# Patient Record
Sex: Male | Born: 1960
Health system: Southern US, Community
[De-identification: ages and names within clinical notes are randomized; demographics above are authoritative.]

## PROBLEM LIST (undated history)

## (undated) DIAGNOSIS — R87619 Unspecified abnormal cytological findings in specimens from cervix uteri: Secondary | ICD-10-CM

## (undated) DIAGNOSIS — J449 Chronic obstructive pulmonary disease, unspecified: Secondary | ICD-10-CM

## (undated) DIAGNOSIS — Z21 Asymptomatic human immunodeficiency virus [HIV] infection status: Secondary | ICD-10-CM

## (undated) DIAGNOSIS — F1910 Other psychoactive substance abuse, uncomplicated: Secondary | ICD-10-CM

## (undated) DIAGNOSIS — F1021 Alcohol dependence, in remission: Secondary | ICD-10-CM

## (undated) DIAGNOSIS — F329 Major depressive disorder, single episode, unspecified: Secondary | ICD-10-CM

## (undated) DIAGNOSIS — IMO0002 Reserved for concepts with insufficient information to code with codable children: Secondary | ICD-10-CM

## (undated) DIAGNOSIS — D649 Anemia, unspecified: Secondary | ICD-10-CM

## (undated) DIAGNOSIS — L309 Dermatitis, unspecified: Secondary | ICD-10-CM

## (undated) DIAGNOSIS — F32A Depression, unspecified: Secondary | ICD-10-CM

## (undated) DIAGNOSIS — R0789 Other chest pain: Secondary | ICD-10-CM

## (undated) DIAGNOSIS — C801 Malignant (primary) neoplasm, unspecified: Secondary | ICD-10-CM

## (undated) DIAGNOSIS — G709 Myoneural disorder, unspecified: Secondary | ICD-10-CM

## (undated) DIAGNOSIS — B20 Human immunodeficiency virus [HIV] disease: Secondary | ICD-10-CM

## (undated) DIAGNOSIS — R011 Cardiac murmur, unspecified: Secondary | ICD-10-CM

## (undated) HISTORY — DX: Dermatitis, unspecified: L30.9

## (undated) HISTORY — DX: Myoneural disorder, unspecified: G70.9

## (undated) HISTORY — DX: Major depressive disorder, single episode, unspecified: F32.9

## (undated) HISTORY — DX: Malignant (primary) neoplasm, unspecified: C80.1

## (undated) HISTORY — DX: Reserved for concepts with insufficient information to code with codable children: IMO0002

## (undated) HISTORY — DX: Depression, unspecified: F32.A

## (undated) HISTORY — DX: Other psychoactive substance abuse, uncomplicated: F19.10

## (undated) HISTORY — DX: Alcohol dependence, in remission: F10.21

## (undated) HISTORY — DX: Anemia, unspecified: D64.9

## (undated) HISTORY — PX: ANOSCOPY: SHX299

## (undated) HISTORY — DX: Unspecified abnormal cytological findings in specimens from cervix uteri: R87.619

## (undated) HISTORY — DX: Other chest pain: R07.89

---

## 2002-12-01 ENCOUNTER — Encounter (INDEPENDENT_AMBULATORY_CARE_PROVIDER_SITE_OTHER): Payer: Self-pay | Admitting: *Deleted

## 2002-12-01 ENCOUNTER — Encounter: Admission: RE | Admit: 2002-12-01 | Discharge: 2002-12-01 | Payer: Self-pay | Admitting: Infectious Diseases

## 2002-12-01 ENCOUNTER — Ambulatory Visit (HOSPITAL_COMMUNITY): Admission: RE | Admit: 2002-12-01 | Discharge: 2002-12-01 | Payer: Self-pay | Admitting: Infectious Diseases

## 2002-12-01 ENCOUNTER — Encounter (INDEPENDENT_AMBULATORY_CARE_PROVIDER_SITE_OTHER): Payer: Self-pay | Admitting: Infectious Diseases

## 2002-12-01 LAB — CONVERTED CEMR LAB
CD4 Count: 190 microliters
CD4 T Cell Abs: 190

## 2002-12-08 ENCOUNTER — Encounter: Admission: RE | Admit: 2002-12-08 | Discharge: 2002-12-08 | Payer: Self-pay | Admitting: Infectious Diseases

## 2002-12-08 ENCOUNTER — Ambulatory Visit (HOSPITAL_COMMUNITY): Admission: RE | Admit: 2002-12-08 | Discharge: 2002-12-08 | Payer: Self-pay | Admitting: Infectious Diseases

## 2002-12-08 ENCOUNTER — Encounter (INDEPENDENT_AMBULATORY_CARE_PROVIDER_SITE_OTHER): Payer: Self-pay | Admitting: Infectious Diseases

## 2003-01-05 ENCOUNTER — Encounter (INDEPENDENT_AMBULATORY_CARE_PROVIDER_SITE_OTHER): Payer: Self-pay | Admitting: Infectious Diseases

## 2003-01-05 ENCOUNTER — Encounter: Admission: RE | Admit: 2003-01-05 | Discharge: 2003-01-05 | Payer: Self-pay | Admitting: Infectious Diseases

## 2003-01-05 ENCOUNTER — Ambulatory Visit (HOSPITAL_COMMUNITY): Admission: RE | Admit: 2003-01-05 | Discharge: 2003-01-05 | Payer: Self-pay | Admitting: Infectious Diseases

## 2003-01-07 ENCOUNTER — Encounter: Admission: RE | Admit: 2003-01-07 | Discharge: 2003-01-07 | Payer: Self-pay | Admitting: Infectious Diseases

## 2003-01-16 ENCOUNTER — Encounter: Admission: RE | Admit: 2003-01-16 | Discharge: 2003-01-16 | Payer: Self-pay | Admitting: Infectious Diseases

## 2003-01-30 ENCOUNTER — Emergency Department (HOSPITAL_COMMUNITY): Admission: AD | Admit: 2003-01-30 | Discharge: 2003-01-30 | Payer: Self-pay | Admitting: Family Medicine

## 2003-03-11 ENCOUNTER — Encounter (INDEPENDENT_AMBULATORY_CARE_PROVIDER_SITE_OTHER): Payer: Self-pay | Admitting: *Deleted

## 2003-03-11 ENCOUNTER — Encounter: Admission: RE | Admit: 2003-03-11 | Discharge: 2003-03-11 | Payer: Self-pay | Admitting: Internal Medicine

## 2003-03-11 ENCOUNTER — Encounter: Payer: Self-pay | Admitting: Infectious Diseases

## 2003-03-11 ENCOUNTER — Ambulatory Visit (HOSPITAL_COMMUNITY): Admission: RE | Admit: 2003-03-11 | Discharge: 2003-03-11 | Payer: Self-pay | Admitting: Infectious Diseases

## 2003-05-06 ENCOUNTER — Encounter: Admission: RE | Admit: 2003-05-06 | Discharge: 2003-05-06 | Payer: Self-pay | Admitting: Infectious Diseases

## 2003-06-04 ENCOUNTER — Ambulatory Visit (HOSPITAL_COMMUNITY): Admission: RE | Admit: 2003-06-04 | Discharge: 2003-06-04 | Payer: Self-pay | Admitting: Infectious Diseases

## 2003-06-04 ENCOUNTER — Encounter: Admission: RE | Admit: 2003-06-04 | Discharge: 2003-06-04 | Payer: Self-pay | Admitting: Infectious Diseases

## 2003-06-04 ENCOUNTER — Encounter (INDEPENDENT_AMBULATORY_CARE_PROVIDER_SITE_OTHER): Payer: Self-pay | Admitting: *Deleted

## 2004-03-26 ENCOUNTER — Emergency Department (HOSPITAL_COMMUNITY): Admission: EM | Admit: 2004-03-26 | Discharge: 2004-03-26 | Payer: Self-pay | Admitting: Emergency Medicine

## 2004-04-01 ENCOUNTER — Emergency Department (HOSPITAL_COMMUNITY): Admission: EM | Admit: 2004-04-01 | Discharge: 2004-04-01 | Payer: Self-pay | Admitting: Emergency Medicine

## 2005-03-08 ENCOUNTER — Ambulatory Visit: Payer: Self-pay | Admitting: Infectious Diseases

## 2005-03-08 ENCOUNTER — Encounter (INDEPENDENT_AMBULATORY_CARE_PROVIDER_SITE_OTHER): Payer: Self-pay | Admitting: *Deleted

## 2005-03-08 ENCOUNTER — Ambulatory Visit (HOSPITAL_COMMUNITY): Admission: RE | Admit: 2005-03-08 | Discharge: 2005-03-08 | Payer: Self-pay | Admitting: Infectious Diseases

## 2005-04-10 ENCOUNTER — Ambulatory Visit: Payer: Self-pay | Admitting: Infectious Diseases

## 2006-01-13 DIAGNOSIS — B20 Human immunodeficiency virus [HIV] disease: Secondary | ICD-10-CM | POA: Insufficient documentation

## 2006-05-07 ENCOUNTER — Encounter (INDEPENDENT_AMBULATORY_CARE_PROVIDER_SITE_OTHER): Payer: Self-pay | Admitting: *Deleted

## 2006-05-07 LAB — CONVERTED CEMR LAB

## 2006-05-18 ENCOUNTER — Telehealth (INDEPENDENT_AMBULATORY_CARE_PROVIDER_SITE_OTHER): Payer: Self-pay | Admitting: Infectious Diseases

## 2006-05-20 ENCOUNTER — Encounter (INDEPENDENT_AMBULATORY_CARE_PROVIDER_SITE_OTHER): Payer: Self-pay | Admitting: *Deleted

## 2006-06-26 ENCOUNTER — Encounter (INDEPENDENT_AMBULATORY_CARE_PROVIDER_SITE_OTHER): Payer: Self-pay | Admitting: *Deleted

## 2006-07-09 ENCOUNTER — Telehealth (INDEPENDENT_AMBULATORY_CARE_PROVIDER_SITE_OTHER): Payer: Self-pay | Admitting: Infectious Diseases

## 2006-09-17 ENCOUNTER — Encounter (INDEPENDENT_AMBULATORY_CARE_PROVIDER_SITE_OTHER): Payer: Self-pay | Admitting: *Deleted

## 2006-12-10 ENCOUNTER — Ambulatory Visit: Payer: Self-pay | Admitting: Infectious Disease

## 2006-12-10 ENCOUNTER — Encounter: Admission: RE | Admit: 2006-12-10 | Discharge: 2006-12-10 | Payer: Self-pay | Admitting: Infectious Disease

## 2006-12-10 DIAGNOSIS — F329 Major depressive disorder, single episode, unspecified: Secondary | ICD-10-CM

## 2006-12-10 DIAGNOSIS — C460 Kaposi's sarcoma of skin: Secondary | ICD-10-CM

## 2006-12-10 LAB — CONVERTED CEMR LAB
AST: 31 units/L (ref 0–37)
Albumin: 4.1 g/dL (ref 3.5–5.2)
BUN: 18 mg/dL (ref 6–23)
Basophils Relative: 0 % (ref 0–1)
CO2: 24 meq/L (ref 19–32)
Calcium: 9.3 mg/dL (ref 8.4–10.5)
Chloride: 103 meq/L (ref 96–112)
Creatinine, Ser: 0.96 mg/dL (ref 0.40–1.50)
Lymphocytes Relative: 64 % — ABNORMAL HIGH (ref 12–46)
MCHC: 32.7 g/dL (ref 30.0–36.0)
MCV: 98.5 fL (ref 78.0–100.0)
Potassium: 3.7 meq/L (ref 3.5–5.3)
RBC: 3.97 M/uL — ABNORMAL LOW (ref 4.22–5.81)
RDW: 12.8 % (ref 11.5–14.0)
Total Bilirubin: 1.2 mg/dL (ref 0.3–1.2)
WBC: 2.7 10*3/uL — ABNORMAL LOW (ref 4.0–10.5)

## 2006-12-13 ENCOUNTER — Ambulatory Visit (HOSPITAL_COMMUNITY): Admission: RE | Admit: 2006-12-13 | Discharge: 2006-12-13 | Payer: Self-pay | Admitting: Infectious Disease

## 2006-12-14 ENCOUNTER — Telehealth: Payer: Self-pay | Admitting: Infectious Disease

## 2006-12-24 ENCOUNTER — Telehealth: Payer: Self-pay

## 2007-01-10 ENCOUNTER — Ambulatory Visit: Payer: Self-pay | Admitting: Infectious Diseases

## 2007-01-10 ENCOUNTER — Encounter: Admission: RE | Admit: 2007-01-10 | Discharge: 2007-01-10 | Payer: Self-pay | Admitting: Infectious Diseases

## 2007-01-10 LAB — CONVERTED CEMR LAB: HIV-1 RNA Quant, Log: 3.15 — ABNORMAL HIGH (ref <1.70)

## 2007-01-14 ENCOUNTER — Telehealth: Payer: Self-pay | Admitting: Infectious Disease

## 2007-01-24 ENCOUNTER — Ambulatory Visit: Payer: Self-pay | Admitting: Infectious Disease

## 2007-01-24 DIAGNOSIS — L299 Pruritus, unspecified: Secondary | ICD-10-CM

## 2007-01-24 DIAGNOSIS — R197 Diarrhea, unspecified: Secondary | ICD-10-CM

## 2007-01-28 ENCOUNTER — Telehealth: Payer: Self-pay | Admitting: Infectious Disease

## 2007-01-31 ENCOUNTER — Ambulatory Visit: Payer: Self-pay | Admitting: Infectious Disease

## 2007-02-13 ENCOUNTER — Telehealth: Payer: Self-pay | Admitting: Infectious Disease

## 2007-02-18 ENCOUNTER — Telehealth: Payer: Self-pay | Admitting: Infectious Disease

## 2007-03-11 ENCOUNTER — Encounter: Admission: RE | Admit: 2007-03-11 | Discharge: 2007-03-11 | Payer: Self-pay | Admitting: Infectious Disease

## 2007-03-11 ENCOUNTER — Ambulatory Visit: Payer: Self-pay | Admitting: Infectious Disease

## 2007-03-11 LAB — CONVERTED CEMR LAB
Albumin: 4.3 g/dL (ref 3.5–5.2)
BUN: 22 mg/dL (ref 6–23)
Basophils Absolute: 0 10*3/uL (ref 0.0–0.1)
Basophils Relative: 0 % (ref 0–1)
CO2: 26 meq/L (ref 19–32)
Chloride: 105 meq/L (ref 96–112)
Creatinine, Ser: 1.14 mg/dL (ref 0.40–1.50)
Eosinophils Absolute: 0 10*3/uL (ref 0.0–0.7)
Glucose, Bld: 88 mg/dL (ref 70–99)
HCT: 37.8 % — ABNORMAL LOW (ref 39.0–52.0)
HDL: 78 mg/dL (ref >39)
HIV 1 RNA Quant: 240 copies/mL — ABNORMAL HIGH (ref <50)
HIV-1 RNA Quant, Log: 2.38 — ABNORMAL HIGH (ref <1.70)
Hemoglobin: 12.8 g/dL — ABNORMAL LOW (ref 13.0–17.0)
LDL Cholesterol: 128 mg/dL — ABNORMAL HIGH (ref 0–99)
Lymphs Abs: 1.3 10*3/uL (ref 0.7–4.0)
MCHC: 33.9 g/dL (ref 30.0–36.0)
MCV: 99.5 fL (ref 78.0–100.0)
Monocytes Relative: 8 % (ref 3–12)
Neutrophils Relative %: 36 % — ABNORMAL LOW (ref 43–77)
Platelets: 214 10*3/uL (ref 150–400)
Potassium: 4.6 meq/L (ref 3.5–5.3)
RBC: 3.8 M/uL — ABNORMAL LOW (ref 4.22–5.81)
RDW: 13.2 % (ref 11.5–15.5)
Triglycerides: 128 mg/dL (ref <150)

## 2007-03-13 ENCOUNTER — Encounter (INDEPENDENT_AMBULATORY_CARE_PROVIDER_SITE_OTHER): Payer: Self-pay | Admitting: *Deleted

## 2007-03-22 ENCOUNTER — Telehealth: Payer: Self-pay | Admitting: Infectious Disease

## 2007-04-01 ENCOUNTER — Ambulatory Visit: Payer: Self-pay | Admitting: Infectious Disease

## 2007-04-01 DIAGNOSIS — R0989 Other specified symptoms and signs involving the circulatory and respiratory systems: Secondary | ICD-10-CM

## 2007-04-01 DIAGNOSIS — R0609 Other forms of dyspnea: Secondary | ICD-10-CM

## 2007-04-23 ENCOUNTER — Telehealth: Payer: Self-pay | Admitting: Infectious Disease

## 2007-05-03 ENCOUNTER — Encounter (INDEPENDENT_AMBULATORY_CARE_PROVIDER_SITE_OTHER): Payer: Self-pay | Admitting: *Deleted

## 2007-05-23 ENCOUNTER — Telehealth: Payer: Self-pay | Admitting: Infectious Disease

## 2007-05-24 ENCOUNTER — Encounter (INDEPENDENT_AMBULATORY_CARE_PROVIDER_SITE_OTHER): Payer: Self-pay | Admitting: *Deleted

## 2007-06-18 ENCOUNTER — Telehealth (INDEPENDENT_AMBULATORY_CARE_PROVIDER_SITE_OTHER): Payer: Self-pay | Admitting: *Deleted

## 2007-07-01 ENCOUNTER — Encounter: Admission: RE | Admit: 2007-07-01 | Discharge: 2007-07-01 | Payer: Self-pay | Admitting: Infectious Disease

## 2007-07-01 ENCOUNTER — Ambulatory Visit: Payer: Self-pay | Admitting: Infectious Disease

## 2007-07-01 LAB — CONVERTED CEMR LAB
ALT: 21 units/L (ref 0–53)
AST: 32 units/L (ref 0–37)
Albumin: 4.4 g/dL (ref 3.5–5.2)
BUN: 17 mg/dL (ref 6–23)
Basophils Absolute: 0 10*3/uL (ref 0.0–0.1)
Basophils Relative: 0 % (ref 0–1)
CO2: 27 meq/L (ref 19–32)
Calcium: 9.7 mg/dL (ref 8.4–10.5)
Creatinine, Ser: 1.22 mg/dL (ref 0.40–1.50)
Eosinophils Absolute: 0 10*3/uL (ref 0.0–0.7)
Eosinophils Relative: 0 % (ref 0–5)
Glucose, Bld: 92 mg/dL (ref 70–99)
HCT: 36 % — ABNORMAL LOW (ref 39.0–52.0)
HDL: 93 mg/dL (ref >39)
Neutrophils Relative %: 46 % (ref 43–77)
Potassium: 4.4 meq/L (ref 3.5–5.3)
RDW: 13.2 % (ref 11.5–15.5)
Total Protein: 8.5 g/dL — ABNORMAL HIGH (ref 6.0–8.3)
VLDL: 8 mg/dL (ref 0–40)
WBC: 2.9 10*3/uL — ABNORMAL LOW (ref 4.0–10.5)

## 2007-07-11 ENCOUNTER — Telehealth (INDEPENDENT_AMBULATORY_CARE_PROVIDER_SITE_OTHER): Payer: Self-pay | Admitting: *Deleted

## 2007-07-12 ENCOUNTER — Telehealth: Payer: Self-pay | Admitting: Infectious Disease

## 2007-07-17 ENCOUNTER — Encounter: Payer: Self-pay | Admitting: Infectious Disease

## 2007-07-22 ENCOUNTER — Ambulatory Visit: Payer: Self-pay | Admitting: Infectious Disease

## 2007-07-22 DIAGNOSIS — R609 Edema, unspecified: Secondary | ICD-10-CM

## 2007-07-22 DIAGNOSIS — L719 Rosacea, unspecified: Secondary | ICD-10-CM | POA: Insufficient documentation

## 2007-07-24 ENCOUNTER — Telehealth: Payer: Self-pay | Admitting: Infectious Disease

## 2007-07-25 ENCOUNTER — Telehealth: Payer: Self-pay | Admitting: Infectious Disease

## 2007-07-25 ENCOUNTER — Encounter: Payer: Self-pay | Admitting: Infectious Disease

## 2007-07-29 ENCOUNTER — Telehealth (INDEPENDENT_AMBULATORY_CARE_PROVIDER_SITE_OTHER): Payer: Self-pay | Admitting: *Deleted

## 2007-08-02 ENCOUNTER — Encounter: Payer: Self-pay | Admitting: Infectious Disease

## 2007-08-02 ENCOUNTER — Ambulatory Visit (HOSPITAL_COMMUNITY): Admission: RE | Admit: 2007-08-02 | Discharge: 2007-08-02 | Payer: Self-pay | Admitting: Infectious Disease

## 2007-08-02 ENCOUNTER — Ambulatory Visit: Payer: Self-pay | Admitting: Surgery

## 2007-08-16 ENCOUNTER — Telehealth (INDEPENDENT_AMBULATORY_CARE_PROVIDER_SITE_OTHER): Payer: Self-pay | Admitting: *Deleted

## 2007-09-10 ENCOUNTER — Telehealth (INDEPENDENT_AMBULATORY_CARE_PROVIDER_SITE_OTHER): Payer: Self-pay | Admitting: *Deleted

## 2007-09-17 ENCOUNTER — Telehealth (INDEPENDENT_AMBULATORY_CARE_PROVIDER_SITE_OTHER): Payer: Self-pay | Admitting: *Deleted

## 2007-10-01 ENCOUNTER — Telehealth: Payer: Self-pay | Admitting: Infectious Disease

## 2007-10-17 ENCOUNTER — Telehealth (INDEPENDENT_AMBULATORY_CARE_PROVIDER_SITE_OTHER): Payer: Self-pay | Admitting: *Deleted

## 2007-11-08 ENCOUNTER — Emergency Department (HOSPITAL_COMMUNITY): Admission: EM | Admit: 2007-11-08 | Discharge: 2007-11-08 | Payer: Self-pay | Admitting: Emergency Medicine

## 2007-11-12 ENCOUNTER — Telehealth (INDEPENDENT_AMBULATORY_CARE_PROVIDER_SITE_OTHER): Payer: Self-pay | Admitting: *Deleted

## 2007-12-02 ENCOUNTER — Ambulatory Visit: Payer: Self-pay | Admitting: Infectious Disease

## 2007-12-02 LAB — CONVERTED CEMR LAB
AST: 39 units/L — ABNORMAL HIGH (ref 0–37)
Alkaline Phosphatase: 84 units/L (ref 39–117)
Basophils Absolute: 0 10*3/uL (ref 0.0–0.1)
Basophils Relative: 0 % (ref 0–1)
Calcium: 8.9 mg/dL (ref 8.4–10.5)
Eosinophils Absolute: 0 10*3/uL (ref 0.0–0.7)
Eosinophils Relative: 1 % (ref 0–5)
HIV 1 RNA Quant: 357 copies/mL — ABNORMAL HIGH (ref <50)
HIV-1 RNA Quant, Log: 2.55 — ABNORMAL HIGH (ref <1.70)
LDL Cholesterol: 149 mg/dL — ABNORMAL HIGH (ref 0–99)
Lymphocytes Relative: 28 % (ref 12–46)
Lymphs Abs: 1 10*3/uL (ref 0.7–4.0)
MCHC: 31.7 g/dL (ref 30.0–36.0)
Monocytes Absolute: 0.3 10*3/uL (ref 0.1–1.0)
Neutro Abs: 2.3 10*3/uL (ref 1.7–7.7)
Neutrophils Relative %: 64 % (ref 43–77)
Platelets: 248 10*3/uL (ref 150–400)
Potassium: 4.3 meq/L (ref 3.5–5.3)
RDW: 12.8 % (ref 11.5–15.5)
Sodium: 138 meq/L (ref 135–145)
Total Protein: 8.1 g/dL (ref 6.0–8.3)
Triglycerides: 98 mg/dL (ref <150)
VLDL: 20 mg/dL (ref 0–40)
WBC: 3.6 10*3/uL — ABNORMAL LOW (ref 4.0–10.5)

## 2007-12-16 ENCOUNTER — Telehealth (INDEPENDENT_AMBULATORY_CARE_PROVIDER_SITE_OTHER): Payer: Self-pay | Admitting: *Deleted

## 2008-01-13 ENCOUNTER — Telehealth (INDEPENDENT_AMBULATORY_CARE_PROVIDER_SITE_OTHER): Payer: Self-pay | Admitting: *Deleted

## 2008-02-13 ENCOUNTER — Telehealth (INDEPENDENT_AMBULATORY_CARE_PROVIDER_SITE_OTHER): Payer: Self-pay | Admitting: *Deleted

## 2008-03-04 ENCOUNTER — Ambulatory Visit: Payer: Self-pay | Admitting: Infectious Disease

## 2008-03-04 LAB — CONVERTED CEMR LAB
ALT: 21 units/L (ref 0–53)
Albumin: 4.2 g/dL (ref 3.5–5.2)
Basophils Relative: 1 % (ref 0–1)
CO2: 25 meq/L (ref 19–32)
Calcium: 9.4 mg/dL (ref 8.4–10.5)
Chloride: 102 meq/L (ref 96–112)
Creatinine, Ser: 1.09 mg/dL (ref 0.40–1.50)
Eosinophils Absolute: 0 10*3/uL (ref 0.0–0.7)
Eosinophils Relative: 1 % (ref 0–5)
Glucose, Bld: 81 mg/dL (ref 70–99)
HCT: 39.3 % (ref 39.0–52.0)
Hemoglobin: 13.1 g/dL (ref 13.0–17.0)
Lymphocytes Relative: 52 % — ABNORMAL HIGH (ref 12–46)
MCHC: 33.3 g/dL (ref 30.0–36.0)
MCV: 100.5 fL — ABNORMAL HIGH (ref 78.0–100.0)
Monocytes Relative: 10 % (ref 3–12)
Potassium: 4.6 meq/L (ref 3.5–5.3)
RDW: 13 % (ref 11.5–15.5)
Total Protein: 8 g/dL (ref 6.0–8.3)

## 2008-03-11 ENCOUNTER — Telehealth (INDEPENDENT_AMBULATORY_CARE_PROVIDER_SITE_OTHER): Payer: Self-pay | Admitting: *Deleted

## 2008-03-16 ENCOUNTER — Ambulatory Visit: Payer: Self-pay | Admitting: Infectious Disease

## 2008-03-16 DIAGNOSIS — D72819 Decreased white blood cell count, unspecified: Secondary | ICD-10-CM

## 2008-03-16 LAB — CONVERTED CEMR LAB: Chlamydia, Swab/Urine, PCR: NEGATIVE

## 2008-04-01 ENCOUNTER — Ambulatory Visit: Payer: Self-pay | Admitting: Internal Medicine

## 2008-04-08 ENCOUNTER — Encounter: Payer: Self-pay | Admitting: Infectious Disease

## 2008-04-08 LAB — CBC WITH DIFFERENTIAL/PLATELET
Eosinophils Absolute: 0 10*3/uL (ref 0.0–0.5)
MCV: 100.3 fL — ABNORMAL HIGH (ref 81.6–98.0)
MONO%: 10.3 % (ref 0.0–13.0)
NEUT#: 0.8 10*3/uL — ABNORMAL LOW (ref 1.5–6.5)
RBC: 3.6 10*6/uL — ABNORMAL LOW (ref 4.20–5.71)
RDW: 13.2 % (ref 11.2–14.6)
WBC: 2.5 10*3/uL — ABNORMAL LOW (ref 4.0–10.0)

## 2008-04-08 LAB — COMPREHENSIVE METABOLIC PANEL
AST: 27 U/L (ref 0–37)
Albumin: 4.3 g/dL (ref 3.5–5.2)
Alkaline Phosphatase: 93 U/L (ref 39–117)
Glucose, Bld: 86 mg/dL (ref 70–99)
Potassium: 4.2 mEq/L (ref 3.5–5.3)
Sodium: 135 mEq/L (ref 135–145)
Total Protein: 8.2 g/dL (ref 6.0–8.3)

## 2008-04-10 ENCOUNTER — Telehealth (INDEPENDENT_AMBULATORY_CARE_PROVIDER_SITE_OTHER): Payer: Self-pay | Admitting: *Deleted

## 2008-04-16 ENCOUNTER — Ambulatory Visit (HOSPITAL_COMMUNITY): Admission: RE | Admit: 2008-04-16 | Discharge: 2008-04-16 | Payer: Self-pay | Admitting: Internal Medicine

## 2008-04-16 ENCOUNTER — Encounter (INDEPENDENT_AMBULATORY_CARE_PROVIDER_SITE_OTHER): Payer: Self-pay | Admitting: Interventional Radiology

## 2008-04-22 ENCOUNTER — Encounter: Payer: Self-pay | Admitting: Infectious Disease

## 2008-05-07 ENCOUNTER — Telehealth (INDEPENDENT_AMBULATORY_CARE_PROVIDER_SITE_OTHER): Payer: Self-pay | Admitting: *Deleted

## 2008-06-03 ENCOUNTER — Telehealth (INDEPENDENT_AMBULATORY_CARE_PROVIDER_SITE_OTHER): Payer: Self-pay | Admitting: *Deleted

## 2008-06-04 ENCOUNTER — Encounter (INDEPENDENT_AMBULATORY_CARE_PROVIDER_SITE_OTHER): Payer: Self-pay | Admitting: *Deleted

## 2008-06-17 ENCOUNTER — Ambulatory Visit: Payer: Self-pay | Admitting: Infectious Disease

## 2008-06-17 LAB — CONVERTED CEMR LAB
Albumin: 4.4 g/dL (ref 3.5–5.2)
Alkaline Phosphatase: 97 units/L (ref 39–117)
BUN: 17 mg/dL (ref 6–23)
Basophils Absolute: 0 10*3/uL (ref 0.0–0.1)
Basophils Relative: 0 % (ref 0–1)
Chloride: 103 meq/L (ref 96–112)
Creatinine, Ser: 1.22 mg/dL (ref 0.40–1.50)
Eosinophils Absolute: 0 10*3/uL (ref 0.0–0.7)
Eosinophils Relative: 1 % (ref 0–5)
Glucose, Bld: 91 mg/dL (ref 70–99)
HDL: 80 mg/dL (ref >39)
Lymphocytes Relative: 47 % — ABNORMAL HIGH (ref 12–46)
Lymphs Abs: 1.2 10*3/uL (ref 0.7–4.0)
MCHC: 34.4 g/dL (ref 30.0–36.0)
Monocytes Absolute: 0.4 10*3/uL (ref 0.1–1.0)
Neutro Abs: 1 10*3/uL — ABNORMAL LOW (ref 1.7–7.7)
Neutrophils Relative %: 38 % — ABNORMAL LOW (ref 43–77)
Potassium: 4.3 meq/L (ref 3.5–5.3)
RBC: 3.54 M/uL — ABNORMAL LOW (ref 4.22–5.81)
Sodium: 140 meq/L (ref 135–145)
Total Protein: 8 g/dL (ref 6.0–8.3)
Triglycerides: 71 mg/dL (ref <150)
VLDL: 14 mg/dL (ref 0–40)

## 2008-06-19 ENCOUNTER — Ambulatory Visit: Payer: Self-pay | Admitting: Internal Medicine

## 2008-06-19 ENCOUNTER — Encounter: Payer: Self-pay | Admitting: Infectious Disease

## 2008-06-19 DIAGNOSIS — L0293 Carbuncle, unspecified: Secondary | ICD-10-CM

## 2008-06-19 DIAGNOSIS — L0292 Furuncle, unspecified: Secondary | ICD-10-CM | POA: Insufficient documentation

## 2008-06-23 ENCOUNTER — Encounter (INDEPENDENT_AMBULATORY_CARE_PROVIDER_SITE_OTHER): Payer: Self-pay | Admitting: *Deleted

## 2008-07-03 ENCOUNTER — Telehealth (INDEPENDENT_AMBULATORY_CARE_PROVIDER_SITE_OTHER): Payer: Self-pay | Admitting: *Deleted

## 2008-08-05 ENCOUNTER — Telehealth (INDEPENDENT_AMBULATORY_CARE_PROVIDER_SITE_OTHER): Payer: Self-pay | Admitting: *Deleted

## 2008-09-08 ENCOUNTER — Telehealth (INDEPENDENT_AMBULATORY_CARE_PROVIDER_SITE_OTHER): Payer: Self-pay | Admitting: *Deleted

## 2008-09-09 ENCOUNTER — Ambulatory Visit: Payer: Self-pay | Admitting: Infectious Disease

## 2008-09-09 LAB — CONVERTED CEMR LAB
ALT: 19 units/L (ref 0–53)
Albumin: 4.2 g/dL (ref 3.5–5.2)
BUN: 15 mg/dL (ref 6–23)
Basophils Relative: 1 % (ref 0–1)
CO2: 27 meq/L (ref 19–32)
Calcium: 9.2 mg/dL (ref 8.4–10.5)
Creatinine, Ser: 1.35 mg/dL (ref 0.40–1.50)
Eosinophils Absolute: 0 10*3/uL (ref 0.0–0.7)
Eosinophils Relative: 1 % (ref 0–5)
Glucose, Bld: 80 mg/dL (ref 70–99)
HIV 1 RNA Quant: 222 copies/mL — ABNORMAL HIGH (ref <48)
MCHC: 32.7 g/dL (ref 30.0–36.0)
MCV: 100.5 fL — ABNORMAL HIGH (ref 78.0–100.0)
Monocytes Absolute: 0.2 10*3/uL (ref 0.1–1.0)
Neutrophils Relative %: 28 % — ABNORMAL LOW (ref 43–77)
RDW: 12.9 % (ref 11.5–15.5)
Total Bilirubin: 0.4 mg/dL (ref 0.3–1.2)

## 2008-09-21 ENCOUNTER — Ambulatory Visit: Payer: Self-pay | Admitting: Infectious Disease

## 2008-09-21 DIAGNOSIS — N62 Hypertrophy of breast: Secondary | ICD-10-CM

## 2008-09-24 ENCOUNTER — Encounter: Admission: RE | Admit: 2008-09-24 | Discharge: 2008-09-24 | Payer: Self-pay | Admitting: Infectious Disease

## 2008-09-25 ENCOUNTER — Telehealth (INDEPENDENT_AMBULATORY_CARE_PROVIDER_SITE_OTHER): Payer: Self-pay | Admitting: *Deleted

## 2008-10-11 LAB — CONVERTED CEMR LAB
Free T4: 1.12 ng/dL (ref 0.80–1.80)
Testosterone: 360.68 ng/dL (ref 350–890)
Vit D, 25-Hydroxy: 9 ng/mL — ABNORMAL LOW (ref 30–89)

## 2008-10-22 ENCOUNTER — Telehealth (INDEPENDENT_AMBULATORY_CARE_PROVIDER_SITE_OTHER): Payer: Self-pay | Admitting: *Deleted

## 2008-11-04 ENCOUNTER — Ambulatory Visit: Payer: Self-pay | Admitting: Infectious Disease

## 2008-11-04 LAB — CONVERTED CEMR LAB
AST: 28 units/L (ref 0–37)
Albumin: 4.4 g/dL (ref 3.5–5.2)
CO2: 25 meq/L (ref 19–32)
Eosinophils Absolute: 0 10*3/uL (ref 0.0–0.7)
Lymphocytes Relative: 51 % — ABNORMAL HIGH (ref 12–46)
MCHC: 32.7 g/dL (ref 30.0–36.0)
Monocytes Absolute: 0.4 10*3/uL (ref 0.1–1.0)
Neutro Abs: 1.1 10*3/uL — ABNORMAL LOW (ref 1.7–7.7)
Neutrophils Relative %: 37 % — ABNORMAL LOW (ref 43–77)
Platelets: 270 10*3/uL (ref 150–400)
RBC: 3.89 M/uL — ABNORMAL LOW (ref 4.22–5.81)
RDW: 13.1 % (ref 11.5–15.5)
Sodium: 139 meq/L (ref 135–145)
Total Bilirubin: 0.7 mg/dL (ref 0.3–1.2)

## 2008-11-19 ENCOUNTER — Telehealth (INDEPENDENT_AMBULATORY_CARE_PROVIDER_SITE_OTHER): Payer: Self-pay | Admitting: *Deleted

## 2008-11-23 ENCOUNTER — Ambulatory Visit: Payer: Self-pay | Admitting: Infectious Disease

## 2008-11-23 DIAGNOSIS — D539 Nutritional anemia, unspecified: Secondary | ICD-10-CM | POA: Insufficient documentation

## 2008-11-23 DIAGNOSIS — R351 Nocturia: Secondary | ICD-10-CM

## 2008-11-23 DIAGNOSIS — R358 Other polyuria: Secondary | ICD-10-CM

## 2008-12-07 ENCOUNTER — Telehealth: Payer: Self-pay | Admitting: Infectious Disease

## 2008-12-07 ENCOUNTER — Ambulatory Visit: Payer: Self-pay | Admitting: Infectious Disease

## 2008-12-07 DIAGNOSIS — B029 Zoster without complications: Secondary | ICD-10-CM | POA: Insufficient documentation

## 2008-12-07 DIAGNOSIS — G500 Trigeminal neuralgia: Secondary | ICD-10-CM | POA: Insufficient documentation

## 2008-12-08 ENCOUNTER — Telehealth (INDEPENDENT_AMBULATORY_CARE_PROVIDER_SITE_OTHER): Payer: Self-pay | Admitting: *Deleted

## 2008-12-28 ENCOUNTER — Telehealth (INDEPENDENT_AMBULATORY_CARE_PROVIDER_SITE_OTHER): Payer: Self-pay | Admitting: *Deleted

## 2009-01-20 ENCOUNTER — Telehealth (INDEPENDENT_AMBULATORY_CARE_PROVIDER_SITE_OTHER): Payer: Self-pay | Admitting: *Deleted

## 2009-02-11 ENCOUNTER — Ambulatory Visit: Payer: Self-pay | Admitting: Psychiatry

## 2009-02-11 ENCOUNTER — Inpatient Hospital Stay (HOSPITAL_COMMUNITY): Admission: AD | Admit: 2009-02-11 | Discharge: 2009-02-19 | Payer: Self-pay | Admitting: Psychiatry

## 2009-02-11 ENCOUNTER — Telehealth: Payer: Self-pay

## 2009-02-12 ENCOUNTER — Telehealth: Payer: Self-pay

## 2009-02-13 ENCOUNTER — Ambulatory Visit (HOSPITAL_COMMUNITY): Admission: RE | Admit: 2009-02-13 | Discharge: 2009-02-13 | Payer: Self-pay | Admitting: Psychiatry

## 2009-02-18 ENCOUNTER — Telehealth (INDEPENDENT_AMBULATORY_CARE_PROVIDER_SITE_OTHER): Payer: Self-pay | Admitting: *Deleted

## 2009-02-22 ENCOUNTER — Telehealth: Payer: Self-pay | Admitting: Infectious Disease

## 2009-03-03 ENCOUNTER — Ambulatory Visit: Payer: Self-pay | Admitting: Infectious Disease

## 2009-03-03 LAB — CONVERTED CEMR LAB
AST: 40 units/L — ABNORMAL HIGH (ref 0–37)
Albumin: 4.1 g/dL (ref 3.5–5.2)
BUN: 14 mg/dL (ref 6–23)
Basophils Relative: 0 % (ref 0–1)
CO2: 24 meq/L (ref 19–32)
Calcium: 9.8 mg/dL (ref 8.4–10.5)
Cholesterol: 145 mg/dL (ref 0–200)
Creatinine, Ser: 1.19 mg/dL (ref 0.40–1.50)
Eosinophils Relative: 1 % (ref 0–5)
HDL: 42 mg/dL (ref >39)
Lymphs Abs: 1.5 10*3/uL (ref 0.7–4.0)
MCHC: 33.2 g/dL (ref 30.0–36.0)
MCV: 100.8 fL — ABNORMAL HIGH (ref >=78.0)
Monocytes Relative: 13 % — ABNORMAL HIGH (ref 3–12)
Platelets: 329 10*3/uL (ref 150–400)
Total Protein: 7.6 g/dL (ref 6.0–8.3)
Triglycerides: 55 mg/dL (ref <150)
VLDL: 11 mg/dL (ref 0–40)
WBC: 3.2 10*3/uL — ABNORMAL LOW (ref 4.0–10.5)

## 2009-03-17 ENCOUNTER — Telehealth (INDEPENDENT_AMBULATORY_CARE_PROVIDER_SITE_OTHER): Payer: Self-pay | Admitting: *Deleted

## 2009-03-22 ENCOUNTER — Ambulatory Visit: Payer: Self-pay | Admitting: Infectious Disease

## 2009-04-14 ENCOUNTER — Telehealth (INDEPENDENT_AMBULATORY_CARE_PROVIDER_SITE_OTHER): Payer: Self-pay | Admitting: *Deleted

## 2009-05-17 ENCOUNTER — Telehealth (INDEPENDENT_AMBULATORY_CARE_PROVIDER_SITE_OTHER): Payer: Self-pay | Admitting: *Deleted

## 2009-05-18 ENCOUNTER — Emergency Department (HOSPITAL_COMMUNITY): Admission: EM | Admit: 2009-05-18 | Discharge: 2009-05-18 | Payer: Self-pay | Admitting: Family Medicine

## 2009-05-21 ENCOUNTER — Ambulatory Visit: Payer: Self-pay | Admitting: Infectious Disease

## 2009-05-21 LAB — CONVERTED CEMR LAB
ALT: 14 U/L
AST: 21 U/L
Albumin: 4.4 g/dL
Alkaline Phosphatase: 66 U/L
BUN: 14 mg/dL
Basophils Absolute: 0 K/uL
Basophils Relative: 0 %
CO2: 25 meq/L
Calcium: 9.7 mg/dL
Chloride: 103 meq/L
Cholesterol: 174 mg/dL
Creatinine, Ser: 1.24 mg/dL
Eosinophils Absolute: 0 K/uL
Eosinophils Relative: 0 %
Glucose, Bld: 67 mg/dL — ABNORMAL LOW
HCT: 40.1 %
HDL: 51 mg/dL
HIV 1 RNA Quant: 689 copies/mL — ABNORMAL HIGH (ref <48)
HIV-1 RNA Quant, Log: 2.84 — ABNORMAL HIGH (ref <1.68)
Hemoglobin: 13.2 g/dL
LDL Cholesterol: 113 mg/dL — ABNORMAL HIGH
Lymphocytes Relative: 61 % — ABNORMAL HIGH
Lymphs Abs: 2.1 K/uL
MCHC: 32.9 g/dL
MCV: 97.8 fL
Monocytes Absolute: 0.4 K/uL
Monocytes Relative: 10 %
Neutro Abs: 1 K/uL — ABNORMAL LOW
Neutrophils Relative %: 29 % — ABNORMAL LOW
Platelets: 262 K/uL
Potassium: 4.2 meq/L
RBC: 4.1 M/uL — ABNORMAL LOW
RDW: 13.1 %
Sodium: 139 meq/L
Total Bilirubin: 0.6 mg/dL
Total CHOL/HDL Ratio: 3.4
Total Protein: 7.8 g/dL
Triglycerides: 51 mg/dL
VLDL: 10 mg/dL
WBC: 3.5 10*3/microliter — ABNORMAL LOW

## 2009-06-07 ENCOUNTER — Ambulatory Visit: Payer: Self-pay | Admitting: Infectious Disease

## 2009-06-07 DIAGNOSIS — F1024 Alcohol dependence with alcohol-induced mood disorder: Secondary | ICD-10-CM

## 2009-06-07 LAB — CONVERTED CEMR LAB: Chlamydia, Swab/Urine, PCR: NEGATIVE

## 2009-06-09 ENCOUNTER — Encounter: Payer: Self-pay | Admitting: Infectious Disease

## 2009-06-09 LAB — CONVERTED CEMR LAB

## 2009-06-15 ENCOUNTER — Telehealth (INDEPENDENT_AMBULATORY_CARE_PROVIDER_SITE_OTHER): Payer: Self-pay | Admitting: *Deleted

## 2009-06-21 ENCOUNTER — Encounter (INDEPENDENT_AMBULATORY_CARE_PROVIDER_SITE_OTHER): Payer: Self-pay | Admitting: *Deleted

## 2009-07-09 ENCOUNTER — Telehealth (INDEPENDENT_AMBULATORY_CARE_PROVIDER_SITE_OTHER): Payer: Self-pay | Admitting: *Deleted

## 2009-07-29 ENCOUNTER — Ambulatory Visit: Payer: Self-pay | Admitting: Infectious Disease

## 2009-07-29 LAB — CONVERTED CEMR LAB
ALT: 25 units/L (ref 0–53)
AST: 27 units/L (ref 0–37)
BUN: 11 mg/dL (ref 6–23)
Basophils Relative: 0 % (ref 0–1)
CO2: 29 meq/L (ref 19–32)
Calcium: 10.3 mg/dL (ref 8.4–10.5)
Chloride: 102 meq/L (ref 96–112)
Creatinine, Ser: 1.17 mg/dL (ref 0.40–1.50)
Glucose, Bld: 106 mg/dL — ABNORMAL HIGH (ref 70–99)
HIV-1 RNA Quant, Log: <1.68 (ref <1.68)
Platelets: 308 10*3/uL (ref 150–400)
RBC: 4.19 M/uL — ABNORMAL LOW (ref 4.22–5.81)
Sodium: 139 meq/L (ref 135–145)

## 2009-08-04 ENCOUNTER — Telehealth (INDEPENDENT_AMBULATORY_CARE_PROVIDER_SITE_OTHER): Payer: Self-pay | Admitting: *Deleted

## 2009-08-26 ENCOUNTER — Telehealth: Payer: Self-pay | Admitting: Infectious Disease

## 2009-09-03 ENCOUNTER — Telehealth (INDEPENDENT_AMBULATORY_CARE_PROVIDER_SITE_OTHER): Payer: Self-pay | Admitting: *Deleted

## 2009-09-08 ENCOUNTER — Encounter (INDEPENDENT_AMBULATORY_CARE_PROVIDER_SITE_OTHER): Payer: Self-pay | Admitting: *Deleted

## 2009-09-14 ENCOUNTER — Telehealth (INDEPENDENT_AMBULATORY_CARE_PROVIDER_SITE_OTHER): Payer: Self-pay | Admitting: *Deleted

## 2009-09-22 ENCOUNTER — Ambulatory Visit: Payer: Self-pay | Admitting: Infectious Disease

## 2009-09-22 LAB — CONVERTED CEMR LAB
AST: 21 units/L (ref 0–37)
BUN: 17 mg/dL (ref 6–23)
Basophils Relative: 0 % (ref 0–1)
Calcium: 9.5 mg/dL (ref 8.4–10.5)
Chloride: 104 meq/L (ref 96–112)
Cholesterol: 146 mg/dL (ref 0–200)
Creatinine, Ser: 1.39 mg/dL (ref 0.40–1.50)
Eosinophils Absolute: 0 10*3/uL (ref 0.0–0.7)
HDL: 45 mg/dL (ref >39)
HIV 1 RNA Quant: <48 copies/mL (ref <48)
Hemoglobin: 12.6 g/dL — ABNORMAL LOW (ref 13.0–17.0)
LDL Cholesterol: 87 mg/dL (ref 0–99)
Lymphocytes Relative: 56 % — ABNORMAL HIGH (ref 12–46)
Lymphs Abs: 1.6 10*3/uL (ref 0.7–4.0)
Monocytes Absolute: 0.3 10*3/uL (ref 0.1–1.0)
Monocytes Relative: 11 % (ref 3–12)
Neutrophils Relative %: 32 % — ABNORMAL LOW (ref 43–77)
Total CHOL/HDL Ratio: 3.2
Triglycerides: 68 mg/dL (ref <150)
WBC: 2.8 10*3/uL — ABNORMAL LOW (ref 4.0–10.5)

## 2009-10-06 ENCOUNTER — Ambulatory Visit: Payer: Self-pay | Admitting: Infectious Disease

## 2009-10-08 ENCOUNTER — Telehealth (INDEPENDENT_AMBULATORY_CARE_PROVIDER_SITE_OTHER): Payer: Self-pay | Admitting: *Deleted

## 2009-11-05 ENCOUNTER — Telehealth (INDEPENDENT_AMBULATORY_CARE_PROVIDER_SITE_OTHER): Payer: Self-pay | Admitting: *Deleted

## 2009-11-28 ENCOUNTER — Emergency Department (HOSPITAL_COMMUNITY): Admission: EM | Admit: 2009-11-28 | Discharge: 2009-11-28 | Payer: Self-pay | Admitting: Family Medicine

## 2009-12-03 ENCOUNTER — Telehealth (INDEPENDENT_AMBULATORY_CARE_PROVIDER_SITE_OTHER): Payer: Self-pay | Admitting: *Deleted

## 2009-12-29 ENCOUNTER — Encounter: Payer: Self-pay | Admitting: Infectious Disease

## 2009-12-29 ENCOUNTER — Telehealth (INDEPENDENT_AMBULATORY_CARE_PROVIDER_SITE_OTHER): Payer: Self-pay | Admitting: *Deleted

## 2009-12-29 ENCOUNTER — Ambulatory Visit: Payer: Self-pay | Admitting: Adult Health

## 2009-12-29 DIAGNOSIS — K5900 Constipation, unspecified: Secondary | ICD-10-CM | POA: Insufficient documentation

## 2009-12-29 DIAGNOSIS — K2900 Acute gastritis without bleeding: Secondary | ICD-10-CM | POA: Insufficient documentation

## 2009-12-29 LAB — CONVERTED CEMR LAB
AST: 24 units/L (ref 0–37)
BUN: 13 mg/dL (ref 6–23)
CO2: 25 meq/L (ref 19–32)
Calcium: 9.5 mg/dL (ref 8.4–10.5)
Creatinine, Ser: 1.14 mg/dL (ref 0.40–1.50)
Lymphocytes Relative: 48 % — ABNORMAL HIGH (ref 12–46)
Lymphs Abs: 1.9 10*3/uL (ref 0.7–4.0)
MCHC: 33.7 g/dL (ref 30.0–36.0)
Neutro Abs: 1.6 10*3/uL — ABNORMAL LOW (ref 1.7–7.7)
Platelets: 276 10*3/uL (ref 150–400)
RDW: 12.7 % (ref 11.5–15.5)
Total Bilirubin: 0.9 mg/dL (ref 0.3–1.2)
Total Protein: 7.5 g/dL (ref 6.0–8.3)

## 2009-12-31 ENCOUNTER — Telehealth (INDEPENDENT_AMBULATORY_CARE_PROVIDER_SITE_OTHER): Payer: Self-pay | Admitting: Licensed Clinical Social Worker

## 2010-01-18 ENCOUNTER — Ambulatory Visit: Payer: Self-pay | Admitting: Infectious Disease

## 2010-01-18 LAB — CONVERTED CEMR LAB
AST: 21 units/L (ref 0–37)
Albumin: 4.4 g/dL (ref 3.5–5.2)
Alkaline Phosphatase: 70 units/L (ref 39–117)
BUN: 16 mg/dL (ref 6–23)
Basophils Relative: 0 % (ref 0–1)
Calcium: 9.3 mg/dL (ref 8.4–10.5)
Chloride: 104 meq/L (ref 96–112)
Glucose, Bld: 79 mg/dL (ref 70–99)
Lymphs Abs: 1.8 10*3/uL (ref 0.7–4.0)
MCV: 100.5 fL — ABNORMAL HIGH (ref 78.0–100.0)
Monocytes Absolute: 0.3 10*3/uL (ref 0.1–1.0)
Neutrophils Relative %: 32 % — ABNORMAL LOW (ref 43–77)
Platelets: 273 10*3/uL (ref 150–400)
RDW: 13 % (ref 11.5–15.5)
Total Bilirubin: 0.8 mg/dL (ref 0.3–1.2)
Total Protein: 7.6 g/dL (ref 6.0–8.3)

## 2010-02-01 ENCOUNTER — Encounter (INDEPENDENT_AMBULATORY_CARE_PROVIDER_SITE_OTHER): Payer: Self-pay | Admitting: *Deleted

## 2010-02-09 ENCOUNTER — Ambulatory Visit: Payer: Self-pay | Admitting: Infectious Disease

## 2010-02-28 ENCOUNTER — Telehealth: Payer: Self-pay | Admitting: *Deleted

## 2010-04-12 NOTE — Miscellaneous (Signed)
Summary: clinical update/ryan white  Clinical Lists Changes  Observations: Added new observation of PCTFPL: 148.85  (09/08/2009 12:25) Added new observation of INCOMESOURCE: wages  (09/08/2009 12:25) Added new observation of HOUSEINCOME: 69629  (09/08/2009 12:25) Added new observation of FINASSESSDT: 07/30/2009  (09/08/2009 12:25)

## 2010-04-12 NOTE — Progress Notes (Signed)
Summary: NCADaP/pt assist meds arrived for Apr  Phone Note Refill Request      Prescriptions: ISENTRESS 400 MG TABS (RALTEGRAVIR POTASSIUM) Take 1 tablet by mouth two times a day  #60 x 0   Entered by:   Paulo Fruit  BS,CPht II,MPH   Authorized by:   Acey Lav MD   Signed by:   Paulo Fruit  BS,CPht II,MPH on 07/09/2009   Method used:   Samples Given   RxID:   [id number] TRUVADA 200-300 MG TABS (EMTRICITABINE-TENOFOVIR) Take 1 tablet by mouth once a day  #30 x 0   Entered by:   Paulo Fruit  BS,CPht II,MPH   Authorized by:   Acey Lav MD   Signed by:   Paulo Fruit  BS,CPht II,MPH on 07/09/2009   Method used:   Samples Given   RxID:   [id number]   Patient Assist Medication Verification: Medication: truvada Lot# [id number] Exp Date:09 2014 Tech approval:MLD                Patient Assist Medication Verification: Medication:Isentress 400mg  Lot# [id number] Exp Date:03 2013 Tech approval:MLD                 Call placed to patient with message that assistance medications are ready for pick-up. Left message on VM to contact [facility] @ [phone] Paulo Fruit  BS,CPht II,MPH  July 09, 2009 4:19 PM

## 2010-04-12 NOTE — Progress Notes (Signed)
Summary: Request for Elma cream refill   Phone Note Call from Patient   Summary of Call: requesting refill of lidocaine 1% creme for face to [facility].  Initial call taken by: Tomasita Morrow RN,  August 26, 2009 3:31 PM    Prescriptions: EMLA 2.5-2.5 % CREA (LIDOCAINE-PRILOCAINE) apply to face for burning as directeed by dermatologist, quantity suffiicient one month  #30 x 2   Entered by:   Tomasita Morrow RN   Authorized by:   Acey Lav MD   Signed by:   Tomasita Morrow RN on 08/26/2009   Method used:   Electronically to        [id number]* (retail)       [address] [place], [place]  [address]       Ph: [phone]       Fax: [phone]   RxID:   [id number]

## 2010-04-12 NOTE — Miscellaneous (Signed)
Summary: clinical update/ryan white NCADAP apprv til 06/11/10  Clinical Lists Changes  Observations: Added new observation of AIDSDAP: Yes 2011 (06/21/2009 10:28)

## 2010-04-12 NOTE — Progress Notes (Signed)
Summary: NCADAP/pt assist meds arrived for Feb  Phone Note Refill Request      Prescriptions: ISENTRESS 400 MG TABS (RALTEGRAVIR POTASSIUM) Take 1 tablet by mouth two times a day  #60 x 0   Entered by:   Paulo Fruit  BS,CPht II,MPH   Authorized by:   Acey Lav MD   Signed by:   Paulo Fruit  BS,CPht II,MPH on 04/14/2009   Method used:   Samples Given   RxID:   [id number] TRUVADA 200-300 MG TABS (EMTRICITABINE-TENOFOVIR) Take 1 tablet by mouth once a day  #30 x 0   Entered by:   Paulo Fruit  BS,CPht II,MPH   Authorized by:   Acey Lav MD   Signed by:   Paulo Fruit  BS,CPht II,MPH on 04/14/2009   Method used:   Samples Given   RxID:   [id number]   Patient Assist Medication Verification: Medication: Isentress 400mg  Lot# [id number] Exp Date:02 2013 Tech approval:MLD                Patient Assist Medication Verification: Medication:Truvada Lot# [id number] Exp Date:07 2014 Tech approval:MLD Call placed to patient with message that assistance medications are ready for pick-up. Paulo Fruit  BS,CPht II,MPH  April 14, 2009 2:39 PM

## 2010-04-12 NOTE — Progress Notes (Signed)
Summary: new scripts to new [facility]  Phone Note Call from Patient   Caller: Patient Summary of Call: Patlient walked in to the office questioning the new [facility] pharmacy.  Evidently [facility] did not tranfer this patient's information to the [facility].  He has medication from May 25, that he had not picked up.  I will go ahead and send the information to the [facility] and tell them not to send until October 02, 2009. Initial call taken by: Paulo Fruit  BS,CPht II,MPH,  September 03, 2009 9:27 AM    Prescriptions: ISENTRESS 400 MG TABS (RALTEGRAVIR POTASSIUM) Take 1 tablet by mouth two times a day  #60 x 11   Entered by:   Paulo Fruit  BS,CPht II,MPH   Authorized by:   Acey Lav MD   Signed by:   Paulo Fruit  BS,CPht II,MPH on 09/03/2009   Method used:   Electronically to        [facility] [phone]* (retail)       [address]       [place], [place]  [address]       Ph: [phone]       Fax:    RxID:   [id number] TRUVADA 200-300 MG TABS (EMTRICITABINE-TENOFOVIR) Take 1 tablet by mouth once a day  #30 x 11   Entered by:   Paulo Fruit  BS,CPht II,MPH   Authorized by:   Acey Lav MD   Signed by:   Paulo Fruit  BS,CPht II,MPH on 09/03/2009   Method used:   Electronically to        [facility] [phone]* (retail)       [address]       [place], [place]  [address]       Ph: [phone]       Fax:    RxID:   [id number]  Paulo Fruit  BS,CPht II,MPH  September 03, 2009 9:27 AM

## 2010-04-12 NOTE — Miscellaneous (Signed)
  Clinical Lists Changes  Observations: Added new observation of YEARAIDSPOS: 2004  (02/01/2010 14:52)

## 2010-04-12 NOTE — Progress Notes (Signed)
Summary: NCADAP/pt assist meds arrived for Apr  Phone Note Refill Request      Prescriptions: ISENTRESS 400 MG TABS (RALTEGRAVIR POTASSIUM) Take 1 tablet by mouth two times a day  #60 x 0   Entered by:   Paulo Fruit  BS,CPht II,MPH   Authorized by:   Acey Lav MD   Signed by:   Paulo Fruit  BS,CPht II,MPH on 06/15/2009   Method used:   Samples Given   RxID:   [id number] TRUVADA 200-300 MG TABS (EMTRICITABINE-TENOFOVIR) Take 1 tablet by mouth once a day  #30 x 0   Entered by:   Paulo Fruit  BS,CPht II,MPH   Authorized by:   Acey Lav MD   Signed by:   Paulo Fruit  BS,CPht II,MPH on 06/15/2009   Method used:   Samples Given   RxID:   [id number]   Patient Assist Medication Verification: Medication: Truvada Lot# [id number] Exp Date:09 2014 Tech approval:MLD                Patient Assist Medication Verification: Medication:Isentress 400mg  Lot# [id number] Exp Date:08 2012 Tech approval:MLD               Tried to contact patient.  I was unable to leave a message at the time call was placed. Paulo Fruit  BS,CPht II,MPH  June 15, 2009 4:45 PM

## 2010-04-12 NOTE — Progress Notes (Signed)
Summary: Epigastric pain since yesterday, sl. relief w/ drinking water  Phone Note Call from Patient Call back at Home Phone [phone]   Caller: Patient Reason for Call: Acute Illness Action Taken: Rx Called In, Appt Scheduled Today Details of Action Taken: 2:00pm w/ B.Farrington,NP Summary of Call: Epigastric pain started 12/28/09.  Relieved slightly by drinking water.  Tried PeptoBismol tablet without relief.  Requested appt. Jennet Maduro RN  December 29, 2009 11:27 AM

## 2010-04-12 NOTE — Assessment & Plan Note (Signed)
Summary: F/U [MKJ]   Visit Type:  Follow-up Primary Provider:  Paulette Blanch Dam MD  CC:  follow-up visit.  History of Present Illness: 50 year old with HIV, Kaposis' patient with good virological control and immune reconstitution. He had major bout with depression in December with suicidal ideation, with heavy drinking. He was admitted to [facility] on 02/11/2009. His VL had popped above 600 and attempts wwere made to check genotype but there was insufficent and recheck viral load was in the 200s. The patient states that he contiues to be sober. He also did suffer a DUI during his episodes of alcoholism. He is not sexually active. He continues to suffer from neurpathic pain in his face where he had VZV eruption. This pain is not "touched by vicodin." but actually does respond to his doxepin.   Problems Prior to Update: 1)  Alcoholism  (ICD-303.90) 2)  Encounter For Therapeutic Drug Monitoring  (ICD-V58.83) 3)  Herpes Zoster  (ICD-053.9) 4)  Neuralgia, Trigeminal  (ICD-350.1) 5)  Polyuria  (ICD-788.42) 6)  Nocturia  (ICD-788.43) 7)  Anemia, Macrocytic, Chronic  (ICD-281.9) 8)  Gynecomastia  (ICD-611.1) 9)  Carbuncle and Furuncle of Unspecified Site  (ICD-680.9) 10)  Leukocytopenia Unspecified  (ICD-288.50) 11)  Rosacea  (ICD-695.3) 12)  Edema  (ICD-782.3) 13)  Paroxysmal Nocturnal Dyspnea  (ICD-786.09) 14)  Health Screening  (ICD-V70.0) 15)  Diarrhea  (ICD-787.91) 16)  Unspecified Pruritic Disorder  (ICD-698.9) 17)  Disorder, Depressive Nec  (ICD-311) 18)  Kaposi's Sarcoma, Skin  (ICD-176.0) 19)  HIV Disease  (ICD-042) 20)  Hepatitis C, Resolved  (ICD-070.51)  Medications Prior to Update: 1)  Truvada 200-300 Mg Tabs (Emtricitabine-Tenofovir) .... Take 1 Tablet By Mouth Once A Day 2)  Isentress 400 Mg Tabs (Raltegravir Potassium) .... Take 1 Tablet By Mouth Two Times A Day 3)  Emla 2.5-2.5 % Crea (Lidocaine-Prilocaine) .... Apply To Face For Burning As Directeed By Dermatologist, Quantity  Suffiicient One Month 4)  Valtrex 1 Gm Tabs (Valacyclovir Hcl) .... Three Times A Day For 10 Days 5)  Thiamine Hcl 100 Mg Tabs (Thiamine Hcl) .... Take 1 Tablet By Mouth Once A Day 6)  Multivitamins  Tabs (Multiple Vitamin) .... Take 1 Tablet By Mouth Once A Day 7)  Doxepin Hcl 75 Mg Caps (Doxepin Hcl) .... Three Tablets At Night 8)  Vicodin 5-500 Mg Tabs (Hydrocodone-Acetaminophen) .... One To Two Tablets Three Times A Day As Needed  Current Medications (verified): 1)  Truvada 200-300 Mg Tabs (Emtricitabine-Tenofovir) .... Take 1 Tablet By Mouth Once A Day 2)  Isentress 400 Mg Tabs (Raltegravir Potassium) .... Take 1 Tablet By Mouth Two Times A Day 3)  Emla 2.5-2.5 % Crea (Lidocaine-Prilocaine) .... Apply To Face For Burning As Directeed By Dermatologist, Quantity Suffiicient One Month 4)  Valtrex 1 Gm Tabs (Valacyclovir Hcl) .... Three Times A Day For 10 Days 5)  Thiamine Hcl 100 Mg Tabs (Thiamine Hcl) .... Take 1 Tablet By Mouth Once A Day 6)  Multivitamins  Tabs (Multiple Vitamin) .... Take 1 Tablet By Mouth Once A Day 7)  Doxepin Hcl 75 Mg Caps (Doxepin Hcl) .... Three Tablets At Night 8)  Vicodin 5-500 Mg Tabs (Hydrocodone-Acetaminophen) .... One To Two Tablets Three Times A Day As Needed  Allergies: 1)  ! Pcn   Preventive Screening-Counseling & Management  Alcohol-Tobacco     Alcohol drinks/day: 0     Smoking Status: never  Caffeine-Diet-Exercise     Caffeine use/day: tea, occassional soda     Does  Patient Exercise: yes     Type of exercise: walking, working out     Exercise (avg: min/session): 30-60     Times/week: 5  Safety-Violence-Falls     Seat Belt Use: yes   Current Allergies (reviewed today): ! PCN Past History:  Past Medical History: Last updated: 03/22/2009 Hepatitis C, resolved HIV disease Kaposis sarcoma cutaneous Heat induced facial pruritis (seems to me now likely to be due to Rosacea) Rhinophyma Asymmetric gynecomastia ? due to sustiva Macrocytic  anemia Major Depression Bout of alcholism  Past Surgical History: Last updated: 06/07/2009 no recetn surgery  Family History: Last updated: 07/22/2007 no early cad  Social History: Last updated: 03/22/2009 Single Never Smoked Alcohol use-had bout of heavy drinking associated with his depression now sober he claims Part time job  Risk Factors: Alcohol Use: 0 (07/29/2009) Caffeine Use: tea, occassional soda (07/29/2009) Exercise: yes (07/29/2009)  Risk Factors: Smoking Status: never (07/29/2009)  Family History: Reviewed history from 07/22/2007 and no changes required. no early cad  Social History: Reviewed history from 03/22/2009 and no changes required. Single Never Smoked Alcohol use-had bout of heavy drinking associated with his depression now sober he claims Part time job  Review of Systems  The patient denies anorexia, fever, weight loss, weight gain, vision loss, decreased hearing, hoarseness, chest pain, syncope, dyspnea on exertion, peripheral edema, prolonged cough, headaches, hemoptysis, abdominal pain, melena, hematochezia, severe indigestion/heartburn, hematuria, incontinence, genital sores, muscle weakness, suspicious skin lesions, transient blindness, difficulty walking, depression, unusual weight change, abnormal bleeding, and enlarged lymph nodes.    Vital Signs:  Patient profile:   50 year old male Height:      73 inches (185.42 cm) Weight:      163.4 pounds (74.27 kg) BMI:     21.64 Temp:     98.4 degrees F (36.89 degrees C) oral Pulse rate:   73 / minute BP sitting:   109 / 65  (left arm)  Vitals Entered By: Baxter Hire) (Jul 29, 2009 1:55 PM) CC: follow-up visit Pain Assessment Patient in pain? yes     Location: face Intensity: 3 Type: soreness Onset of pain  pain comes and goes Nutritional Status BMI of 19 -24 = normal Nutritional Status Detail appetite is great per patient  Have you ever been in a relationship where you felt  threatened, hurt or afraid?No   Does patient need assistance? Functional Status Self care Ambulation Normal        Medication Adherence: 07/29/2009   Adherence to medications reviewed with patient. Counseling to provide adequate adherence provided   Prevention For Positives: 07/29/2009   Safe sex practices discussed with patient. Condoms offered.   Education Materials Provided: 07/29/2009 Safe sex practices discussed with patient. Condoms offered.                          Physical Exam  General:  alert.  well-hydrated.   Head:  normocephalic and no abnormalities observed.   Eyes:  vision grossly intact, pupils equal, pupils round, and pupils reactive to light.   Ears:  no external deformities.   Nose:  rhinophymiano external erythema and no nasal discharge.   Mouth:  pharynx pink and moist, no erythema, and no exudates.   Neck:  supple and full ROM.   Lungs:  normal respiratory effort, normal breath sounds, no crackles, and no wheezes.   Heart:  normal rate.  regular rhythm, no murmur, and no gallop.   Abdomen:  no distention.  soft and non-tender.   Msk:  normal ROM.  no joint deformities.   Neurologic:  alert & oriented X3, strength normal in all extremities, and gait normal.   Skin:  he has chronic ks lesions on legs Psych:  he is in good spirits today though frustrated with persistent pain in his face   Impression & Recommendations:  Problem # 1:  HIV DISEASE (ICD-042) Assessment Comment Only  Viral load has come down recheck here today His updated medication list for this problem includes:    Valtrex 1 Gm Tabs (Valacyclovir hcl) .Marland Kitchen... Three times a day for 10 days  Diagnostics Reviewed:  HIV: CDC-defined AIDS (11/23/2008)   CD4: 550 (05/21/2009)   WBC: 3.5 (05/21/2009)   Hgb: 13.2 (05/21/2009)   HCT: 40.1 (05/21/2009)   Platelets: 262 (05/21/2009) HIV genotype: * (06/09/2009)   HIV-1 RNA: 217 (06/07/2009)   HBSAg: NO (05/07/2006)  Orders: Est. Patient Level  IV (14782)  Problem # 2:  ALCOHOLISM (ICD-303.90) Assessment: Comment Only  in remission  Orders: Est. Patient Level IV (95621)  Problem # 3:  NEURALGIA, TRIGEMINAL (ICD-350.1)  I have given him rx for oxycodone to try, and to continue his doxepin  Orders: Est. Patient Level IV (30865)  Problem # 4:  LEUKOCYTOPENIA UNSPECIFIED (ICD-288.50)  chroinic, ? due to his sever infection. Cannot find an offensind drug  Orders: Est. Patient Level IV (78469)  Problem # 5:  DISORDER, DEPRESSIVE NEC (ICD-311) Assessment: Comment Only  stable His updated medication list for this problem includes:    Doxepin Hcl 75 Mg Caps (Doxepin hcl) .Marland Kitchen... Three tablets at night  Orders: Est. Patient Level IV (62952)  Medications Added to Medication List This Visit: 1)  Oxycodone Hcl 5 Mg Tabs (Oxycodone hcl) .... Take one to two tablets as needed for pain daily  Other Orders: T-CD4SP [facility]) (CD4SP) T-HIV Viral Load [phone]) T-Comprehensive Metabolic Panel [phone]) T-CBC w/Diff [phone]) Pneumococcal Vaccine (87564) Admin 1st Vaccine (33295) Future Orders: T-CD4SP ([facility]) (CD4SP) ... 09/13/2009 T-HIV Viral Load [phone]) ... 09/13/2009 T-CBC w/Diff ([phone]) ... 09/13/2009 T-Comprehensive Metabolic Panel [phone]) ... 09/13/2009 T-Lipid Profile [phone]) ... 09/13/2009  Patient Instructions: 1)  rtc in 2 months to see dr. Daiva Eves 2)  please see one of our case managers today    Immunizations Administered:  Pneumonia Vaccine:    Vaccine Type: Pneumovax    Site: right deltoid    Mfr: Merck    Dose: 0.5 ml    Route: IM    Given by: Starleen Arms CMA    Exp. Date: 01/05/2011    Lot #: [id number]    VIS given: 10/09/95 version given Jul 29, 2009. Prescriptions: OXYCODONE HCL 5 MG TABS (OXYCODONE HCL) take one to two tablets as needed for pain daily  #60 x 0   Entered and Authorized by:   Acey Lav MD   Signed by:   Paulette Blanch Dam MD  on 07/29/2009   Method used:   Print then Give to Patient   RxID:   [id number]

## 2010-04-12 NOTE — Progress Notes (Signed)
Summary: NCADAP/pt assist meds arrived for May  Phone Note Refill Request      Prescriptions: ISENTRESS 400 MG TABS (RALTEGRAVIR POTASSIUM) Take 1 tablet by mouth two times a day  #60 x 0   Entered by:   Paulo Fruit  BS,CPht II,MPH   Authorized by:   Acey Lav MD   Signed by:   Paulo Fruit  BS,CPht II,MPH on 08/04/2009   Method used:   Samples Given   RxID:   [id number] TRUVADA 200-300 MG TABS (EMTRICITABINE-TENOFOVIR) Take 1 tablet by mouth once a day  #30 x 0   Entered by:   Paulo Fruit  BS,CPht II,MPH   Authorized by:   Acey Lav MD   Signed by:   Paulo Fruit  BS,CPht II,MPH on 08/04/2009   Method used:   Samples Given   RxID:   [id number]   Patient Assist Medication Verification: Medication: Isentress 400mg  Lot# [id number] Exp Date:05 2013 Tech approval:MLD                Patient Assist Medication Verification: Medication:Truvada Lot# [id number] Exp Date:11 2014 Tech approval:MLD Patient picked up April's meds earlier in the week. Paulo Fruit  BS,CPht II,MPH  Aug 04, 2009 9:18 AM                   Appended Document: NCADAP/pt assist meds arrived for May Prescription/Samples picked up by: patient

## 2010-04-12 NOTE — Progress Notes (Signed)
Summary: ncadap meds arrived for Aug  Phone Note Refill Request      Prescriptions: ISENTRESS 400 MG TABS (RALTEGRAVIR POTASSIUM) Take 1 tablet by mouth two times a day  #60 x 0   Entered by:   Paulo Fruit  BS,CPht II,MPH   Authorized by:   Acey Lav MD   Signed by:   Paulo Fruit  BS,CPht II,MPH on 11/05/2009   Method used:   Samples Given   RxID:   [id number] TRUVADA 200-300 MG TABS (EMTRICITABINE-TENOFOVIR) Take 1 tablet by mouth once a day  #30 x 0   Entered by:   Paulo Fruit  BS,CPht II,MPH   Authorized by:   Acey Lav MD   Signed by:   Paulo Fruit  BS,CPht II,MPH on 11/05/2009   Method used:   Samples Given   RxID:   [id number]  Patient Assist Medication Verification: Medication name: Isentress 400mg  RX # [id number] Tech approval:MLD  Patient Assist Medication Verification: Medication name:Truvada RX # [id number] Tech approval:MLD Call placed to patient with message that assistance medications are ready for pick-up. Paulo Fruit  BS,CPht II,MPH  November 05, 2009 2:39 PM   Appended Document: ncadap meds arrived for Aug Prescription/Samples picked up by: patient

## 2010-04-12 NOTE — Progress Notes (Signed)
Summary: ncadap meds arrived for sept-left msg for pt to call office  Phone Note Refill Request      Prescriptions: ISENTRESS 400 MG TABS (RALTEGRAVIR POTASSIUM) Take 1 tablet by mouth two times a day  #60 x 0   Entered by:   Paulo Fruit  BS,CPht II,MPH   Authorized by:   Acey Lav MD   Signed by:   Paulo Fruit  BS,CPht II,MPH on 12/03/2009   Method used:   Samples Given   RxID:   [id number] TRUVADA 200-300 MG TABS (EMTRICITABINE-TENOFOVIR) Take 1 tablet by mouth once a day  #30 x 0   Entered by:   Paulo Fruit  BS,CPht II,MPH   Authorized by:   Acey Lav MD   Signed by:   Paulo Fruit  BS,CPht II,MPH on 12/03/2009   Method used:   Samples Given   RxID:   [id number]  Patient Assist Medication Verification: Medication name:truvada RX # [id number] Tech approval:mld  Patient Assist Medication Verification: Medication name:isentress 400mg  RX # [id number] Tech approval:mld Call placed to patient with message that assistance medications are ready for pick-up. Left message for patient to contact our office Paulo Fruit  BS,CPht II,MPH  December 03, 2009 3:05 PM

## 2010-04-12 NOTE — Assessment & Plan Note (Signed)
Summary: 2 MONTH RECHECK/CH   Primary Provider:  Paulette Blanch Dam MD  CC:  f/u  and facial pain.  History of Present Illness: 50 year old with HIV, Kaposis' patient with prior goodwith good virological control and immune reconstitution. He had major bout with depression in December with suicidal ideation, with heavy drinking. He was admitted to [facility] on 02/11/2009. He was switched to mirtazepine, then restarted his doxepin. He was seeing a  counselor once a week. He never joined AA but says his counselor supported this decision and he has not been drinking at all since then. His VL was disturbingly over 600 on check thismonth. He denies missing any doses since I last saw him. He has suffered from severe neuropathic pain and was seen by one of our former residents Valetta Close in the [facility] and given rx for gabapentin and capzaicin cream. He has not yet filled either. Gabapentin did not help him in the past at 300mg  and he says he is already falling asleep with doxepin at night and cannto tolerate any further extra sedating meds at night.  Current Medications (verified): 1)  Truvada 200-300 Mg Tabs (Emtricitabine-Tenofovir) .... Take 1 Tablet By Mouth Once A Day 2)  Isentress 400 Mg Tabs (Raltegravir Potassium) .... Take 1 Tablet By Mouth Two Times A Day 3)  Emla 2.5-2.5 % Crea (Lidocaine-Prilocaine) .... Apply To Face For Burning As Directeed By Dermatologist, Quantity Suffiicient One Month 4)  Valtrex 1 Gm Tabs (Valacyclovir Hcl) .... Three Times A Day For 10 Days 5)  Thiamine Hcl 100 Mg Tabs (Thiamine Hcl) .... Take 1 Tablet By Mouth Once A Day 6)  Multivitamins  Tabs (Multiple Vitamin) .... Take 1 Tablet By Mouth Once A Day 7)  Doxepin Hcl 75 Mg Caps (Doxepin Hcl) .... Three Tablets At Night 8)  Vicodin 5-500 Mg Tabs (Hydrocodone-Acetaminophen) .... One To Two Tablets Three Times A Day As Needed  Allergies: 1)  ! Pcn   Preventive Screening-Counseling &  Management  Alcohol-Tobacco     Alcohol drinks/day: 0     Alcohol type: liquor     Smoking Status: never  Caffeine-Diet-Exercise     Caffeine use/day: tea, occassional soda     Does Patient Exercise: yes     Type of exercise: walking, working out     Exercise (avg: min/session): 30-60     Times/week: 5   Current Allergies (reviewed today): ! PCN Past History:  Past Medical History: Last updated: 03/22/2009 Hepatitis C, resolved HIV disease Kaposis sarcoma cutaneous Heat induced facial pruritis (seems to me now likely to be due to Rosacea) Rhinophyma Asymmetric gynecomastia ? due to sustiva Macrocytic anemia Major Depression Bout of alcholism  Family History: Last updated: 07/22/2007 no early cad  Social History: Last updated: 03/22/2009 Single Never Smoked Alcohol use-had bout of heavy drinking associated with his depression now sober he claims Part time job  Risk Factors: Alcohol Use: 0 (06/07/2009) Caffeine Use: tea, occassional soda (06/07/2009) Exercise: yes (06/07/2009)  Risk Factors: Smoking Status: never (06/07/2009)  Past Surgical History: no recetn surgery  Family History: Reviewed history from 07/22/2007 and no changes required. no early cad  Social History: Reviewed history from 03/22/2009 and no changes required. Single Never Smoked Alcohol use-had bout of heavy drinking associated with his depression now sober he claims Part time job  Review of Systems  The patient denies anorexia, fever, weight loss, weight gain, vision loss, decreased hearing, hoarseness, chest pain, syncope, dyspnea on exertion, peripheral edema,  prolonged cough, headaches, hemoptysis, abdominal pain, melena, hematochezia, severe indigestion/heartburn, hematuria, incontinence, genital sores, muscle weakness, suspicious skin lesions, transient blindness, difficulty walking, depression, unusual weight change, abnormal bleeding, and enlarged lymph nodes.    Vital  Signs:  Patient profile:   50 year old male Height:      73 inches (185.42 cm) Weight:      172 pounds (78.18 kg) BMI:     22.77 Pulse rate:   93 / minute BP sitting:   114 / 81  (left arm)  Vitals Entered By: Starleen Arms CMA (June 07, 2009 2:42 PM) CC: f/u , facial pain Is Patient Diabetic? No Pain Assessment Patient in pain? yes     Location: face Intensity: 6 Type: burning Nutritional Status BMI of 19 -24 = normal Nutritional Status Detail nl  Does patient need assistance? Functional Status Self care Ambulation Normal   Physical Exam  General:  alert.  well-hydrated.   Head:  normocephalic and no abnormalities observed.   Eyes:  vision grossly intact, pupils equal, pupils round, and pupils reactive to light.   Ears:  no external deformities.   Nose:  rhinophymiano external erythema and no nasal discharge.   Mouth:  pharynx pink and moist, no erythema, and no exudates.   Neck:  supple and full ROM.   Lungs:  normal respiratory effort, normal breath sounds, no crackles, and no wheezes.   Heart:  normal rate.  regular rhythm, no murmur, and no gallop.   Abdomen:  no distention.  soft and non-tender.   Msk:  normal ROM.  no joint deformities.   Extremities:  No clubbing, cyanosis, edema, or deformity noted with normal full range of motion of all joints.   Neurologic:  alert & oriented X3, strength normal in all extremities, and gait normal.   Skin:  he has chronic ks lesions on legs Psych:  he is in good spirits today though frustrated with persistent pain in his face        Medication Adherence: 06/07/2009   Adherence to medications reviewed with patient. Counseling to provide adequate adherence provided   Prevention For Positives: 06/07/2009   Safe sex practices discussed with patient. Condoms offered.   Education Materials Provided: 06/07/2009 Safe sex practices discussed with patient. Condoms offered.                          Impression &  Recommendations:  Problem # 1:  HIV DISEASE (ICD-042) Will recheck vl today. If still elevated check genotype and intetgrase mutations if not check these on his VL of 600 His updated medication list for this problem includes:    Valtrex 1 Gm Tabs (Valacyclovir hcl) .Marland Kitchen... Three times a day for 10 days  Diagnostics Reviewed:  HIV: CDC-defined AIDS (11/23/2008)   CD4: 550 (05/21/2009)   WBC: 3.5 (05/21/2009)   Hgb: 13.2 (05/21/2009)   HCT: 40.1 (05/21/2009)   Platelets: 262 (05/21/2009) HIV genotype: REPORT (12/10/2006)   HIV-1 RNA: 689 (05/21/2009)   HBSAg: NO (05/07/2006)  Orders: T-HIV Genotype ([id number]) T- * Misc. Laboratory test [id number]) Est. Patient Level IV (45809)  Problem # 2:  NEURALGIA, TRIGEMINAL (ICD-350.1)  I have written for vicodin to help treat this chroinc pain rather than try to add lots of gabapenting to his tricyclic which has helped his depresion greatly.  Orders: Est. Patient Level IV (98338)  Problem # 3:  DISORDER, DEPRESSIVE NEC (ICD-311)  much better now but at risk fo relapse,  continue his doxepin His updated medication list for this problem includes:    Doxepin Hcl 75 Mg Caps (Doxepin hcl) .Marland Kitchen... Three tablets at night  Orders: Est. Patient Level IV (16109)  Problem # 4:  GYNECOMASTIA (ICD-611.1)  has regressed some since stoppng the atripla  Orders: Est. Patient Level IV (60454)  Problem # 5:  ALCOHOLISM (ICD-303.90)  he never joined AA, hope he can stay clear of this. I am still a bit anxious about him.  Orders: Est. Patient Level IV (09811)  Medications Added to Medication List This Visit: 1)  Vicodin 5-500 Mg Tabs (Hydrocodone-acetaminophen) .... One to two tablets three times a day as needed  Other Orders: T-GC Probe, urine [id number]) T-Chlamydia  Probe, urine [phone]) T-HIV Viral Load [phone]) Future Orders: T-CD4SP ([facility]) (CD4SP) ... 07/08/2009 T-HIV Viral Load [id number]) ... 07/08/2009 T-CBC w/Diff  ([id number]) ... 07/08/2009 T-Comprehensive Metabolic Panel [id number]) ... 07/08/2009  Patient Instructions: 1)  folllowup 6 weeks    Pneumonia Vaccine (to be given today) Prescriptions: VICODIN 5-500 MG TABS (HYDROCODONE-ACETAMINOPHEN) one to two tablets three times a day as needed  #60 x 0   Entered and Authorized by:   Acey Lav MD   Signed by:   Paulette Blanch Dam MD on 06/07/2009   Method used:   Print then Give to Patient   RxID:   [id number]  Process Orders Check Orders Results:     [facility]: ABN not required for this insurance Tests Sent for requisitioning (June 07, 2009 10:20 PM):     06/07/2009: [facility] -- T-GC Probe, urine [id number] (signed)     06/07/2009: [facility] -- T-Chlamydia  Probe, urine [id number] (signed)     06/07/2009: [facility] -- T-HIV Viral Load [id number] (signed)     06/07/2009: [facility] -- T-HIV Genotype [id number] (signed)     06/07/2009: [facility] -- T- * Misc. Laboratory test [[id number]] (signed)     07/08/2009: [facility] -- T-HIV Viral Load [id number] (signed)     07/08/2009: [facility] -- T-CBC w/Diff [[id number]] (signed)     07/08/2009: [facility] -- T-Comprehensive Metabolic Panel [[id number]] (signed)     06/07/2009: [facility] -- T-HIV Genotype [id number] (signed)     06/07/2009: [facility] -- T- * Misc. Laboratory test [phone] (signed)

## 2010-04-12 NOTE — Progress Notes (Signed)
  Phone Note Outgoing Call   Caller: Patient Call placed by: Starleen Arms CMA,  December 31, 2009 4:52 PM Call placed to: Patient Summary of Call: [facility] medications arrived, patient aware. Initial call taken by: Starleen Arms CMA,  December 31, 2009 4:54 PM     Appended Document:  Pt. picked up rxes

## 2010-04-12 NOTE — Assessment & Plan Note (Signed)
Summary: F/U/VS   Visit Type:  Follow-up Primary Provider:  Paulette Blanch Dam MD  CC:  f/u .  History of Present Illness: 50 year old with HIV, Kaposis' patien with good virological control and immune reconstitution. He had major bout with depression approx a month ago with suicidal ideation, with heavy drinking. He was admitted to [facility] on 02/11/2009. He was switched to mirtazepine, then restarted his doxepin. He is seeing a counselor once a week since then. He is still not yet plugged into AA. He started working with part time job.  Problems Prior to Update: 1)  Encounter For Therapeutic Drug Monitoring  (ICD-V58.83) 2)  Herpes Zoster  (ICD-053.9) 3)  Neuralgia, Trigeminal  (ICD-350.1) 4)  Polyuria  (ICD-788.42) 5)  Nocturia  (ICD-788.43) 6)  Anemia, Macrocytic, Chronic  (ICD-281.9) 7)  Gynecomastia  (ICD-611.1) 8)  Carbuncle and Furuncle of Unspecified Site  (ICD-680.9) 9)  Leukocytopenia Unspecified  (ICD-288.50) 10)  Rosacea  (ICD-695.3) 11)  Edema  (ICD-782.3) 12)  Paroxysmal Nocturnal Dyspnea  (ICD-786.09) 13)  Health Screening  (ICD-V70.0) 14)  Diarrhea  (ICD-787.91) 15)  Unspecified Pruritic Disorder  (ICD-698.9) 16)  Disorder, Depressive Nec  (ICD-311) 17)  Kaposi's Sarcoma, Skin  (ICD-176.0) 18)  HIV Disease  (ICD-042) 19)  Hepatitis C, Resolved  (ICD-070.51)  Medications Prior to Update: 1)  Truvada 200-300 Mg Tabs (Emtricitabine-Tenofovir) .... Take 1 Tablet By Mouth Once A Day 2)  Isentress 400 Mg Tabs (Raltegravir Potassium) .... Take 1 Tablet By Mouth Two Times A Day 3)  Flomax 0.4 Mg Caps (Tamsulosin Hcl) .... Take 1 Tablet By Mouth Once A Day 4)  Emla 2.5-2.5 % Crea (Lidocaine-Prilocaine) .... Apply To Face For Burning As Directeed By Dermatologist, Quantity Suffiicient One Month 5)  Valtrex 1 Gm Tabs (Valacyclovir Hcl) .... Three Times A Day For 10 Days 6)  Thiamine Hcl 100 Mg Tabs (Thiamine Hcl) .... Take 1 Tablet By Mouth Once A Day 7)  Mirtazapine 15 Mg  Tabs (Mirtazapine) .... Take 1 Tablet By Mouth At Bedtime 8)  Multivitamins  Tabs (Multiple Vitamin) .... Take 1 Tablet By Mouth Once A Day  Allergies: 1)  ! Pcn   Preventive Screening-Counseling & Management  Alcohol-Tobacco     Alcohol drinks/day: 0     Alcohol type: liquor     Smoking Status: never  Caffeine-Diet-Exercise     Caffeine use/day: tea, occassional soda     Does Patient Exercise: yes     Type of exercise: walking, working out     Exercise (avg: min/session): 30-60     Times/week: 5  Safety-Violence-Falls     Seat Belt Use: yes      Drug Use:  never.        Blood Transfusions:  no.        Travel History:  no.     Current Allergies (reviewed today): ! PCN Past History:  Past Medical History: Hepatitis C, resolved HIV disease Kaposis sarcoma cutaneous Heat induced facial pruritis (seems to me now likely to be due to Rosacea) Rhinophyma Asymmetric gynecomastia ? due to sustiva Macrocytic anemia Major Depression Bout of alcholism  Family History: Reviewed history from 07/22/2007 and no changes required. no early cad  Social History: Reviewed history from 07/22/2007 and no changes required. Single Never Smoked Alcohol use-had bout of heavy drinking associated with his depression now sober he claims Part time job Drug Use:  never Blood Transfusions:  no Travel History:  no  Review of Systems  The  patient denies anorexia, fever, weight loss, decreased hearing, hoarseness, chest pain, dyspnea on exertion, peripheral edema, prolonged cough, headaches, hemoptysis, abdominal pain, melena, hematochezia, severe indigestion/heartburn, hematuria, incontinence, genital sores, muscle weakness, suspicious skin lesions, difficulty walking, depression, and enlarged lymph nodes.    Vital Signs:  Patient profile:   50 year old male Height:      73 inches (185.42 cm) Weight:      163.44 pounds (74.29 kg) BMI:     21.64 Pulse rate:   89 / minute BP sitting:    124 / 82  (left arm)  Vitals Entered By: Starleen Arms CMA (March 22, 2009 9:06 AM) CC: f/u  Is Patient Diabetic? No Pain Assessment Patient in pain? no      Nutritional Status BMI of 19 -24 = normal Nutritional Status Detail nl  Have you ever been in a relationship where you felt threatened, hurt or afraid?No   Does patient need assistance? Functional Status Self care Ambulation Normal   Physical Exam  General:  alert.  well-hydrated.   Head:  normocephalic and no abnormalities observed.   Eyes:  vision grossly intact, pupils equal, pupils round, and pupils reactive to light.   Ears:  no external deformities.   Nose:  rhinophymiano external erythema and no nasal discharge.   Mouth:  pharynx pink and moist, no erythema, and no exudates.   Neck:  supple and full ROM.   Lungs:  normal respiratory effort, normal breath sounds, no crackles, and no wheezes.   Heart:  normal rate.  regular rhythm, no murmur, and no gallop.   Abdomen:  no distention.  soft and non-tender.   Msk:  normal ROM.  no joint deformities.   Extremities:  No clubbing, cyanosis, edema, or deformity noted with normal full range of motion of all joints.   Neurologic:  alert & oriented X3, strength normal in all extremities, and gait normal.   Skin:  he has chronic ks lesions on legs Psych:  he is in good spirits today and with good insight        Medication Adherence: 03/22/2009   Adherence to medications reviewed with patient. Counseling to provide adequate adherence provided   Prevention For Positives: 03/22/2009   Safe sex practices discussed with patient. Condoms offered.   Education Materials Provided: 03/22/2009 Safe sex practices discussed with patient. Condoms offered.                          Impression & Recommendations:  Problem # 1:  HIV DISEASE (ICD-042)  Excellent  control! His updated medication list for this problem includes:    Valtrex 1 Gm Tabs (Valacyclovir hcl)  .Marland Kitchen... Three times a day for 10 days  Diagnostics Reviewed:  HIV: CDC-defined AIDS (11/23/2008)   CD4: 410 (03/04/2009)   WBC: 3.2 (03/03/2009)   Hgb: 12.9 (03/03/2009)   HCT: 38.9 (03/03/2009)   Platelets: 329 (03/03/2009) HIV genotype: REPORT (12/10/2006)   HIV-1 RNA: 124 (03/03/2009)   HBSAg: NO (05/07/2006)  Orders: Est. Patient Level IV (09811)  Problem # 2:  DISORDER, DEPRESSIVE NEC (ICD-311) Assessment: Improved  Had major bout of depresison with SI and heavy drinking. NOw doing much better. He liked the doxpin better and went back to this so I filled that scrip. Contiuje to fu with MH plug into AA, rtc in 2 months with me The following medications were removed from the medication list:    Mirtazapine 15 Mg Tabs (Mirtazapine) .Marland Kitchen... Take  1 tablet by mouth at bedtime His updated medication list for this problem includes:    Doxepin Hcl 75 Mg Caps (Doxepin hcl) .Marland Kitchen... Three tablets at night  Orders: Est. Patient Level IV (16109)  Problem # 3:  POLYURIA (ICD-788.42)  resolved, will dc the alpha blocker  Orders: Est. Patient Level IV (60454)  Problem # 4:  NEURALGIA, TRIGEMINAL (ICD-350.1) Assessment: Comment Only  chronic pain managed in part with his lidoderm topical stable  Orders: Est. Patient Level IV (09811)  Problem # 5:  UNSPECIFIED PRURITIC DISORDER (ICD-698.9) doxepin also helps this  Problem # 6:  HERPES ZOSTER (ICD-053.9) Assessment: Improved outbreak resolved Orders: Est. Patient Level IV (91478)  Medications Added to Medication List This Visit: 1)  Doxepin Hcl 75 Mg Caps (Doxepin hcl) .... Three tablets at night  Other Orders: T-GC Probe, urine [id number]) T-Chlamydia  Probe, urine [id number]) Future Orders: T-CD4SP ([facility]) (CD4SP) ... 05/03/2009 T-HIV Viral Load [id number]) ... 05/03/2009 T-CBC w/Diff ([id number]) ... 05/03/2009 T-Comprehensive Metabolic Panel ([id number]) ... 05/03/2009 T-Lipid Profile [id number]) ...  05/03/2009 T-RPR (Syphilis) [id number]) ... 05/03/2009  Patient Instructions: 1)  Rtc in 2 months to see Dr Daiva Eves    Pneumonia Vaccine (to be given today) Prescriptions: DOXEPIN HCL 75 MG CAPS (DOXEPIN HCL) three tablets at night  #90 x 4   Entered and Authorized by:   Acey Lav MD   Signed by:   Paulette Blanch Dam MD on 03/22/2009   Method used:   Electronically to        [facility] [phone]* (retail)       [address]       [place], [place]  [address]       Ph: [phone]       Fax: [phone]   RxID:   [id number]  Process Orders Check Orders Results:     [facility]: ABN not required for this insurance Tests Sent for requisitioning (March 22, 2009 9:39 AM):     03/22/2009: [facility] -- T-GC Probe, urine [id number] (signed)     03/22/2009: [facility] -- T-Chlamydia  Probe, urine [phone] (signed)     05/03/2009: [facility] -- T-HIV Viral Load [id number] (signed)     05/03/2009: [facility] -- T-CBC w/Diff [[id number]] (signed)     05/03/2009: [facility] -- T-Comprehensive Metabolic Panel [[id number]] (signed)     05/03/2009: [facility] -- T-Lipid Profile [phone] (signed)     05/03/2009: [facility] -- T-RPR (Syphilis) [phone] (signed)

## 2010-04-12 NOTE — Assessment & Plan Note (Addendum)
Summary: F/U[MKJ]   Visit Type:  Follow-up Primary Provider:  Paulette Blanch Dam MD  CC:  f/u.  History of Present Illness: 50 year old man well controlled HIV on isentress and truvada. Abdominal pain and dyspepsia resolved. He did not fill the doxepin recently due to cost issues. Denies active depression. He is not sexually active. He has not been able to afford his topical anesthetic and we spent time discussing what an alternative to doxepin would be for pain control and as an antidepressant. He had prior suicidal ideation when he was drinking but has been sober and doing well. We again contracted for safety. I spent greater than 45 minutes with this pt including greater than 50% of time in face to face counselling and coordination of care.  Preventive Screening-Counseling & Management  Alcohol-Tobacco     Alcohol drinks/day: 0     Alcohol type: liquor     Smoking Status: never      Drug Use:  never.        Blood Transfusions:  no.        Travel History:  no.     Current Allergies (reviewed today): ! PCN Past History:  Past Medical History: Last updated: 03/22/2009 Hepatitis C, resolved HIV disease Kaposis sarcoma cutaneous Heat induced facial pruritis (seems to me now likely to be due to Rosacea) Rhinophyma Asymmetric gynecomastia ? due to sustiva Macrocytic anemia Major Depression Bout of alcholism  Past Surgical History: Last updated: 06/07/2009 no recetn surgery  Family History: Last updated: 07/22/2007 no early cad  Social History: Last updated: 03/22/2009 Single Never Smoked Alcohol use-had bout of heavy drinking associated with his depression now sober he claims Part time job  Risk Factors: Alcohol Use: 0 (02/09/2010) Caffeine Use: tea, occassional soda (07/29/2009) Exercise: yes (07/29/2009)  Risk Factors: Smoking Status: never (02/09/2010)  Family History: Reviewed history from 07/22/2007 and no changes required. no early cad  Social  History: Reviewed history from 03/22/2009 and no changes required. Single Never Smoked Alcohol use-had bout of heavy drinking associated with his depression now sober he claims Part time job  Vital Signs:  Patient profile:   50 year old male Height:      73 inches (185.42 cm) Weight:      161.50 pounds (73.41 kg) BMI:     21.38 Pulse rate:   68 / minute BP sitting:   119 / 78  (left arm)  Vitals Entered By: Starleen Arms CMA (February 09, 2010 9:15 AM) CC: f/u Is Patient Diabetic? No Pain Assessment Patient in pain? no      Nutritional Status BMI of 19 -24 = normal Nutritional Status Detail nl  Does patient need assistance? Functional Status Self care Ambulation Normal   Physical Exam  General:  alert, well-developed, appropriate dress, cooperative to examination, good hygiene, a Head:  Normocephalic and atraumatic without obvious abnormalities. No apparent alopecia or balding. Eyes:  vision grossly intact, pupils equal, pupils round, and pupils reactive to light.   Nose:  rhinophymiano external erythema and no nasal discharge.   Mouth:  Oral mucosa and oropharynx without lesions or exudates.  Teeth in good repair. Neck:  No deformities, masses, or tenderness noted. Lungs:  Normal respiratory effort, chest expands symmetrically. Lungs are clear to auscultation, no crackles or wheezes. Heart:  Normal rate and regular rhythm. S1 and S2 normal without gallop, murmur, click, rub or other extra sounds. Abdomen:  no guarding, no rebound tenderness, no abdominal hernia, n Msk:  normal ROM.  no joint deformities.   Extremities:  No clubbing, cyanosis, edema, or deformity noted with normal full range of motion of all joints.   Neurologic:  alert & oriented X3, strength normal in all extremities, and gait normal.   Skin:  Intact without suspicious lesions or rashes Psych:  Cognition and judgment appear intact. Alert and cooperative with normal attention span and  concentration. No apparent delusions, illusions, hallucinations   Impression & Recommendations:  Problem # 1:  HIV DISEASE (ICD-042)  Superb control. The following medications were removed from the medication list:    Valtrex 1 Gm Tabs (Valacyclovir hcl) .Marland Kitchen... Three times a day for 10 days  Orders: Est. Patient Level V (16606)  Problem # 2:  CONSTIPATION (ICD-564.00)  resolved  Orders: Est. Patient Level V (30160)  Problem # 3:  NEURALGIA, TRIGEMINAL (ICD-350.1)  will start alternative  trycyclice to help with this  Orders: Est. Patient Level V (10932)  Problem # 4:  ACUTE GASTRITIS WITHOUT MENTION OF HEMORRHAGE (ICD-535.00)  resolved The following medications were removed from the medication list:    Famotidine 20 Mg Tabs (Famotidine) .Marland Kitchen... 1 tablet two times a day by mouth for next 7 days then once daily for 7 days then as needed  Orders: Est. Patient Level V (35573)  Problem # 5:  DISORDER, DEPRESSIVE NEC (ICD-311)  stable, starting different tricyclic The following medications were removed from the medication list:    Doxepin Hcl 75 Mg Caps (Doxepin hcl) .Marland Kitchen... Three tablets at night His updated medication list for this problem includes:    Nortriptyline Hcl 25 Mg Caps (Nortriptyline hcl) .Marland Kitchen... Take 1 tab by mouth at bedtime  Orders: Est. Patient Level V (22025)  Medications Added to Medication List This Visit: 1)  Nortriptyline Hcl 25 Mg Caps (Nortriptyline hcl) .... Take 1 tab by mouth at bedtime  Other Orders: Influenza Vaccine NON MCR (42706) Future Orders: T-CD4SP ([facility]) (CD4SP) ... 05/10/2010 T-HIV Viral Load [id number]) ... 05/10/2010 T-CBC w/Diff ([id number]) ... 05/10/2010 T-Comprehensive Metabolic Panel [id number]) ... 05/10/2010       Medication Adherence: 02/09/2010   Adherence to medications reviewed with patient. Counseling to provide adequate adherence provided   Prevention For Positives: 02/09/2010   Safe sex practices discussed  with patient. Condoms offered.                              Immunizations Administered:  Influenza Vaccine # 1:    Vaccine Type: Fluvax Non-MCR    Site: right deltoid    Mfr: novartis    Dose: 0.5 ml    Route: IM    Given by: Starleen Arms CMA    Exp. Date: 06/12/2010    Lot #: [id number]    VIS given: 10/05/09 version given February 09, 2010.  Flu Vaccine Consent Questions:    Do you have a history of severe allergic reactions to this vaccine? no    Any prior history of allergic reactions to egg and/or gelatin? no    Do you have a sensitivity to the preservative Thimersol? no    Do you have a past history of Guillan-Barre Syndrome? no    Do you currently have an acute febrile illness? no    Have you ever had a severe reaction to latex? no    Vaccine information given and explained to patient? yes   Patient Instructions: 1)  rtc in 4 months to see Dr. Daiva Eves Prescriptions: NORTRIPTYLINE  HCL 25 MG CAPS (NORTRIPTYLINE HCL) Take 1 tab by mouth at bedtime  #30 x 11   Entered and Authorized by:   Acey Lav MD   Signed by:   Paulette Blanch Dam MD on 02/09/2010   Method used:   Electronically to        [facility] [phone]* (retail)       [address]       [place], [place]  [address]       Ph: [phone]       Fax: [phone]   RxID:   [id number]

## 2010-04-12 NOTE — Progress Notes (Signed)
Summary: [facility]/pt assist meds arrived for Mar  Phone Note Refill Request      Prescriptions: ISENTRESS 400 MG TABS (RALTEGRAVIR POTASSIUM) Take 1 tablet by mouth two times a day  #60 x 0   Entered by:   Paulo Fruit  BS,CPht II,MPH   Authorized by:   Acey Lav MD   Signed by:   Paulo Fruit  BS,CPht II,MPH on 05/17/2009   Method used:   Samples Given   RxID:   [id number] TRUVADA 200-300 MG TABS (EMTRICITABINE-TENOFOVIR) Take 1 tablet by mouth once a day  #30 x 0   Entered by:   Paulo Fruit  BS,CPht II,MPH   Authorized by:   Acey Lav MD   Signed by:   Paulo Fruit  BS,CPht II,MPH on 05/17/2009   Method used:   Samples Given   RxID:   [id number]   Patient Assist Medication Verification: Medication: Isentress 40mg  QIO#[id number] Exp Date:02 2013 Tech approval:MLD                Patient Assist Medication Verification: Medication:Truvada Lot# [id number] Exp Date:09 2014 Tech approval:MLD Call placed to patient with message that assistance medications are ready for pick-up. Left message. Paulo Fruit  BS,CPht II,MPH  May 17, 2009 2:46 PM

## 2010-04-12 NOTE — Assessment & Plan Note (Signed)
Summary: epigastric pain, relieved slightly by H2O /dde   Primary Provider:  Paulette Blanch Dam MD  CC:  upper abdominal pain radiating to left side x 24hrs and no bowel movement x 4 days.  History of Present Illness: 1-2 day h/o epigastiric pressure and abd pain, nausea without vomiting, gas sensation, and abd bloating.  Admits no BM since Sat. 10/15.  Denies any hemetaemisis or bloody/tarry stools.  Admits sensation starts  after ingestion of food, and he stops eating.  Denies diarrhea, fever or chills.  epgiastric pressure central, non-radiating without any weakness or syncope.   Current Allergies (reviewed today): ! PCN Past History:  Past medical, surgical, family and social histories (including risk factors) reviewed for relevance to current acute and chronic problems.  Past Medical History: Reviewed history from 03/22/2009 and no changes required. Hepatitis C, resolved HIV disease Kaposis sarcoma cutaneous Heat induced facial pruritis (seems to me now likely to be due to Rosacea) Rhinophyma Asymmetric gynecomastia ? due to sustiva Macrocytic anemia Major Depression Bout of alcholism  Past Surgical History: Reviewed history from 06/07/2009 and no changes required. no recetn surgery  Family History: Reviewed history from 07/22/2007 and no changes required. no early cad  Social History: Reviewed history from 03/22/2009 and no changes required. Single Never Smoked Alcohol use-had bout of heavy drinking associated with his depression now sober he claims Part time job  Review of Systems      See HPI General:  Complains of loss of appetite and malaise; denies chills, fatigue, fever, sleep disorder, sweats, weakness, and weight loss. CV:  Denies bluish discoloration of lips or nails, chest pain or discomfort, difficulty breathing at night, difficulty breathing while lying down, fainting, fatigue, leg cramps with exertion, lightheadness, near fainting, palpitations,  shortness of breath with exertion, swelling of feet, swelling of hands, and weight gain. Resp:  Denies chest discomfort, chest pain with inspiration, cough, coughing up blood, excessive snoring, hypersomnolence, morning headaches, pleuritic, shortness of breath, sputum productive, and wheezing. GI:  Complains of abdominal pain, change in bowel habits, constipation, loss of appetite, and nausea; denies bloody stools, dark tarry stools, diarrhea, excessive appetite, gas, hemorrhoids, indigestion, vomiting, vomiting blood, and yellowish skin color. GU:  Denies decreased libido, discharge, dysuria, erectile dysfunction, genital sores, hematuria, incontinence, nocturia, urinary frequency, and urinary hesitancy.  Vital Signs:  Patient profile:   50 year old male Height:      73 inches (185.42 cm) Weight:      161.75 pounds (73.52 kg) BMI:     21.42 Temp:     98.1 degrees F (36.72 degrees C) Pulse rate:   76 / minute BP sitting:   105 / 74  (right arm)  Vitals Entered By: Starleen Arms CMA (December 29, 2009 1:55 PM) CC: upper abdominal pain radiating to left side x 24hrs, no bowel movement x 4 days Is Patient Diabetic? No Pain Assessment Patient in pain? yes     Location: abdomen Intensity: 10 Type: stinging Nutritional Status BMI of 19 -24 = normal  Does patient need assistance? Functional Status Self care Ambulation Normal   Physical Exam  General:  alert, well-developed, appropriate dress, cooperative to examination, good hygiene, and uncomfortable-appearing.   Head:  Normocephalic and atraumatic without obvious abnormalities. No apparent alopecia or balding. Mouth:  Oral mucosa and oropharynx without lesions or exudates.  Teeth in good repair. Neck:  No deformities, masses, or tenderness noted. Chest Wall:  No deformities, masses, tenderness or gynecomastia noted. Breasts:  No masses or  gynecomastia noted Lungs:  Normal respiratory effort, chest expands symmetrically. Lungs  are clear to auscultation, no crackles or wheezes. Heart:  Normal rate and regular rhythm. S1 and S2 normal without gallop, murmur, click, rub or other extra sounds. Abdomen:  no guarding, no rebound tenderness, no abdominal hernia, no inguinal hernia, no splenomegaly, distended, rigidity, hepatomegaly, and epigastric tenderness.   Rectal:  No external abnormalities noted. Normal sphincter tone. No rectal masses or tenderness. Skin:  Intact without suspicious lesions or rashes Cervical Nodes:  No lymphadenopathy noted Axillary Nodes:  No palpable lymphadenopathy Inguinal Nodes:  Shoddy inguinal nodes bilaterally Psych:  Cognition and judgment appear intact. Alert and cooperative with normal attention span and concentration. No apparent delusions, illusions, hallucinations   Impression & Recommendations:  Problem # 1:  ACUTE GASTRITIS WITHOUT MENTION OF HEMORRHAGE (ICD-535.00) Will also entertain gallbladder and pancreatic disorders. Clears to bland, fat-free, lactose-free diet If fever develops, pain increases or abdominal distention increases should go to ER immediately Call if symptoms remain unchanged after the next 3-5 days His updated medication list for this problem includes:    Famotidine 20 Mg Tabs (Famotidine) .Marland Kitchen... 1 tablet two times a day by mouth for next 7 days then once daily for 7 days then as needed  Orders: T-Comprehensive Metabolic Panel [id number]) T-CBC w/Diff ([id number]) T-Lipase ([id number]) TLB-Amylase (82150-AMYL) Est. Patient Level IV (57846)  Problem # 2:  CONSTIPATION (ICD-564.00)  New complaint.  While this is common with an acute abd presentation his symptoms seem to preceed the other presenting symptoms.  We will monitor this first and pend labs obtained today before progressing.  If symptoms outlined above develop or remain unchanged and no BM in 2-3 days he is to call the clinic.  Orders: Est. Patient Level IV (96295)  Medications Added to  Medication List This Visit: 1)  Famotidine 20 Mg Tabs (Famotidine) .Marland Kitchen.. 1 tablet two times a day by mouth for next 7 days then once daily for 7 days then as needed  Patient Instructions: 1)  Clear liquid diet for the next 24 hours, then slowly progress diet as directed 2)  If no BM over the next 2 days, call clinic 3)  May take OTC famotidine at strength and frequency as in Rx. 4)  If symptoms not improved over the next 5 days call clinic 5)  If symptoms worsen, stomach swelling and pain increases and/or develops fever, go to ER or urgent care center. 6)  If symptoms improve follow-up with Dr. Daiva Eves as previously scheduled. Prescriptions: FAMOTIDINE 20 MG TABS (FAMOTIDINE) 1 tablet two times a day by mouth for next 7 days then once daily for 7 days then as needed  #50 x prn   Entered and Authorized by:   Talmadge Chad NP   Signed by:   Talmadge Chad NP on 12/29/2009   Method used:   Print then Give to Patient   RxID:   [id number]

## 2010-04-12 NOTE — Assessment & Plan Note (Signed)
Summary: F/U OV/VS   Visit Type:  Follow-up Primary Provider:  Paulette Blanch Dam MD   History of Present Illness: 50 year old with HIV, Kaposis' patient with good virological control and immune reconstitution. He had flare of depression this winter and had bump in viral load but no resistance found. He is now completely suppressed on raltegravir and truvada. He has no new complaints today. He is doing well, attending AA meetings and has remaines sobert. He is in good spirits.  Problems Prior to Update: 1)  Alcoholism  (ICD-303.90) 2)  Encounter For Therapeutic Drug Monitoring  (ICD-V58.83) 3)  Herpes Zoster  (ICD-053.9) 4)  Neuralgia, Trigeminal  (ICD-350.1) 5)  Polyuria  (ICD-788.42) 6)  Nocturia  (ICD-788.43) 7)  Anemia, Macrocytic, Chronic  (ICD-281.9) 8)  Gynecomastia  (ICD-611.1) 9)  Carbuncle and Furuncle of Unspecified Site  (ICD-680.9) 10)  Leukocytopenia Unspecified  (ICD-288.50) 11)  Rosacea  (ICD-695.3) 12)  Edema  (ICD-782.3) 13)  Paroxysmal Nocturnal Dyspnea  (ICD-786.09) 14)  Health Screening  (ICD-V70.0) 15)  Diarrhea  (ICD-787.91) 16)  Unspecified Pruritic Disorder  (ICD-698.9) 17)  Disorder, Depressive Nec  (ICD-311) 18)  Kaposi's Sarcoma, Skin  (ICD-176.0) 19)  HIV Disease  (ICD-042) 20)  Hepatitis C, Resolved  (ICD-070.51)  Medications Prior to Update: 1)  Truvada 200-300 Mg Tabs (Emtricitabine-Tenofovir) .... Take 1 Tablet By Mouth Once A Day 2)  Isentress 400 Mg Tabs (Raltegravir Potassium) .... Take 1 Tablet By Mouth Two Times A Day 3)  Emla 2.5-2.5 % Crea (Lidocaine-Prilocaine) .... Apply To Face For Burning As Directeed By Dermatologist, Quantity Suffiicient One Month 4)  Valtrex 1 Gm Tabs (Valacyclovir Hcl) .... Three Times A Day For 10 Days 5)  Thiamine Hcl 100 Mg Tabs (Thiamine Hcl) .... Take 1 Tablet By Mouth Once A Day 6)  Multivitamins  Tabs (Multiple Vitamin) .... Take 1 Tablet By Mouth Once A Day 7)  Doxepin Hcl 75 Mg Caps (Doxepin Hcl) ....  Three Tablets At Night 8)  Oxycodone Hcl 5 Mg Tabs (Oxycodone Hcl) .... Take One To Two Tablets As Needed For Pain Daily  Current Medications (verified): 1)  Truvada 200-300 Mg Tabs (Emtricitabine-Tenofovir) .... Take 1 Tablet By Mouth Once A Day 2)  Isentress 400 Mg Tabs (Raltegravir Potassium) .... Take 1 Tablet By Mouth Two Times A Day 3)  Emla 2.5-2.5 % Crea (Lidocaine-Prilocaine) .... Apply To Face For Burning As Directeed By Dermatologist, Quantity Suffiicient One Month 4)  Valtrex 1 Gm Tabs (Valacyclovir Hcl) .... Three Times A Day For 10 Days 5)  Multivitamins  Tabs (Multiple Vitamin) .... Take 1 Tablet By Mouth Once A Day 6)  Doxepin Hcl 75 Mg Caps (Doxepin Hcl) .... Three Tablets At Night 7)  Oxycodone Hcl 5 Mg Tabs (Oxycodone Hcl) .... Take One To Two Tablets As Needed For Pain Daily  Allergies: 1)  ! Pcn    Current Allergies: ! PCN Past History:  Past Medical History: Last updated: 03/22/2009 Hepatitis C, resolved HIV disease Kaposis sarcoma cutaneous Heat induced facial pruritis (seems to me now likely to be due to Rosacea) Rhinophyma Asymmetric gynecomastia ? due to sustiva Macrocytic anemia Major Depression Bout of alcholism  Past Surgical History: Last updated: 06/07/2009 no recetn surgery  Family History: Last updated: 07/22/2007 no early cad  Social History: Last updated: 03/22/2009 Single Never Smoked Alcohol use-had bout of heavy drinking associated with his depression now sober he claims Part time job  Risk Factors: Alcohol Use: 0 (07/29/2009) Caffeine Use: tea, occassional soda (  07/29/2009) Exercise: yes (07/29/2009)  Risk Factors: Smoking Status: never (07/29/2009)  Family History: Reviewed history from 07/22/2007 and no changes required. no early cad  Social History: Reviewed history from 03/22/2009 and no changes required. Single Never Smoked Alcohol use-had bout of heavy drinking associated with his depression now sober he  claims Part time job  Review of Systems  The patient denies anorexia, fever, weight loss, weight gain, vision loss, decreased hearing, hoarseness, chest pain, syncope, dyspnea on exertion, peripheral edema, prolonged cough, headaches, hemoptysis, abdominal pain, melena, hematochezia, severe indigestion/heartburn, hematuria, incontinence, genital sores, muscle weakness, suspicious skin lesions, transient blindness, difficulty walking, depression, unusual weight change, abnormal bleeding, and enlarged lymph nodes.    Vital Signs:  Patient profile:   50 year old male Height:      73 inches (185.42 cm) Weight:      164.8 pounds (74.91 kg) BMI:     21.82 Temp:     98.3 degrees F (36.83 degrees C) oral Pulse rate:   93 / minute BP sitting:   104 / 71  (left arm)  Vitals Entered By: Starleen Arms CMA (October 06, 2009 9:41 AM) Pain Assessment Patient in pain? no      Nutritional Status BMI of 19 -24 = normal   Physical Exam  General:  alert.  well-hydrated.   Head:  normocephalic and no abnormalities observed.   Eyes:  vision grossly intact, pupils equal, pupils round, and pupils reactive to light.   Nose:  rhinophymiano external erythema and no nasal discharge.   Mouth:  pharynx pink and moist, no erythema, and no exudates.   Neck:  supple and full ROM.   Lungs:  normal respiratory effort, normal breath sounds, no crackles, and no wheezes.   Heart:  normal rate.  regular rhythm, no murmur, and no gallop.   Abdomen:  no distention.  soft and non-tender.   Msk:  normal ROM.  no joint deformities.   Neurologic:  alert & oriented X3, strength normal in all extremities, and gait normal.   Skin:  he has chronic ks lesions on legs Psych:  he is in good spirits Oriented X3 and memory intact for recent and remote.          Medication Adherence: 10/06/2009   Adherence to medications reviewed with patient. Counseling to provide adequate adherence provided   Prevention For Positives:  10/06/2009   Safe sex practices discussed with patient. Condoms offered.   Education Materials Provided: 10/06/2009 Safe sex practices discussed with patient. Condoms offered.                          Impression & Recommendations:  Problem # 1:  HIV DISEASE (ICD-042) Assessment Improved  Excellent control! His updated medication list for this problem includes:    Valtrex 1 Gm Tabs (Valacyclovir hcl) .Marland Kitchen... Three times a day for 10 days  Diagnostics Reviewed:  HIV: CDC-defined AIDS (11/23/2008)   CD4: 450 (09/23/2009)   WBC: 2.8 (09/22/2009)   Hgb: 12.6 (09/22/2009)   HCT: 38.3 (09/22/2009)   Platelets: 255 (09/22/2009) HIV genotype: TNP (09/22/2009)   HIV-1 RNA: <48 copies/mL (09/22/2009)   HBSAg: NO (05/07/2006)  Orders: Est. Patient Level IV (19147)  Problem # 2:  ALCOHOLISM (ICD-303.90) Assessment: Improved  sober at present  Orders: Est. Patient Level IV (82956)  Problem # 3:  NEURALGIA, TRIGEMINAL (ICD-350.1) he is obtaining cream for pain  Problem # 4:  DISORDER, DEPRESSIVE NEC (ICD-311)  well  controlled His updated medication list for this problem includes:    Doxepin Hcl 75 Mg Caps (Doxepin hcl) .Marland Kitchen... Three tablets at night  Orders: Est. Patient Level IV (16109)  Other Orders: Future Orders: T-CD4SP ([facility]) (CD4SP) ... 01/04/2010 T-HIV Viral Load [phone]) ... 01/04/2010 T-CBC w/Diff ([id number]) ... 01/04/2010 T-Comprehensive Metabolic Panel [phone]) ... 01/04/2010 T-RPR (Syphilis) [id number]) ... 01/04/2010  Patient Instructions: 1)  rtc in 3 months time   Prescriptions: ISENTRESS 400 MG TABS (RALTEGRAVIR POTASSIUM) Take 1 tablet by mouth two times a day  #60 x 11   Entered and Authorized by:   Acey Lav MD   Signed by:   Paulette Blanch Dam MD on 10/06/2009   Method used:   Electronically to        [facility]  [address] [id number] (retail)       [address]       [place], [place]  [address]       Ph:  [phone]       Fax: [phone]   RxID:   [id number] TRUVADA 200-300 MG TABS (EMTRICITABINE-TENOFOVIR) Take 1 tablet by mouth once a day  #30 x 11   Entered and Authorized by:   Acey Lav MD   Signed by:   Paulette Blanch Dam MD on 10/06/2009   Method used:   Electronically to        [facility]  [address] [id number] (retail)       [address]       [place], [place]  [address]       Ph: [phone]       Fax: [phone]   RxID:   [id number]

## 2010-04-12 NOTE — Progress Notes (Signed)
Summary: [facility]/pt assist meds arrived for Jul  Phone Note Refill Request      Prescriptions: ISENTRESS 400 MG TABS (RALTEGRAVIR POTASSIUM) Take 1 tablet by mouth two times a day  #60 x 0   Entered by:   Paulo Fruit  BS,CPht II,MPH   Authorized by:   Acey Lav MD   Signed by:   Paulo Fruit  BS,CPht II,MPH on 10/08/2009   Method used:   Samples Given   RxID:   [id number] TRUVADA 200-300 MG TABS (EMTRICITABINE-TENOFOVIR) Take 1 tablet by mouth once a day  #30 x 0   Entered by:   Paulo Fruit  BS,CPht II,MPH   Authorized by:   Acey Lav MD   Signed by:   Paulo Fruit  BS,CPht II,MPH on 10/08/2009   Method used:   Samples Given   RxID:   [id number]  Patient Assist Medication Verification: Medication name: Uvaldo Rising 400mg  RX # [id number] Tech approval:MLD  Patient Assist Medication Verification: Medication name:Truvada RX # [id number] Tech approval:MLD  I tried to contact patient on his cell phone; was unable to reach patient because phone is off.  This is the prefered number patient would like to be reached. Paulo Fruit  BS,CPht II,MPH  October 08, 2009 10:40 AM

## 2010-04-12 NOTE — Progress Notes (Signed)
Summary: [facility]/pt assist meds arrived for Jul  Phone Note Refill Request      Prescriptions: ISENTRESS 400 MG TABS (RALTEGRAVIR POTASSIUM) Take 1 tablet by mouth two times a day  #60 x 0   Entered by:   Paulo Fruit  BS,CPht II,MPH   Authorized by:   Acey Lav MD   Signed by:   Paulo Fruit  BS,CPht II,MPH on 09/14/2009   Method used:   Samples Given   RxID:   [id number] TRUVADA 200-300 MG TABS (EMTRICITABINE-TENOFOVIR) Take 1 tablet by mouth once a day  #30 x 0   Entered by:   Paulo Fruit  BS,CPht II,MPH   Authorized by:   Acey Lav MD   Signed by:   Paulo Fruit  BS,CPht II,MPH on 09/14/2009   Method used:   Samples Given   RxID:   [id number]  Patient Assist Medication Verification: Medication name: Truvada RX # [id number] Tech approval:MLD  Patient Assist Medication Verification: Medication name:Isentress 400mg  RX # [id number] Tech approval:MLD **Patient picked up medications from last May last week** Call placed to patient with message that assistance medications are ready for pick-up. Paulo Fruit  BS,CPht II,MPH  September 14, 2009 10:14 AM

## 2010-04-12 NOTE — Progress Notes (Signed)
Summary: [facility]/pt assist meds arrived for Jan  Phone Note Refill Request      Prescriptions: ISENTRESS 400 MG TABS (RALTEGRAVIR POTASSIUM) Take 1 tablet by mouth two times a day  #60 x 0   Entered by:   Paulo Fruit  BS,CPht II,MPH   Authorized by:   Acey Lav MD   Signed by:   Paulo Fruit  BS,CPht II,MPH on 03/17/2009   Method used:   Samples Given   RxID:   [id number] TRUVADA 200-300 MG TABS (EMTRICITABINE-TENOFOVIR) Take 1 tablet by mouth once a day  #30 x 0   Entered by:   Paulo Fruit  BS,CPht II,MPH   Authorized by:   Acey Lav MD   Signed by:   Paulo Fruit  BS,CPht II,MPH on 03/17/2009   Method used:   Samples Given   RxID:   [id number]   Patient Assist Medication Verification: Medication: Isentress 400mg  Lot# [id number] Exp Date:11 2012 Tech approval:MLD                Patient Assist Medication Verification: Medication:Truvada Lot# [id number] Exp Date:05 2014 Tech approval:MLD Call placed to patient with message that assistance medications are ready for pick-up. Left message Paulo Fruit  BS,CPht II,MPH  March 17, 2009 3:12 PM                   Appended Document: [facility]/pt assist meds arrived for Jan Prescription/Samples picked up by: patient

## 2010-04-14 NOTE — Progress Notes (Signed)
Summary: [facility]AP meds arrived, pt notified   Prescriptions: ISENTRESS 400 MG TABS (RALTEGRAVIR POTASSIUM) Take 1 tablet by mouth two times a day  #60 x 0   Entered by:   Jimmy Footman, CMA   Authorized by:   Acey Lav MD   Signed by:   Jimmy Footman, CMA on 02/28/2010   Method used:   Samples Given   RxID:   [id number] TRUVADA 200-300 MG TABS (EMTRICITABINE-TENOFOVIR) Take 1 tablet by mouth once a day  #30 x 0   Entered by:   Jimmy Footman, CMA   Authorized by:   Acey Lav MD   Signed by:   Jimmy Footman, CMA on 02/28/2010   Method used:   Samples Given   RxID:   [id number]   Appended Document: [facility]AP meds arrived, pt notified  Pt. picked up rxes

## 2010-05-02 ENCOUNTER — Other Ambulatory Visit: Payer: Self-pay

## 2010-05-24 ENCOUNTER — Encounter: Payer: Self-pay | Admitting: Infectious Disease

## 2010-05-26 LAB — CULTURE, ROUTINE-ABSCESS

## 2010-05-30 LAB — T-HELPER CELL (CD4) - (RCID CLINIC ONLY)
CD4 % Helper T Cell: 33 % (ref 33–55)
CD4 T Cell Abs: 490 uL (ref 400–2700)

## 2010-06-05 LAB — T-HELPER CELL (CD4) - (RCID CLINIC ONLY): CD4 % Helper T Cell: 27 % — ABNORMAL LOW (ref 33–55)

## 2010-06-06 ENCOUNTER — Ambulatory Visit: Payer: Self-pay | Admitting: Infectious Disease

## 2010-06-13 LAB — T-HELPER CELL (CD4) - (RCID CLINIC ONLY)
CD4 % Helper T Cell: 31 % — ABNORMAL LOW (ref 33–55)
CD4 T Cell Abs: 410 uL (ref 400–2700)

## 2010-06-14 LAB — COMPREHENSIVE METABOLIC PANEL
ALT: 78 U/L — ABNORMAL HIGH (ref 0–53)
AST: 65 U/L — ABNORMAL HIGH (ref 0–37)
Albumin: 4.2 g/dL (ref 3.5–5.2)
Alkaline Phosphatase: 73 U/L (ref 39–117)
CO2: 27 mEq/L (ref 19–32)
Chloride: 101 mEq/L (ref 96–112)
GFR calc Af Amer: >60 mL/min (ref >60)
GFR calc non Af Amer: >60 mL/min (ref >60)
Potassium: 4.3 mEq/L (ref 3.5–5.1)
Sodium: 135 mEq/L (ref 135–145)
Total Bilirubin: 0.5 mg/dL (ref 0.3–1.2)

## 2010-06-14 LAB — CBC
MCV: 102.2 fL — ABNORMAL HIGH (ref 78.0–100.0)
Platelets: 247 10*3/uL (ref 150–400)
RBC: 3.82 MIL/uL — ABNORMAL LOW (ref 4.22–5.81)
WBC: 3 10*3/uL — ABNORMAL LOW (ref 4.0–10.5)

## 2010-06-14 LAB — LIPID PANEL
HDL: 63 mg/dL (ref >39)
Triglycerides: 60 mg/dL (ref <150)
VLDL: 12 mg/dL (ref 0–40)

## 2010-06-14 LAB — GLUCOSE, RANDOM: Glucose, Bld: 89 mg/dL (ref 70–99)

## 2010-06-14 LAB — TSH: TSH: 0.563 u[IU]/mL (ref 0.350–4.500)

## 2010-06-20 LAB — T-HELPER CELL (CD4) - (RCID CLINIC ONLY): CD4 T Cell Abs: 450 uL (ref 400–2700)

## 2010-06-22 LAB — T-HELPER CELL (CD4) - (RCID CLINIC ONLY)
CD4 % Helper T Cell: 27 % — ABNORMAL LOW (ref 33–55)
CD4 T Cell Abs: 340 uL — ABNORMAL LOW (ref 400–2700)

## 2010-06-28 LAB — BONE MARROW EXAM

## 2010-06-28 LAB — CBC
HCT: 34.8 % — ABNORMAL LOW (ref 39.0–52.0)
Hemoglobin: 12 g/dL — ABNORMAL LOW (ref 13.0–17.0)
MCV: 100.2 fL — ABNORMAL HIGH (ref 78.0–100.0)
Platelets: 180 10*3/uL (ref 150–400)
RBC: 3.48 MIL/uL — ABNORMAL LOW (ref 4.22–5.81)
WBC: 2.8 10*3/uL — ABNORMAL LOW (ref 4.0–10.5)

## 2010-06-28 LAB — APTT: aPTT: 23 seconds — ABNORMAL LOW (ref 24–37)

## 2010-06-28 LAB — CHROMOSOME ANALYSIS, BONE MARROW

## 2010-07-04 ENCOUNTER — Other Ambulatory Visit (INDEPENDENT_AMBULATORY_CARE_PROVIDER_SITE_OTHER): Payer: Self-pay

## 2010-07-04 DIAGNOSIS — B20 Human immunodeficiency virus [HIV] disease: Secondary | ICD-10-CM

## 2010-07-05 ENCOUNTER — Other Ambulatory Visit: Payer: Self-pay

## 2010-07-05 LAB — CBC WITH DIFFERENTIAL/PLATELET
Basophils Absolute: 0 10*3/uL (ref 0.0–0.1)
Basophils Relative: 0 % (ref 0–1)
Eosinophils Relative: 1 % (ref 0–5)
HCT: 39.5 % (ref 39.0–52.0)
MCHC: 33.4 g/dL (ref 30.0–36.0)
Monocytes Absolute: 0.3 10*3/uL (ref 0.1–1.0)
Neutro Abs: 1.2 10*3/uL — ABNORMAL LOW (ref 1.7–7.7)
Platelets: 300 10*3/uL (ref 150–400)
RDW: 12.7 % (ref 11.5–15.5)

## 2010-07-05 LAB — COMPLETE METABOLIC PANEL WITH GFR
AST: 19 U/L (ref 0–37)
Alkaline Phosphatase: 71 U/L (ref 39–117)
BUN: 14 mg/dL (ref 6–23)
Creat: 1.14 mg/dL (ref 0.40–1.50)
Potassium: 5.2 mEq/L (ref 3.5–5.3)

## 2010-07-05 LAB — T-HELPER CELL (CD4) - (RCID CLINIC ONLY)
CD4 % Helper T Cell: 36 % (ref 33–55)
CD4 T Cell Abs: 610 uL (ref 400–2700)

## 2010-07-06 LAB — HIV-1 RNA QUANT-NO REFLEX-BLD: HIV-1 RNA Quant, Log: <1.3 {Log} (ref <1.30)

## 2010-07-19 ENCOUNTER — Ambulatory Visit: Payer: Self-pay | Admitting: Infectious Disease

## 2010-07-19 ENCOUNTER — Telehealth: Payer: Self-pay | Admitting: *Deleted

## 2010-07-21 NOTE — Telephone Encounter (Signed)
Opened in error

## 2010-07-26 ENCOUNTER — Ambulatory Visit (INDEPENDENT_AMBULATORY_CARE_PROVIDER_SITE_OTHER): Payer: Self-pay | Admitting: Infectious Disease

## 2010-07-26 VITALS — BP 114/79 | HR 93 | Temp 99.0°F | Ht 74.0 in | Wt 150.5 lb

## 2010-07-26 DIAGNOSIS — F1020 Alcohol dependence, uncomplicated: Secondary | ICD-10-CM

## 2010-07-26 DIAGNOSIS — B20 Human immunodeficiency virus [HIV] disease: Secondary | ICD-10-CM

## 2010-07-26 DIAGNOSIS — F329 Major depressive disorder, single episode, unspecified: Secondary | ICD-10-CM

## 2010-07-26 MED ORDER — AMITRIPTYLINE HCL 25 MG PO TABS: 25.0000 mg | ORAL_TABLET | Freq: Every day | ORAL | Status: DC

## 2010-07-26 NOTE — Assessment & Plan Note (Signed)
He claims to be clean. He does not want to go to Alcoholics Anonymous. I do think he clearly needs a counselor in help with his depression I do worry that he could lapse and alcoholism which was a disaster for him previously.

## 2010-07-26 NOTE — Progress Notes (Signed)
Subjective:    Patient ID: Arthur Lynch, male    DOB: 17-Apr-1960, 50 y.o.   MRN: [id number]  HPI 50 year old man who has been quite well controlled on isentress and truvada. He has a past medical history significant for depression and prior polysubstance abuse including alcohol use as well as prior suicide attempt. Unfortunately he has once again become quite depressed. Much of his depression relates to a recent relationship he has had with another young man. Relationship this point time has not been sexually nature it was not overtly so. Arthur Lynch had helped his friend in finding housing also find employment at his current job. 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 This appears to have precipitated depression and Arthur Lynch and he has been losing weight up to 8 pounds over the last several weeks he is having difficulty sleeping he has a depressed mood and has feelings of guilt. He requested I start him back on antidepressant medication specifically asked that I place him back on amitriptyline he was on previously prescribed by Arthur Lynch. He denied any active or passive suicidal ideation he contracted for safety with me. I told him I was willing to prescribe the Elavil but that there was a risk with this drug should be become suicidal that it could be dangerous for him to take it and at an overdose amounts. I told them we'll reluctantly willing to prescribe it under the strict constraints that he understand that there is a risk should he try an overdose and that he contracts with me for safety and immediate left knee that may now be having any thoughts of trying to harm himself. The patient has specifically requested this medication because it he felt it helped with his depression his insomnia and also with some of his neuropathic pain.  I spent a total t greater than 45 minutes  with this pt including greater than 50% of time in face to face counselling and coordination of car   Review of Systems  Constitutional: Positive for activity change, appetite change, fatigue and unexpected weight change. Negative for fever, chills and diaphoresis.  HENT: Negative for hearing loss, nosebleeds, congestion, facial swelling, rhinorrhea, neck pain, neck stiffness and postnasal drip.   Eyes: Negative for photophobia, redness and visual disturbance.  Respiratory: Negative for apnea, cough, choking, chest tightness, shortness of breath, wheezing and stridor.   Cardiovascular: Negative for chest pain, palpitations and leg swelling.  Gastrointestinal: Negative for nausea, vomiting, abdominal pain, diarrhea, constipation, blood in stool, abdominal distention and anal bleeding.  Genitourinary: Negative for dysuria, hematuria and flank pain.  Musculoskeletal: Negative for myalgias, back pain, arthralgias and gait problem.  Skin: Negative for color change, pallor and rash.  Neurological: Negative for dizziness, seizures, facial asymmetry, speech difficulty, weakness, light-headedness, numbness and headaches.  Hematological: Negative for adenopathy. Does not bruise/bleed easily.  Psychiatric/Behavioral: Positive for sleep disturbance, dysphoric mood and decreased concentration. Negative for suicidal ideas, hallucinations, confusion, self-injury and agitation. The patient is nervous/anxious. The patient is not hyperactive.        Objective:   Physical Exam  Constitutional: He is oriented to person, place, and time. No distress.  HENT:  Head: Normocephalic and atraumatic.  Mouth/Throat: Oropharynx is clear and moist. No oropharyngeal exudate.  Eyes: Conjunctivae and EOM are normal. Pupils are equal, round, and reactive to light. No scleral icterus.  Neck: Normal range of motion. Neck supple.  Pulmonary/Chest: No respiratory distress. He has no wheezes. He has no rales. He exhibits no  tenderness.  Abdominal: He exhibits no distension and no mass. There is no tenderness. There is no rebound and no guarding.  Musculoskeletal: He exhibits no edema and no tenderness.  Lymphadenopathy:    He has no cervical adenopathy.  Neurological: He is alert and oriented to person, place, and time. He has normal reflexes.  Skin: Skin is warm and dry. No rash noted. He is not diaphoretic. No erythema. No pallor.  Psychiatric: His mood appears anxious. His affect is labile. His affect is not inappropriate. His speech is not rapid and/or pressured, not delayed, not tangential and not slurred. He is withdrawn. He is not agitated, not aggressive, is not hyperactive, not slowed, not actively hallucinating and not combative. Thought content is not paranoid and not delusional. Cognition and memory are not impaired. He does not express impulsivity or inappropriate judgment. He exhibits a depressed mood. He expresses no homicidal and no suicidal ideation. He expresses no suicidal plans and no homicidal plans. He is communicative. He exhibits normal recent memory and normal remote memory. He is attentive.          Assessment & Plan:  DISORDER, DEPRESSIVE NEC I really do worry about [name]. This is probably his most dangerous and comorbid condition. I will restart him on low-dose Elavil to help with depression and insomnia and neuropathic pain. Avastin to see on one of our case managers and to get plugged back into the therapist he was seen him previously.  ALCOHOLISM He claims to be clean. He does not want to go to Alcoholics Anonymous. I do think he clearly needs a counselor in help with his depression I do worry that he could lapse and alcoholism which was a disaster for him previously.  HIV DISEASE HIV has been quite well controlled up until last few weeks. Because of it if his depression he failed to enroll in a drug assistance program and therefore has been without antiretroviral medications for  3 weeks. When last checked he had undetectable viral load and healthy CD4 count. He should be meeting with Arthur Lynch today to make sure he is plugged in to the assistance program.

## 2010-07-26 NOTE — Assessment & Plan Note (Signed)
HIV has been quite well controlled up until last few weeks. Because of it if his depression he failed to enroll in a drug assistance program and therefore has been without antiretroviral medications for 3 weeks. When last checked he had undetectable viral load and healthy CD4 count. He should be meeting with Britta Mccreedy today to make sure he is plugged in to the assistance program.

## 2010-07-26 NOTE — Assessment & Plan Note (Signed)
I really do worry about Arthur Lynch. This is probably his most dangerous and comorbid condition. I will restart him on low-dose Elavil to help with depression and insomnia and neuropathic pain. Avastin to see on one of our case managers and to get plugged back into the therapist he was seen him previously.

## 2010-09-16 ENCOUNTER — Other Ambulatory Visit: Payer: Self-pay

## 2010-09-19 ENCOUNTER — Other Ambulatory Visit: Payer: Self-pay | Admitting: *Deleted

## 2010-09-19 ENCOUNTER — Other Ambulatory Visit: Payer: Self-pay | Admitting: Licensed Clinical Social Worker

## 2010-09-19 DIAGNOSIS — B20 Human immunodeficiency virus [HIV] disease: Secondary | ICD-10-CM

## 2010-09-19 MED ORDER — EMTRICITABINE-TENOFOVIR DF 200-300 MG PO TABS: 1.0000 | ORAL_TABLET | Freq: Every day | ORAL | Status: DC

## 2010-09-19 MED ORDER — RALTEGRAVIR POTASSIUM 400 MG PO TABS: 400.0000 mg | ORAL_TABLET | Freq: Two times a day (BID) | ORAL | Status: DC

## 2010-09-20 ENCOUNTER — Other Ambulatory Visit (INDEPENDENT_AMBULATORY_CARE_PROVIDER_SITE_OTHER): Payer: Self-pay

## 2010-09-20 DIAGNOSIS — B20 Human immunodeficiency virus [HIV] disease: Secondary | ICD-10-CM

## 2010-09-20 DIAGNOSIS — Z113 Encounter for screening for infections with a predominantly sexual mode of transmission: Secondary | ICD-10-CM

## 2010-09-21 ENCOUNTER — Other Ambulatory Visit: Payer: Self-pay

## 2010-09-21 LAB — COMPREHENSIVE METABOLIC PANEL
Albumin: 4.3 g/dL (ref 3.5–5.2)
BUN: 15 mg/dL (ref 6–23)
CO2: 27 mEq/L (ref 19–32)
Calcium: 9.5 mg/dL (ref 8.4–10.5)
Chloride: 104 mEq/L (ref 96–112)
Glucose, Bld: 115 mg/dL — ABNORMAL HIGH (ref 70–99)
Potassium: 3.9 mEq/L (ref 3.5–5.3)
Sodium: 141 mEq/L (ref 135–145)
Total Protein: 7.5 g/dL (ref 6.0–8.3)

## 2010-09-21 LAB — LIPID PANEL
HDL: 58 mg/dL (ref >39)
Triglycerides: 57 mg/dL (ref <150)

## 2010-09-21 LAB — HIV-1 RNA QUANT-NO REFLEX-BLD
HIV 1 RNA Quant: <20 copies/mL (ref <20)
HIV-1 RNA Quant, Log: <1.3 {Log} (ref <1.30)

## 2010-09-30 ENCOUNTER — Ambulatory Visit: Payer: Self-pay

## 2010-09-30 ENCOUNTER — Ambulatory Visit (INDEPENDENT_AMBULATORY_CARE_PROVIDER_SITE_OTHER): Payer: Self-pay | Admitting: Adult Health

## 2010-09-30 ENCOUNTER — Inpatient Hospital Stay (INDEPENDENT_AMBULATORY_CARE_PROVIDER_SITE_OTHER)
Admission: RE | Admit: 2010-09-30 | Discharge: 2010-09-30 | Disposition: A | Discharge status: Home / Self Care | Payer: Self-pay | Source: Ambulatory Visit | Attending: Family Medicine | Admitting: Family Medicine

## 2010-09-30 ENCOUNTER — Ambulatory Visit: Payer: Self-pay | Admitting: Infectious Disease

## 2010-09-30 ENCOUNTER — Encounter: Payer: Self-pay | Admitting: Adult Health

## 2010-09-30 VITALS — BP 109/76 | HR 75 | Temp 98.2°F | Wt 146.4 lb

## 2010-09-30 DIAGNOSIS — D2320 Other benign neoplasm of skin of unspecified ear and external auricular canal: Secondary | ICD-10-CM

## 2010-09-30 DIAGNOSIS — H60399 Other infective otitis externa, unspecified ear: Secondary | ICD-10-CM

## 2010-09-30 NOTE — Progress Notes (Signed)
  Subjective:    Patient ID: Robertson Colclough, male    DOB: April 20, 1960, 50 y.o.   MRN: [id number]  HPI Mr. Gustafson comes to clinic with the chief complaint of cyst to the left earlobe. That has been increasing in size with pain and tenderness. For the past week. He states he has a history of this and in the past, he's had these cysts excised by his clinic doctor. He denies any fever or drainage from the sites.   Review of Systems  Skin:       Cystic lesions as described in history of present illness       Objective:   Physical Exam Left ear lobe, enlarged, inflamed, tender to touch with localized heat, but without any active drainage. No other lesions noted elsewhere on face, head, neck or body.       Assessment & Plan:  Cystic Lesion to Left Earlobe. Given the size, extent and location of this cyst, we recommend either be referred to Gen. surgery or be seen in the ED, where this can be managed in a more controlled fashion, especially should there be required. Any suturing. Post procedure. Although he states that Dr. Algis Liming has excised before, I emphasized that the location, along should require someone to drain the wound and cleaned completely. He verbally acknowledged this and stated he would go to urgent care to have them evaluate this.

## 2010-10-03 LAB — CULTURE, ROUTINE-ABSCESS

## 2010-10-05 ENCOUNTER — Ambulatory Visit: Payer: Self-pay | Admitting: Infectious Disease

## 2010-10-24 ENCOUNTER — Ambulatory Visit: Payer: Self-pay

## 2010-11-02 ENCOUNTER — Telehealth: Payer: Self-pay | Admitting: *Deleted

## 2010-11-02 ENCOUNTER — Ambulatory Visit (INDEPENDENT_AMBULATORY_CARE_PROVIDER_SITE_OTHER): Payer: Self-pay | Admitting: Infectious Disease

## 2010-11-02 ENCOUNTER — Encounter: Payer: Self-pay | Admitting: Infectious Disease

## 2010-11-02 VITALS — BP 108/72 | HR 76 | Temp 97.3°F | Ht 74.0 in | Wt 147.8 lb

## 2010-11-02 DIAGNOSIS — Z23 Encounter for immunization: Secondary | ICD-10-CM

## 2010-11-02 DIAGNOSIS — Z0000 Encounter for general adult medical examination without abnormal findings: Secondary | ICD-10-CM

## 2010-11-02 DIAGNOSIS — Q181 Preauricular sinus and cyst: Secondary | ICD-10-CM

## 2010-11-02 DIAGNOSIS — B20 Human immunodeficiency virus [HIV] disease: Secondary | ICD-10-CM

## 2010-11-02 DIAGNOSIS — F329 Major depressive disorder, single episode, unspecified: Secondary | ICD-10-CM

## 2010-11-02 DIAGNOSIS — G500 Trigeminal neuralgia: Secondary | ICD-10-CM

## 2010-11-02 DIAGNOSIS — L723 Sebaceous cyst: Secondary | ICD-10-CM

## 2010-11-02 DIAGNOSIS — M792 Neuralgia and neuritis, unspecified: Secondary | ICD-10-CM | POA: Insufficient documentation

## 2010-11-02 MED ORDER — DULOXETINE HCL 30 MG PO CPEP: 30.0000 mg | ORAL_CAPSULE | Freq: Every day | ORAL | Status: DC

## 2010-11-02 MED ORDER — OXYCODONE-ACETAMINOPHEN 10-400 MG PO TABS: 1.0000 | ORAL_TABLET | Freq: Every evening | ORAL | Status: DC | PRN

## 2010-11-02 NOTE — Telephone Encounter (Signed)
He brought the letter from unemployment showing how much he is getting  ($243 per week). Mailed to [employer] to get the cymbalta

## 2010-11-02 NOTE — Assessment & Plan Note (Signed)
Superb control continue current regimen 

## 2010-11-02 NOTE — Telephone Encounter (Signed)
Pt states the oxycodone 10mg /tylenol 400mg  is not in stock at [facility]. Requests that it be changed Uses [facility] on [address]

## 2010-11-02 NOTE — Assessment & Plan Note (Signed)
He has had superinfection of this structure with 3 I&D's he has only had some diphtheroids and other multiple bacteria without staph or strep having been isolated. He has an ear nose and throat surgeon appointment

## 2010-11-02 NOTE — Telephone Encounter (Signed)
He was rx cymbalta at this visit. Lost his job 2 weeks ago. Pap prepared. He will briung his letter from unemployment showing how much he gets. Will then send to lilly

## 2010-11-02 NOTE — Progress Notes (Signed)
Subjective:    Patient ID: Arthur Lynch, male    DOB: 1960/10/21, 50 y.o.   MRN: [id number]  HPI  50 year old man who has been quite well controlled on isentress and truvada. He has a past medical history significant for depression and prior polysubstance abuse including alcohol use as well as prior suicide attempt. Unfortunately he has once again become quite depressed. Much of his depression relates to a recent relationship he has had with another young man. Relationship this point time has not been sexually nature it was not overtly so. Arthur Lynch had helped his friend in finding housing also find employment at his current job. 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 This appears to have precipitated depression and Arthur Lynch and he has been losing weight up to 8 pounds over the last several weeks he is having difficulty sleeping he had a depressed mood and has feelings of guilt. I restarted him on amitriptyline. Unfortunately he is continuing to have totally sleeping and then gently and he feels made things worse and makes him stay up later. He continues to have trouble with his trigeminal neuralgia which persists. I've offered her prescription narcotic to alleviate the pain and feels justified given the chronic unabated nature of this trigeminal neuralgia. Possible pros Cymbalta to treat his depression and clots and pain modulation while dispensed and amitriptyline. He was seen recently also numerous department due to a superinfection of this cystic structure on his year. He was given doxycycline the cultures grew multiple morphotypes multiple bacteria without a staph or strep being isolated. He has an appointment with ear nose and throat surgery for more formal treatment at this cystic structure in his ear. Of note he had I&D 3 times in the last 5 years. Over 45 minutes spent with  the pt including greater than 50% of time in face to face counselling of the pt and in coordination of care.  Review of Systems  Constitutional: Negative for fever, chills, diaphoresis, activity change, appetite change, fatigue and unexpected weight change.  HENT: Negative for congestion, sore throat, rhinorrhea, sneezing, trouble swallowing and sinus pressure.   Eyes: Negative for photophobia and visual disturbance.  Respiratory: Negative for cough, chest tightness, shortness of breath, wheezing and stridor.   Cardiovascular: Negative for chest pain, palpitations and leg swelling.  Gastrointestinal: Negative for nausea, vomiting, abdominal pain, diarrhea, constipation, blood in stool, abdominal distention and anal bleeding.  Genitourinary: Negative for dysuria, hematuria, flank pain and difficulty urinating.  Musculoskeletal: Negative for myalgias, back pain, joint swelling, arthralgias and gait problem.  Skin: Negative for color change, pallor, rash and wound.  Neurological: Negative for dizziness, tremors, weakness and light-headedness.  Hematological: Negative for adenopathy. Does not bruise/bleed easily.  Psychiatric/Behavioral: Negative for behavioral problems, confusion, sleep disturbance, dysphoric mood, decreased concentration and agitation.       Objective:   Physical Exam  Constitutional: He is oriented to person, place, and time. He appears well-developed and well-nourished. No distress.  HENT:  Head: Normocephalic and atraumatic.  Mouth/Throat: Oropharynx is clear and moist. No oropharyngeal exudate.       Rhinphymia, ear with cystic structure reduces in size  Eyes: Conjunctivae and EOM are normal. Pupils are equal, round, and reactive to light. No scleral icterus.  Neck: Normal range of motion. Neck supple. No JVD present.  Cardiovascular: Normal rate, regular rhythm and  normal heart sounds.  Exam reveals no gallop and no friction rub.   No murmur heard. Pulmonary/Chest:  Effort normal and breath sounds normal. No respiratory distress. He has no wheezes. He has no rales. He exhibits no tenderness.  Abdominal: He exhibits no distension and no mass. There is no tenderness. There is no rebound and no guarding.  Musculoskeletal: He exhibits no edema and no tenderness.  Lymphadenopathy:    He has no cervical adenopathy.  Neurological: He is alert and oriented to person, place, and time. He has normal reflexes. He exhibits normal muscle tone. Coordination normal.  Skin: Skin is warm and dry. He is not diaphoretic. No erythema. No pallor.  Psychiatric: He has a normal mood and affect. His behavior is normal. Judgment and thought content normal.          Assessment & Plan:  HIV DISEASE Superb control continue current regimen.  DISORDER, DEPRESSIVE NEC Start Cymbalta apply for patient assistance for this.  NEURALGIA, TRIGEMINAL Cymbalta is being started also given a perception narcotic.  Cyst on ear He has had superinfection of this structure with 3 I&D's he has only had some diphtheroids and other multiple bacteria without staph or strep having been isolated. He has an ear nose and throat surgeon appointment

## 2010-11-02 NOTE — Assessment & Plan Note (Signed)
Start Cymbalta apply for patient assistance for this.

## 2010-11-02 NOTE — Assessment & Plan Note (Signed)
Cymbalta is being started also given a perception narcotic.

## 2010-11-03 ENCOUNTER — Other Ambulatory Visit: Payer: Self-pay | Admitting: Infectious Disease

## 2010-11-03 DIAGNOSIS — M792 Neuralgia and neuritis, unspecified: Secondary | ICD-10-CM

## 2010-11-03 MED ORDER — HYDROCODONE-ACETAMINOPHEN 5-500 MG PO TABS: 1.0000 | ORAL_TABLET | Freq: Every day | ORAL | Status: DC

## 2010-11-03 NOTE — Telephone Encounter (Signed)
Changed in computer to vicodin. Can you fax or call it to them in the am? Thanks

## 2010-11-04 NOTE — Telephone Encounter (Signed)
Called the vicodin in

## 2010-11-18 NOTE — Telephone Encounter (Signed)
Application was approved for Cymbalta and the patient is eligible through 11/09/11.

## 2010-11-21 ENCOUNTER — Telehealth: Payer: Self-pay | Admitting: *Deleted

## 2010-11-21 NOTE — Telephone Encounter (Signed)
Pt started Cymbalta rx 11/17/10.  Began having frontal headaches daily since 11/18/10, "3-4" on pain scale.  He stated that he is also under "stress" at this time.  Feels as though the headaches may be "tension/stress" related but wanted to talk with this office.  The pt shared that he has not taken anything to help with the headaches so far.  RN advised that the pt try Tylenol per the package instructions for the headaches.  RN advised the pt to call Denyce Robert, Mental Health Counselor to arrange an appt to discuss his current "stress." Pt given business card for [employer].  RN asked the pt to call the [facility] next week to let us know how he is feeling after starting the Tylenol.   Pt verbalized understanding and stated that he would call [employer].  Jennet Maduro, RN

## 2010-11-21 NOTE — Telephone Encounter (Signed)
I thought I gave him a script for a narcotic as well?

## 2010-11-25 ENCOUNTER — Encounter: Payer: Self-pay | Admitting: Adult Health

## 2010-11-25 ENCOUNTER — Ambulatory Visit (INDEPENDENT_AMBULATORY_CARE_PROVIDER_SITE_OTHER): Payer: Self-pay | Admitting: Adult Health

## 2010-11-25 DIAGNOSIS — B029 Zoster without complications: Secondary | ICD-10-CM

## 2010-11-25 DIAGNOSIS — B20 Human immunodeficiency virus [HIV] disease: Secondary | ICD-10-CM

## 2010-11-25 DIAGNOSIS — B0229 Other postherpetic nervous system involvement: Secondary | ICD-10-CM

## 2010-11-25 MED ORDER — HYDROCODONE-IBUPROFEN 7.5-200 MG PO TABS: 1.0000 | ORAL_TABLET | Freq: Three times a day (TID) | ORAL | Status: AC | PRN

## 2010-11-25 MED ORDER — VALACYCLOVIR HCL 1 G PO TABS: 1000.0000 mg | ORAL_TABLET | Freq: Three times a day (TID) | ORAL | Status: DC

## 2010-11-25 NOTE — Progress Notes (Signed)
  Subjective:    Patient ID: Arthur Lynch, male    DOB: 1960-11-23, 50 y.o.   MRN: [id number]  HPI Presented to clinic today with a three-day history of small vesicle formation along the trigeminal tract of the right side of the face, involving mostly the nasolabial fold region and the tip of the nose. States vesicles have since crusted, but pain still persists. He denies any ocular involvement or any visual disturbances. Also denies palsy or facial asymmetry. Denies, fevers, chills, sweats, but does relate a severe, throbbing, aching pain in that same area.   Review of Systems As per history of present illness    Objective:   Physical Exam Encrusted lesions involving the right nasolabial full, and along the trigeminal tract but without any ocular involvement of the right eye. EOMs were full and equal. Visual acuity and visual fields are normal. Tactile hypersensitivity to the right eye was noted.       Assessment & Plan:  1. Resolving Resolving, H. Zoster to Right Facial and Trigeminal Tract with Postherpetic Neuralgia. 2 to his symptoms of pain. We will give him a course of telemetry, cycle. Here, 1.0 g, by mouth, 3 times a day for the next week. Additionally, provided for him hydrocodone/ibuprofen 7.5/200 one by mouth every 8 hours when necessary pain, dispense 30. Followup with primary care provider at next scheduled visit or contact clinic if symptoms progress or fail to improve.  Verbal instructions and written material on shingles were provided. He verbally acknowledged all information that was provided for him and agreed with plan of care.

## 2010-11-25 NOTE — Patient Instructions (Signed)
Shingles (Herpes Zoster)  Shingles is caused by the same virus that causes chicken pox (varicella zoster virus or VZV). Shingles often occurs many years or decades after having chicken pox. That is why it is more common in adults older than 50 years. The virus reactivates and breaks out as an infection in a nerve root.  SYMPTOMS   The initial feeling (sensations) may be pain. This pain is usually described as:    Burning.    Stabbing.    Throbbing.    Tingling in the nerve root.    A red rash will follow in a couple days. The rash may occur in any area of the body and is usually on one side (unilateral) of the body in a band or belt-like pattern. The rash usually starts out as very small blisters (vesicles). They will dry up after 7 to 10 days. This is not usually a significant problem except for the pain it causes.    Long lasting (chronic) pain is more likely in an elderly person. It can last months to years. This condition is called post-herpetic neuralgia.   Shingles can be an extremely severe infection in someone with AIDS, a weakened immune system or with forms of leukemia. It can also be severe if you are taking transplant medications or other medications that weaken the immune system.  TREATMENT  Your caregiver will often treat you with:   Antiviral drugs.    Anti-inflammatory drugs.    Pain medications.    Bed rest is very important in preventing the pain associated with herpes zoster (post-herpetic neuralgia).    Application of heat in the form of a hot-water bottle or electric heating pad or gentle pressure with the hand is recommended to help with the pain or discomfort.   PREVENTION  A varicella zoster vaccine is available to help protect against the virus. The Food and Drug Administration approved the varicella zoster vaccine for individuals 50 years of age and older.  HOME CARE INSTRUCTIONS   Cool compresses to the area of rash may be helpful.    Only take over-the-counter or  prescription medicines for pain, discomfort or fever as directed by your caregiver.    Avoid contact with:    Babies.    Pregnant women.    Children with eczema.    Elderly people with transplants.    People with chronic illnesses, such as leukemia and AIDS.    If the area involved is on your face, you may receive a referral for follow-up to a specialist. It is very important to keep all follow-up appointments. This will help avoid eye complications, chronic pain or disability.   SEEK IMMEDIATE MEDICAL CARE IF:   You develop any pain (headache) in the area of the face or eye. This must be followed carefully by your caregiver or ophthalmologist. An infection in part of your eye (cornea) can be very serious. It could lead to blindness.    You do not have pain relief from prescribed medications.    The redness or swelling spreads.    The area involved becomes very swollen and painful.    You have an oral temperature above 102 F (38 C), not controlled by medicine.    You notice any red or painful lines extending away from the affected area toward your heart (lymphangitis).    Your condition is worsening or has changed.   Document Released: 02/27/2005 Document Re-Released: 08/17/2009  [employer] Patient Information 2011 [employer].

## 2010-11-29 ENCOUNTER — Telehealth: Payer: Self-pay | Admitting: *Deleted

## 2010-11-29 NOTE — Telephone Encounter (Signed)
Unable to find a pt assistance program for his pain meds. Arthur Lynch will cover oxycontin or butran.  [employer] will cover hydromorphone.  Please advise if you want to change meds to any of these

## 2010-12-06 LAB — T-HELPER CELL (CD4) - (RCID CLINIC ONLY): CD4 % Helper T Cell: 23 — ABNORMAL LOW

## 2010-12-08 NOTE — Telephone Encounter (Signed)
I lm on his phone to plz call back & press 2 to get a person who will schedule his appt to discuss meds with md

## 2010-12-08 NOTE — Telephone Encounter (Signed)
If we are going to put him on a long acting narcotic such as oxycontin we need to discuss this in clinic with a dedicated visit. I am not going to presribe him dilaudid

## 2010-12-12 LAB — T-HELPER CELL (CD4) - (RCID CLINIC ONLY)
CD4 % Helper T Cell: 33
CD4 T Cell Abs: 320 — ABNORMAL LOW

## 2010-12-16 LAB — T-HELPER CELL (CD4) - (RCID CLINIC ONLY)
CD4 % Helper T Cell: 37 % (ref 33–55)
CD4 T Cell Abs: 400 uL (ref 400–2700)
CD4 T Cell Abs: 320 — ABNORMAL LOW

## 2010-12-20 ENCOUNTER — Other Ambulatory Visit: Payer: Self-pay | Admitting: *Deleted

## 2010-12-20 DIAGNOSIS — M792 Neuralgia and neuritis, unspecified: Secondary | ICD-10-CM

## 2010-12-20 MED ORDER — HYDROCODONE-ACETAMINOPHEN 5-500 MG PO TABS: 1.0000 | ORAL_TABLET | Freq: Every day | ORAL | Status: DC

## 2010-12-20 NOTE — Telephone Encounter (Signed)
Patient came to pick up his Rx for this medication.

## 2010-12-21 LAB — T-HELPER CELL (CD4) - (RCID CLINIC ONLY)
CD4 % Helper T Cell: 14 — ABNORMAL LOW
CD4 T Cell Abs: 250 — ABNORMAL LOW

## 2010-12-22 LAB — BUN: BUN: 17

## 2010-12-22 LAB — T-HELPER CELL (CD4) - (RCID CLINIC ONLY): CD4 T Cell Abs: 330 — ABNORMAL LOW

## 2011-01-30 DIAGNOSIS — B20 Human immunodeficiency virus [HIV] disease: Secondary | ICD-10-CM

## 2011-01-31 ENCOUNTER — Other Ambulatory Visit (INDEPENDENT_AMBULATORY_CARE_PROVIDER_SITE_OTHER): Payer: Self-pay

## 2011-01-31 ENCOUNTER — Other Ambulatory Visit: Payer: Self-pay | Admitting: Infectious Disease

## 2011-01-31 DIAGNOSIS — B20 Human immunodeficiency virus [HIV] disease: Secondary | ICD-10-CM

## 2011-01-31 LAB — CBC WITH DIFFERENTIAL/PLATELET
Eosinophils Absolute: 0 10*3/uL (ref 0.0–0.7)
Hemoglobin: 12.1 g/dL — ABNORMAL LOW (ref 13.0–17.0)
Lymphs Abs: 1.6 10*3/uL (ref 0.7–4.0)
MCH: 32.6 pg (ref 26.0–34.0)
MCV: 97.6 fL (ref 78.0–100.0)
Monocytes Relative: 10 % (ref 3–12)
Neutrophils Relative %: 42 % — ABNORMAL LOW (ref 43–77)
RBC: 3.71 MIL/uL — ABNORMAL LOW (ref 4.22–5.81)

## 2011-01-31 LAB — COMPREHENSIVE METABOLIC PANEL
Albumin: 4 g/dL (ref 3.5–5.2)
CO2: 30 mEq/L (ref 19–32)
Calcium: 8.9 mg/dL (ref 8.4–10.5)
Glucose, Bld: 56 mg/dL — ABNORMAL LOW (ref 70–99)
Potassium: 3.8 mEq/L (ref 3.5–5.3)
Sodium: 141 mEq/L (ref 135–145)
Total Bilirubin: 0.5 mg/dL (ref 0.3–1.2)
Total Protein: 7.2 g/dL (ref 6.0–8.3)

## 2011-02-01 LAB — HIV-1 RNA QUANT-NO REFLEX-BLD: HIV 1 RNA Quant: NOT DETECTED copies/mL (ref <20)

## 2011-02-15 ENCOUNTER — Ambulatory Visit (INDEPENDENT_AMBULATORY_CARE_PROVIDER_SITE_OTHER): Payer: Self-pay | Admitting: Infectious Disease

## 2011-02-15 ENCOUNTER — Encounter: Payer: Self-pay | Admitting: Infectious Disease

## 2011-02-15 VITALS — BP 115/69 | HR 87 | Temp 98.0°F | Resp 18 | Ht 74.0 in | Wt 145.2 lb

## 2011-02-15 DIAGNOSIS — R51 Headache: Secondary | ICD-10-CM

## 2011-02-15 DIAGNOSIS — B029 Zoster without complications: Secondary | ICD-10-CM

## 2011-02-15 DIAGNOSIS — F329 Major depressive disorder, single episode, unspecified: Secondary | ICD-10-CM

## 2011-02-15 DIAGNOSIS — IMO0002 Reserved for concepts with insufficient information to code with codable children: Secondary | ICD-10-CM

## 2011-02-15 DIAGNOSIS — R519 Headache, unspecified: Secondary | ICD-10-CM | POA: Insufficient documentation

## 2011-02-15 DIAGNOSIS — B20 Human immunodeficiency virus [HIV] disease: Secondary | ICD-10-CM

## 2011-02-15 DIAGNOSIS — M792 Neuralgia and neuritis, unspecified: Secondary | ICD-10-CM

## 2011-02-15 MED ORDER — HYDROCODONE-ACETAMINOPHEN 5-500 MG PO TABS: 1.0000 | ORAL_TABLET | Freq: Every day | ORAL | Status: DC

## 2011-02-15 NOTE — Assessment & Plan Note (Signed)
This is likely due to a recent uri with post infectious, headache. I gave him additional pain meds for now.

## 2011-02-15 NOTE — Assessment & Plan Note (Signed)
Excellent control.   

## 2011-02-15 NOTE — Assessment & Plan Note (Signed)
Continue cymbalta it is not responsible for his recent symptoms

## 2011-02-15 NOTE — Assessment & Plan Note (Signed)
He has post herpetc neuralgia. COntinue cymbalta and low dose narcotic for breakth through pain

## 2011-02-15 NOTE — Progress Notes (Signed)
  Subjective:    Patient ID: Arthur Lynch, male    DOB: 1960/06/21, 50 y.o.   MRN: [id number]  HPI  Arthur Lynch is a 50 y.o. male who is doing superbly well on their antiviral regimen, with undetectable viral load and health cd4 count.  He is having headaches after recent cervical neck lymph node swelling. Headache frontally sharp at times dull at times currenlty at 8/10. Tylenol PM that he took which helped the pain.  He wonders if this has something to do with the cymbalta he began in august. We reviewed all of his labs together. I spent greater than 45 minutes with the patient including greater than 50% of time in face to face counsel of the patient and in coordination of their care.   Review of Systems  Constitutional: Negative for fever, chills, diaphoresis, activity change, appetite change, fatigue and unexpected weight change.  HENT: Negative for congestion, sore throat, rhinorrhea, sneezing, trouble swallowing and sinus pressure.   Eyes: Negative for photophobia and visual disturbance.  Respiratory: Negative for cough, chest tightness, shortness of breath, wheezing and stridor.   Cardiovascular: Negative for chest pain, palpitations and leg swelling.  Gastrointestinal: Negative for nausea, vomiting, abdominal pain, diarrhea, constipation, blood in stool, abdominal distention and anal bleeding.  Genitourinary: Negative for dysuria, hematuria, flank pain and difficulty urinating.  Musculoskeletal: Negative for myalgias, back pain, joint swelling, arthralgias and gait problem.  Skin: Negative for color change, pallor, rash and wound.  Neurological: Positive for headaches. Negative for dizziness, tremors, weakness and light-headedness.  Hematological: Negative for adenopathy. Does not bruise/bleed easily.  Psychiatric/Behavioral: Negative for behavioral problems, confusion, sleep disturbance, dysphoric mood, decreased concentration and agitation.       Objective:   Physical  Exam  Constitutional: He is oriented to person, place, and time. He appears well-developed and well-nourished. No distress.  HENT:  Head: Normocephalic and atraumatic.  Mouth/Throat: Oropharynx is clear and moist. No oropharyngeal exudate.  Eyes: Conjunctivae and EOM are normal. Pupils are equal, round, and reactive to light. No scleral icterus.  Neck: Normal range of motion. Neck supple. No JVD present.  Cardiovascular: Normal rate, regular rhythm and normal heart sounds.  Exam reveals no gallop and no friction rub.   No murmur heard. Pulmonary/Chest: Effort normal and breath sounds normal. No respiratory distress. He has no wheezes. He has no rales. He exhibits no tenderness.  Abdominal: He exhibits no distension and no mass. There is no tenderness. There is no rebound and no guarding.  Musculoskeletal: He exhibits no edema and no tenderness.  Lymphadenopathy:    He has no cervical adenopathy.  Neurological: He is alert and oriented to person, place, and time. He has normal reflexes. He exhibits normal muscle tone. Coordination normal.  Skin: Skin is warm and dry. He is not diaphoretic. No erythema. No pallor.  Psychiatric: He has a normal mood and affect. His behavior is normal. Judgment and thought content normal.          Assessment & Plan:  HIV DISEASE Excellent control  DISORDER, DEPRESSIVE NEC Continue cymbalta it is not responsible for his recent symptoms  HERPES ZOSTER He has post herpetc neuralgia. COntinue cymbalta and low dose narcotic for breakth through pain  Headache This is likely due to a recent uri with post infectious, headache. I gave him additional pain meds for now.

## 2011-02-22 ENCOUNTER — Ambulatory Visit: Payer: Self-pay | Admitting: Infectious Disease

## 2011-04-06 ENCOUNTER — Other Ambulatory Visit: Payer: Self-pay | Admitting: *Deleted

## 2011-04-06 DIAGNOSIS — M792 Neuralgia and neuritis, unspecified: Secondary | ICD-10-CM

## 2011-04-06 MED ORDER — HYDROCODONE-ACETAMINOPHEN 5-500 MG PO TABS: 1.0000 | ORAL_TABLET | Freq: Every day | ORAL | Status: DC

## 2011-04-06 NOTE — Telephone Encounter (Signed)
C/o dental pain. Has an appt with dentist on 04/24/11. Checked with his md. Ok to refill. Called to the pharmacy

## 2011-04-07 ENCOUNTER — Other Ambulatory Visit: Payer: Self-pay | Admitting: *Deleted

## 2011-04-07 DIAGNOSIS — M792 Neuralgia and neuritis, unspecified: Secondary | ICD-10-CM

## 2011-04-07 MED ORDER — HYDROCODONE-ACETAMINOPHEN 5-500 MG PO TABS: 1.0000 | ORAL_TABLET | Freq: Two times a day (BID) | ORAL | Status: AC | PRN

## 2011-05-26 ENCOUNTER — Other Ambulatory Visit: Payer: Self-pay | Admitting: *Deleted

## 2011-05-26 DIAGNOSIS — B20 Human immunodeficiency virus [HIV] disease: Secondary | ICD-10-CM

## 2011-05-31 ENCOUNTER — Ambulatory Visit (INDEPENDENT_AMBULATORY_CARE_PROVIDER_SITE_OTHER): Payer: Self-pay | Admitting: Infectious Disease

## 2011-05-31 ENCOUNTER — Encounter: Payer: Self-pay | Admitting: Infectious Disease

## 2011-05-31 VITALS — BP 127/87 | HR 70 | Temp 98.0°F | Wt 154.0 lb

## 2011-05-31 DIAGNOSIS — Z113 Encounter for screening for infections with a predominantly sexual mode of transmission: Secondary | ICD-10-CM

## 2011-05-31 DIAGNOSIS — B0239 Other herpes zoster eye disease: Secondary | ICD-10-CM

## 2011-05-31 DIAGNOSIS — B20 Human immunodeficiency virus [HIV] disease: Secondary | ICD-10-CM

## 2011-05-31 DIAGNOSIS — B0230 Zoster ocular disease, unspecified: Secondary | ICD-10-CM | POA: Insufficient documentation

## 2011-05-31 DIAGNOSIS — N62 Hypertrophy of breast: Secondary | ICD-10-CM

## 2011-05-31 LAB — CBC WITH DIFFERENTIAL/PLATELET
Basophils Absolute: 0 10*3/uL (ref 0.0–0.1)
Eosinophils Absolute: 0 10*3/uL (ref 0.0–0.7)
Eosinophils Relative: 0 % (ref 0–5)
HCT: 36.9 % — ABNORMAL LOW (ref 39.0–52.0)
Lymphs Abs: 1.7 10*3/uL (ref 0.7–4.0)
MCH: 31.6 pg (ref 26.0–34.0)
MCV: 99.7 fL (ref 78.0–100.0)
Monocytes Absolute: 0.3 10*3/uL (ref 0.1–1.0)
Platelets: 246 10*3/uL (ref 150–400)
RDW: 12.8 % (ref 11.5–15.5)

## 2011-05-31 MED ORDER — VALACYCLOVIR HCL 1 G PO TABS: 1000.0000 mg | ORAL_TABLET | Freq: Three times a day (TID) | ORAL | Status: DC

## 2011-05-31 NOTE — Assessment & Plan Note (Signed)
Check mammogram left breast

## 2011-05-31 NOTE — Patient Instructions (Signed)
Keep current appt that is on books

## 2011-05-31 NOTE — Assessment & Plan Note (Signed)
Superb control in past. Recheck labs.

## 2011-05-31 NOTE — Assessment & Plan Note (Signed)
Start high dose valtrex 1g tid x 10 days then suppressive valtrex. Will consider giving vaccine as well

## 2011-05-31 NOTE — Progress Notes (Signed)
  Subjective:    Patient ID: Arthur Lynch, male    DOB: 06/13/1960, 51 y.o.   MRN: [id number]  HPI  [name] is a 51 y.o. male who is doing superbly well on their antiviral regimen, with undetectable viral load and health cd4 count. He developed acute onset of vesicular lesions in V2 dermatome this am with pruritis and watery discharge. He was beign seen by dental for teeth cleaning and was worked in to be seen in [place] [facility]. He also has noted worsened now left sided gynecomastia and requested mammogram of that side.    Review of Systems  Constitutional: Negative for fever, chills, diaphoresis, activity change, appetite change, fatigue and unexpected weight change.  HENT: Negative for congestion, sore throat, rhinorrhea, sneezing, trouble swallowing and sinus pressure.   Eyes: Positive for discharge and itching. Negative for photophobia and visual disturbance.  Respiratory: Negative for cough, chest tightness, shortness of breath, wheezing and stridor.   Cardiovascular: Negative for chest pain, palpitations and leg swelling.  Gastrointestinal: Negative for nausea, vomiting, abdominal pain, diarrhea, constipation, blood in stool, abdominal distention and anal bleeding.  Genitourinary: Negative for dysuria, hematuria, flank pain and difficulty urinating.  Musculoskeletal: Negative for myalgias, back pain, joint swelling, arthralgias and gait problem.  Skin: Positive for rash. Negative for color change, pallor and wound.  Neurological: Negative for dizziness, tremors, weakness and light-headedness.  Hematological: Negative for adenopathy. Does not bruise/bleed easily.  Psychiatric/Behavioral: Negative for behavioral problems, confusion, sleep disturbance, dysphoric mood, decreased concentration and agitation.       Objective:   Physical Exam  Constitutional: He is oriented to person, place, and time. He appears well-developed and well-nourished. No distress.  HENT:  Head: Normocephalic  and atraumatic.    Mouth/Throat: Oropharynx is clear and moist. No oropharyngeal exudate.  Eyes: Conjunctivae and EOM are normal. Pupils are equal, round, and reactive to light. No scleral icterus.  Neck: Normal range of motion. Neck supple. No JVD present.  Cardiovascular: Normal rate, regular rhythm and normal heart sounds.  Exam reveals no gallop and no friction rub.   No murmur heard. Pulmonary/Chest: Effort normal and breath sounds normal. No respiratory distress. He has no wheezes. He has no rales. He exhibits no tenderness.       Gynecomastia on left as well as right  Abdominal: He exhibits no distension and no mass. There is no tenderness. There is no rebound and no guarding.  Musculoskeletal: He exhibits no edema and no tenderness.  Lymphadenopathy:    He has no cervical adenopathy.  Neurological: He is alert and oriented to person, place, and time. He has normal reflexes. He exhibits normal muscle tone. Coordination normal.  Skin: Skin is warm and dry. He is not diaphoretic. No erythema. No pallor.  Psychiatric: He has a normal mood and affect. His behavior is normal. Judgment and thought content normal.          Assessment & Plan:  Zoster ophthalmicus Start high dose valtrex 1g tid x 10 days then suppressive valtrex. Will consider giving vaccine as well  HIV DISEASE Superb control in past. Recheck labs.  Gynecomastia Check mammogram left breast

## 2011-06-01 LAB — LIPID PANEL
LDL Cholesterol: 116 mg/dL — ABNORMAL HIGH (ref 0–99)
VLDL: 15 mg/dL (ref 0–40)

## 2011-06-01 LAB — T-HELPER CELL (CD4) - (RCID CLINIC ONLY): CD4 % Helper T Cell: 40 % (ref 33–55)

## 2011-06-01 LAB — COMPLETE METABOLIC PANEL WITH GFR
ALT: 18 U/L (ref 0–53)
AST: 26 U/L (ref 0–37)
Albumin: 4.2 g/dL (ref 3.5–5.2)
Calcium: 9 mg/dL (ref 8.4–10.5)
Chloride: 106 mEq/L (ref 96–112)
Potassium: 4.1 mEq/L (ref 3.5–5.3)
Sodium: 140 mEq/L (ref 135–145)

## 2011-06-02 ENCOUNTER — Ambulatory Visit: Payer: Self-pay

## 2011-06-07 ENCOUNTER — Other Ambulatory Visit: Payer: Self-pay

## 2011-06-21 ENCOUNTER — Encounter: Payer: Self-pay | Admitting: Infectious Disease

## 2011-06-21 ENCOUNTER — Ambulatory Visit (INDEPENDENT_AMBULATORY_CARE_PROVIDER_SITE_OTHER): Payer: Self-pay | Admitting: Infectious Disease

## 2011-06-21 VITALS — BP 119/84 | HR 67 | Temp 97.7°F | Ht 73.0 in | Wt 148.5 lb

## 2011-06-21 DIAGNOSIS — B20 Human immunodeficiency virus [HIV] disease: Secondary | ICD-10-CM

## 2011-06-21 DIAGNOSIS — D649 Anemia, unspecified: Secondary | ICD-10-CM

## 2011-06-21 DIAGNOSIS — B0239 Other herpes zoster eye disease: Secondary | ICD-10-CM

## 2011-06-21 DIAGNOSIS — N62 Hypertrophy of breast: Secondary | ICD-10-CM

## 2011-06-21 DIAGNOSIS — B0230 Zoster ocular disease, unspecified: Secondary | ICD-10-CM

## 2011-06-21 DIAGNOSIS — D539 Nutritional anemia, unspecified: Secondary | ICD-10-CM

## 2011-06-21 LAB — IBC PANEL
%SAT: 32 % (ref 20–55)
TIBC: 286 ug/dL (ref 215–435)

## 2011-06-21 LAB — RETICULOCYTES: RBC.: 3.9 MIL/uL — ABNORMAL LOW (ref 4.22–5.81)

## 2011-06-21 LAB — FOLATE: Folate: 9.4 ng/mL

## 2011-06-21 LAB — VITAMIN B12: Vitamin B-12: 375 pg/mL (ref 211–911)

## 2011-06-21 LAB — FERRITIN: Ferritin: 111 ng/mL (ref 22–322)

## 2011-06-21 NOTE — Assessment & Plan Note (Signed)
Continue isentress and truvada 

## 2011-06-21 NOTE — Assessment & Plan Note (Signed)
finsihed high dose valtrex and onto suppressive valtrex will vaccinate in future

## 2011-06-21 NOTE — Progress Notes (Signed)
  Subjective:    Patient ID: Arthur Lynch, male    DOB: 1960/12/10, 51 y.o.   MRN: [id number]  HPI  51 y.o. male who is doing superbly well on their antiviral regimen, with undetectable viral load and health cd4 count. I saw him at the end of March at which time he had developed acute onset of vesicular lesions in V2 dermatome this am with pruritis and watery discharge. We upped valtrex to 1 g tid.  He was beign seen by dental for teeth cleaning and was worked in to be seen in [place] [employer].    Review of Systems  Constitutional: Negative for fever, chills, diaphoresis, activity change, appetite change, fatigue and unexpected weight change.  HENT: Negative for congestion, sore throat, rhinorrhea, sneezing, trouble swallowing and sinus pressure.   Eyes: Negative for photophobia and visual disturbance.  Respiratory: Negative for cough, chest tightness, shortness of breath, wheezing and stridor.   Cardiovascular: Negative for chest pain, palpitations and leg swelling.  Gastrointestinal: Negative for nausea, vomiting, abdominal pain, diarrhea, constipation, blood in stool, abdominal distention and anal bleeding.  Genitourinary: Negative for dysuria, hematuria, flank pain and difficulty urinating.  Musculoskeletal: Positive for arthralgias. Negative for myalgias, back pain, joint swelling and gait problem.  Skin: Negative for color change, pallor, rash and wound.  Neurological: Negative for dizziness, tremors, weakness and light-headedness.  Hematological: Negative for adenopathy. Does not bruise/bleed easily.  Psychiatric/Behavioral: Negative for behavioral problems, confusion, sleep disturbance, dysphoric mood, decreased concentration and agitation.       Objective:   Physical Exam  Constitutional: He is oriented to person, place, and time. He appears well-developed and well-nourished. No distress.  HENT:  Head: Normocephalic and atraumatic.  Mouth/Throat: Oropharynx is clear and moist. No  oropharyngeal exudate.  Eyes: Conjunctivae and EOM are normal. Pupils are equal, round, and reactive to light. No scleral icterus.  Neck: Normal range of motion. Neck supple. No JVD present.  Cardiovascular: Normal rate, regular rhythm and normal heart sounds.  Exam reveals no gallop and no friction rub.   No murmur heard. Pulmonary/Chest: Effort normal and breath sounds normal. No respiratory distress. He has no wheezes. He has no rales. He exhibits no tenderness.  Abdominal: He exhibits no distension and no mass. There is no tenderness. There is no rebound and no guarding.  Musculoskeletal: He exhibits no edema and no tenderness.  Lymphadenopathy:    He has no cervical adenopathy.  Neurological: He is alert and oriented to person, place, and time. He has normal reflexes. He exhibits normal muscle tone. Coordination normal.  Skin: Skin is warm and dry. He is not diaphoretic. No erythema. No pallor.  Psychiatric: He has a normal mood and affect. His behavior is normal. Judgment and thought content normal.          Assessment & Plan:  HIV DISEASE Continue isentress and truvada  Zoster ophthalmicus finsihed high dose valtrex and onto suppressive valtrex will vaccinate in future  Gynecomastia Fu mammgogram  ANEMIA, MACROCYTIC, CHRONIC Check B12 folate TSH and iron panel.

## 2011-06-21 NOTE — Assessment & Plan Note (Signed)
Fu mammgogram

## 2011-06-21 NOTE — Assessment & Plan Note (Signed)
Check B12 folate TSH and iron panel.

## 2011-10-09 ENCOUNTER — Other Ambulatory Visit: Payer: Self-pay | Admitting: Licensed Clinical Social Worker

## 2011-10-09 DIAGNOSIS — B20 Human immunodeficiency virus [HIV] disease: Secondary | ICD-10-CM

## 2011-10-09 MED ORDER — EMTRICITABINE-TENOFOVIR DF 200-300 MG PO TABS: 1.0000 | ORAL_TABLET | Freq: Every day | ORAL | Status: DC

## 2011-10-09 MED ORDER — RALTEGRAVIR POTASSIUM 400 MG PO TABS: 400.0000 mg | ORAL_TABLET | Freq: Two times a day (BID) | ORAL | Status: DC

## 2011-11-28 ENCOUNTER — Telehealth: Payer: Self-pay | Admitting: Licensed Clinical Social Worker

## 2011-11-28 ENCOUNTER — Encounter: Payer: Self-pay | Admitting: Internal Medicine

## 2011-11-28 ENCOUNTER — Ambulatory Visit (INDEPENDENT_AMBULATORY_CARE_PROVIDER_SITE_OTHER): Payer: Self-pay | Admitting: Internal Medicine

## 2011-11-28 VITALS — BP 143/91 | HR 87 | Temp 98.8°F | Ht 73.0 in | Wt 141.0 lb

## 2011-11-28 DIAGNOSIS — B20 Human immunodeficiency virus [HIV] disease: Secondary | ICD-10-CM

## 2011-11-28 DIAGNOSIS — Z23 Encounter for immunization: Secondary | ICD-10-CM

## 2011-11-28 DIAGNOSIS — B029 Zoster without complications: Secondary | ICD-10-CM

## 2011-11-28 NOTE — Telephone Encounter (Signed)
Patient walked in with right sided facial swelling going into the right eye. Patient noticed his symptoms this morning when he got out of bed. Patients admits to moderate pain.
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
## 2011-12-01 ENCOUNTER — Ambulatory Visit: Payer: Self-pay

## 2011-12-05 ENCOUNTER — Other Ambulatory Visit (INDEPENDENT_AMBULATORY_CARE_PROVIDER_SITE_OTHER): Payer: Self-pay

## 2011-12-05 DIAGNOSIS — B20 Human immunodeficiency virus [HIV] disease: Secondary | ICD-10-CM

## 2011-12-05 LAB — CBC WITH DIFFERENTIAL/PLATELET
Eosinophils Absolute: 0 10*3/uL (ref 0.0–0.7)
Eosinophils Relative: 0 % (ref 0–5)
HCT: 33.5 % — ABNORMAL LOW (ref 39.0–52.0)
Lymphocytes Relative: 44 % (ref 12–46)
Lymphs Abs: 1.5 10*3/uL (ref 0.7–4.0)
MCH: 35 pg — ABNORMAL HIGH (ref 26.0–34.0)
MCV: 99.4 fL (ref 78.0–100.0)
Monocytes Absolute: 0.4 10*3/uL (ref 0.1–1.0)
RBC: 3.37 MIL/uL — ABNORMAL LOW (ref 4.22–5.81)
WBC: 3.4 10*3/uL — ABNORMAL LOW (ref 4.0–10.5)

## 2011-12-05 NOTE — Addendum Note (Signed)
Addended by: Mariea Clonts D on: 12/05/2011 05:15 PM   Modules accepted: Orders

## 2011-12-06 ENCOUNTER — Other Ambulatory Visit: Payer: Self-pay

## 2011-12-06 LAB — COMPLETE METABOLIC PANEL WITH GFR
ALT: 18 U/L (ref 0–53)
AST: 23 U/L (ref 0–37)
Albumin: 3.9 g/dL (ref 3.5–5.2)
Alkaline Phosphatase: 51 U/L (ref 39–117)
BUN: 15 mg/dL (ref 6–23)
Chloride: 105 mEq/L (ref 96–112)
Potassium: 4.3 mEq/L (ref 3.5–5.3)

## 2011-12-07 LAB — HIV-1 RNA QUANT-NO REFLEX-BLD
HIV 1 RNA Quant: <20 copies/mL (ref <20)
HIV-1 RNA Quant, Log: <1.3 {Log} (ref <1.30)

## 2011-12-07 LAB — T-HELPER CELL (CD4) - (RCID CLINIC ONLY): CD4 % Helper T Cell: 37 % (ref 33–55)

## 2011-12-20 ENCOUNTER — Ambulatory Visit (INDEPENDENT_AMBULATORY_CARE_PROVIDER_SITE_OTHER): Payer: Self-pay | Admitting: Infectious Disease

## 2011-12-20 ENCOUNTER — Encounter: Payer: Self-pay | Admitting: Infectious Disease

## 2011-12-20 VITALS — BP 105/74 | HR 83 | Temp 97.8°F | Wt 142.5 lb

## 2011-12-20 DIAGNOSIS — B20 Human immunodeficiency virus [HIV] disease: Secondary | ICD-10-CM

## 2011-12-20 DIAGNOSIS — B0239 Other herpes zoster eye disease: Secondary | ICD-10-CM

## 2011-12-20 DIAGNOSIS — B0230 Zoster ocular disease, unspecified: Secondary | ICD-10-CM

## 2011-12-20 DIAGNOSIS — G4700 Insomnia, unspecified: Secondary | ICD-10-CM | POA: Insufficient documentation

## 2011-12-20 DIAGNOSIS — D539 Nutritional anemia, unspecified: Secondary | ICD-10-CM

## 2011-12-20 DIAGNOSIS — D72819 Decreased white blood cell count, unspecified: Secondary | ICD-10-CM

## 2011-12-20 MED ORDER — ZOLPIDEM TARTRATE 10 MG PO TABS: 10.0000 mg | ORAL_TABLET | Freq: Every evening | ORAL | Status: DC | PRN

## 2011-12-20 NOTE — Assessment & Plan Note (Signed)
Likely related to his HIV when his Cd4 count was low

## 2011-12-20 NOTE — Assessment & Plan Note (Signed)
Try ambien 

## 2011-12-20 NOTE — Assessment & Plan Note (Signed)
Workup has been negative so far. Hgb lower most recently. ? Also related to HIV when CD4 count was low

## 2011-12-20 NOTE — Progress Notes (Signed)
  Subjective:    Patient ID: Arthur Lynch, male    DOB: [birth year], 51 y.o.   MRN: [id number]  HPI  Arthur Lynch is a 51 y.o. male who is doing superbly well on his  antiviral regimen, of isentress and truvada with undetectable viral load and health cd4 count. We discussed option for change to truvada and tivicay but will keep him on current regimen for now.   He had problems related to his facial pain a few weeks ago and saw my partner Dr. Orvan Falconer. I asked pt to ONLY take the valtrex once a day for prophylaxis.  He does have troubles with insomnia at night which responded to xanax friend gave him. Will try ambien.   Review of Systems  Constitutional: Negative for fever, chills, diaphoresis, activity change, appetite change, fatigue and unexpected weight change.  HENT: Negative for congestion, sore throat, rhinorrhea, sneezing, trouble swallowing and sinus pressure.   Eyes: Negative for photophobia and visual disturbance.  Respiratory: Negative for cough, chest tightness, shortness of breath, wheezing and stridor.   Cardiovascular: Negative for chest pain, palpitations and leg swelling.  Gastrointestinal: Negative for nausea, vomiting, abdominal pain, diarrhea, constipation, blood in stool, abdominal distention and anal bleeding.  Genitourinary: Negative for dysuria, hematuria, flank pain and difficulty urinating.  Musculoskeletal: Negative for myalgias, back pain, joint swelling, arthralgias and gait problem.  Skin: Negative for color change, pallor, rash and wound.  Neurological: Negative for dizziness, tremors, weakness and light-headedness.  Hematological: Negative for adenopathy. Does not bruise/bleed easily.  Psychiatric/Behavioral: Negative for behavioral problems, confusion, disturbed wake/sleep cycle, dysphoric mood, decreased concentration and agitation.       Objective:   Physical Exam  Constitutional: He is oriented to person, place, and time. He appears  well-developed and well-nourished. No distress.  HENT:  Head: Normocephalic and atraumatic.  Mouth/Throat: Oropharynx is clear and moist. No oropharyngeal exudate.  Eyes: Conjunctivae normal and EOM are normal. Pupils are equal, round, and reactive to light. No scleral icterus.  Neck: Normal range of motion. Neck supple. No JVD present.  Cardiovascular: Normal rate, regular rhythm and normal heart sounds.  Exam reveals no gallop and no friction rub.   No murmur heard. Pulmonary/Chest: Effort normal and breath sounds normal. No respiratory distress. He has no wheezes. He has no rales. He exhibits no tenderness.  Abdominal: He exhibits no distension and no mass. There is no tenderness. There is no rebound and no guarding.  Musculoskeletal: He exhibits no edema and no tenderness.  Lymphadenopathy:    He has no cervical adenopathy.  Neurological: He is alert and oriented to person, place, and time. He has normal reflexes. He exhibits normal muscle tone. Coordination normal.  Skin: Skin is warm and dry. He is not diaphoretic. No erythema. No pallor.  Psychiatric: He has a normal mood and affect. His behavior is normal. Judgment and thought content normal.          Assessment & Plan:  HIV DISEASE Continue isentress and truvada  Zoster ophthalmicus Continue valtrex 1g daily suppressive rx  LEUKOCYTOPENIA UNSPECIFIED Likely related to his HIV when his Cd4 count was low  ANEMIA, MACROCYTIC, CHRONIC Workup has been negative so far. Hgb lower most recently. ? Also related to HIV when CD4 count was low  Insomnia Try Remus Loffler

## 2011-12-20 NOTE — Assessment & Plan Note (Signed)
Continue isentress and truvada 

## 2011-12-20 NOTE — Assessment & Plan Note (Signed)
Continue valtrex 1g daily suppressive rx

## 2011-12-21 ENCOUNTER — Other Ambulatory Visit: Payer: Self-pay | Admitting: Licensed Clinical Social Worker

## 2011-12-21 DIAGNOSIS — G4700 Insomnia, unspecified: Secondary | ICD-10-CM

## 2011-12-21 MED ORDER — ZOLPIDEM TARTRATE 10 MG PO TABS: 10.0000 mg | ORAL_TABLET | Freq: Every evening | ORAL | Status: DC | PRN

## 2012-01-08 ENCOUNTER — Other Ambulatory Visit: Payer: Self-pay | Admitting: *Deleted

## 2012-01-08 DIAGNOSIS — B20 Human immunodeficiency virus [HIV] disease: Secondary | ICD-10-CM

## 2012-01-08 MED ORDER — EMTRICITABINE-TENOFOVIR DF 200-300 MG PO TABS: 1.0000 | ORAL_TABLET | Freq: Every day | ORAL | Status: DC

## 2012-01-08 MED ORDER — RALTEGRAVIR POTASSIUM 400 MG PO TABS: 400.0000 mg | ORAL_TABLET | Freq: Two times a day (BID) | ORAL | Status: DC

## 2012-01-10 ENCOUNTER — Other Ambulatory Visit: Payer: Self-pay | Admitting: *Deleted

## 2012-01-10 DIAGNOSIS — B20 Human immunodeficiency virus [HIV] disease: Secondary | ICD-10-CM

## 2012-01-10 MED ORDER — EMTRICITABINE-TENOFOVIR DF 200-300 MG PO TABS: 1.0000 | ORAL_TABLET | Freq: Every day | ORAL | Status: DC

## 2012-01-10 MED ORDER — RALTEGRAVIR POTASSIUM 400 MG PO TABS: 400.0000 mg | ORAL_TABLET | Freq: Two times a day (BID) | ORAL | Status: DC

## 2012-04-04 ENCOUNTER — Other Ambulatory Visit: Payer: Self-pay | Admitting: *Deleted

## 2012-04-04 DIAGNOSIS — N62 Hypertrophy of breast: Secondary | ICD-10-CM

## 2012-04-04 DIAGNOSIS — B0230 Zoster ocular disease, unspecified: Secondary | ICD-10-CM

## 2012-04-04 DIAGNOSIS — B20 Human immunodeficiency virus [HIV] disease: Secondary | ICD-10-CM

## 2012-04-04 MED ORDER — VALACYCLOVIR HCL 1 G PO TABS: 1000.0000 mg | ORAL_TABLET | Freq: Every day | ORAL | Status: DC

## 2012-04-05 ENCOUNTER — Encounter (HOSPITAL_COMMUNITY): Payer: Self-pay | Admitting: *Deleted

## 2012-04-05 ENCOUNTER — Emergency Department (HOSPITAL_COMMUNITY): Payer: Self-pay

## 2012-04-05 ENCOUNTER — Emergency Department (HOSPITAL_COMMUNITY)
Admission: EM | Admit: 2012-04-05 | Discharge: 2012-04-05 | Disposition: A | Discharge status: Home / Self Care | Payer: Self-pay | Attending: Emergency Medicine | Admitting: Emergency Medicine

## 2012-04-05 DIAGNOSIS — Z79899 Other long term (current) drug therapy: Secondary | ICD-10-CM | POA: Insufficient documentation

## 2012-04-05 DIAGNOSIS — K5900 Constipation, unspecified: Secondary | ICD-10-CM | POA: Insufficient documentation

## 2012-04-05 DIAGNOSIS — R300 Dysuria: Secondary | ICD-10-CM | POA: Insufficient documentation

## 2012-04-05 DIAGNOSIS — Z21 Asymptomatic human immunodeficiency virus [HIV] infection status: Secondary | ICD-10-CM | POA: Insufficient documentation

## 2012-04-05 DIAGNOSIS — R011 Cardiac murmur, unspecified: Secondary | ICD-10-CM | POA: Insufficient documentation

## 2012-04-05 HISTORY — DX: Asymptomatic human immunodeficiency virus (hiv) infection status: Z21

## 2012-04-05 HISTORY — DX: Human immunodeficiency virus (HIV) disease: B20

## 2012-04-05 HISTORY — DX: Cardiac murmur, unspecified: R01.1

## 2012-04-05 LAB — COMPREHENSIVE METABOLIC PANEL WITH GFR
ALT: 13 U/L (ref 0–53)
AST: 28 U/L (ref 0–37)
Albumin: 3.9 g/dL (ref 3.5–5.2)
Alkaline Phosphatase: 67 U/L (ref 39–117)
BUN: 17 mg/dL (ref 6–23)
CO2: 24 meq/L (ref 19–32)
Calcium: 9.1 mg/dL (ref 8.4–10.5)
Chloride: 105 meq/L (ref 96–112)
Creatinine, Ser: 1.36 mg/dL — ABNORMAL HIGH (ref 0.50–1.35)
GFR calc Af Amer: 69 mL/min — ABNORMAL LOW
GFR calc non Af Amer: 60 mL/min — ABNORMAL LOW
Glucose, Bld: 114 mg/dL — ABNORMAL HIGH (ref 70–99)
Potassium: 3.9 meq/L (ref 3.5–5.1)
Sodium: 142 meq/L (ref 135–145)
Total Bilirubin: 0.2 mg/dL — ABNORMAL LOW (ref 0.3–1.2)
Total Protein: 8.1 g/dL (ref 6.0–8.3)

## 2012-04-05 LAB — CBC WITH DIFFERENTIAL/PLATELET
Basophils Absolute: 0 K/uL (ref 0.0–0.1)
Basophils Relative: 0 % (ref 0–1)
Eosinophils Absolute: 0 K/uL (ref 0.0–0.7)
Eosinophils Relative: 0 % (ref 0–5)
HCT: 40.9 % (ref 39.0–52.0)
Hemoglobin: 14.6 g/dL (ref 13.0–17.0)
Lymphocytes Relative: 47 % — ABNORMAL HIGH (ref 12–46)
Lymphs Abs: 2.4 K/uL (ref 0.7–4.0)
MCH: 36.5 pg — ABNORMAL HIGH (ref 26.0–34.0)
MCHC: 35.7 g/dL (ref 30.0–36.0)
MCV: 102.3 fL — ABNORMAL HIGH (ref 78.0–100.0)
Monocytes Absolute: 0.3 K/uL (ref 0.1–1.0)
Monocytes Relative: 6 % (ref 3–12)
Neutro Abs: 2.4 K/uL (ref 1.7–7.7)
Neutrophils Relative %: 47 % (ref 43–77)
Platelets: 265 K/uL (ref 150–400)
RBC: 4 MIL/uL — ABNORMAL LOW (ref 4.22–5.81)
RDW: 12.3 % (ref 11.5–15.5)
WBC: 5.2 K/uL (ref 4.0–10.5)

## 2012-04-05 LAB — URINALYSIS, ROUTINE W REFLEX MICROSCOPIC
Bilirubin Urine: NEGATIVE
Hgb urine dipstick: NEGATIVE
Nitrite: NEGATIVE
Specific Gravity, Urine: 1.027 (ref 1.005–1.030)
pH: 6 (ref 5.0–8.0)

## 2012-04-05 LAB — OCCULT BLOOD X 1 CARD TO LAB, STOOL: Fecal Occult Bld: NEGATIVE

## 2012-04-05 MED ORDER — POLYETHYLENE GLYCOL 3350 17 GM/SCOOP PO POWD: 17.0000 g | Freq: Two times a day (BID) | ORAL | Status: DC

## 2012-04-05 NOTE — ED Notes (Signed)
Pt unable to give urine sample in triage  

## 2012-04-05 NOTE — ED Notes (Signed)
Bladder scanner done 95ml noted

## 2012-04-05 NOTE — ED Notes (Signed)
The pt has not had a bm for one week.  This is not normal for him

## 2012-04-05 NOTE — ED Notes (Signed)
Patient states for the past 2 weeks he has been constipated and had difficulty urination.  States he feels like he has to urinate but nothing comes out.  States the last normal BM was 2 weeks ago.  Has been drinking water and OJ daily.

## 2012-04-05 NOTE — ED Provider Notes (Signed)
History     CSN: [id number]  Arrival date & time 04/05/12  1749   First MD Initiated Contact with Patient 04/05/12 1859      Chief Complaint  Patient presents with  . constipation     (Consider location/radiation/quality/duration/timing/severity/associated sxs/prior treatment) HPI Comments: This is a 52 year old male, who presents emergency department with chief complaint of constipation. Patient states for the past week he has not had a regular bowel movement. He states that he still has a feeling of fullness, however he is able to have a small amounts of loose stool past periodically. He denies any blood in his stool. Denies fever, nausea, and vomiting. Additionally he states that it is becoming hard for him to urinate. He has tried using saline enemas with some relief. States that his level of discomfort currently is mild.  The history is provided by the patient. No language interpreter was used.    Past Medical History  Diagnosis Date  . Murmur, heart   . HIV (human immunodeficiency virus infection)     No past surgical history on file.  No family history on file.  History  Substance Use Topics  . Smoking status: Never Smoker   . Smokeless tobacco: Never Used  . Alcohol Use: No      Review of Systems  All other systems reviewed and are negative.    Allergies  Penicillins  Home Medications   Current Outpatient Rx  Name  Route  Sig  Dispense  Refill  . EMTRICITABINE-TENOFOVIR 200-300 MG PO TABS   Oral   Take 1 tablet by mouth daily.   30 tablet   11   . HYDROCODONE-ACETAMINOPHEN 5-500 MG PO TABS   Oral   Take 1 tablet by mouth 2 (two) times daily as needed for pain.   60 tablet   0   . RALTEGRAVIR POTASSIUM 400 MG PO TABS   Oral   Take 1 tablet (400 mg total) by mouth 2 (two) times daily.   60 tablet   11   . VALACYCLOVIR HCL 1 G PO TABS   Oral   Take 1 tablet (1,000 mg total) by mouth daily.   30 tablet   5   . ZOLPIDEM TARTRATE 10 MG PO  TABS   Oral   Take 10 mg by mouth at bedtime as needed. For insomnia         . POLYETHYLENE GLYCOL 3350 PO POWD   Oral   Take 17 g by mouth 2 (two) times daily. Until daily soft stools  OTC   255 g   0     BP 105/73  Pulse 83  Temp 98 F (36.7 C) (Oral)  Resp 18  SpO2 97%  Physical Exam  Nursing note and vitals reviewed. Constitutional: He is oriented to person, place, and time. He appears well-developed and well-nourished.  HENT:  Head: Normocephalic and atraumatic.  Right Ear: External ear normal.  Left Ear: External ear normal.  Nose: Nose normal.  Mouth/Throat: Oropharynx is clear and moist. No oropharyngeal exudate.  Eyes: Conjunctivae normal and EOM are normal. Pupils are equal, round, and reactive to light. Right eye exhibits no discharge. Left eye exhibits no discharge. No scleral icterus.  Neck: Normal range of motion. Neck supple. No JVD present.  Cardiovascular: Normal rate, regular rhythm, normal heart sounds and intact distal pulses.  Exam reveals no gallop and no friction rub.   No murmur heard. Pulmonary/Chest: Effort normal and breath sounds normal. No respiratory distress.  He has no wheezes. He has no rales. He exhibits no tenderness.  Abdominal: Soft. Bowel sounds are normal. He exhibits no distension and no mass. There is no tenderness. There is no rebound and no guarding.  Genitourinary: Rectum normal.       Good rectal tone, soft brown stool in the rectal vault, no signs gross blood, or internal or external hemorrhoids.  Musculoskeletal: Normal range of motion. He exhibits no edema and no tenderness.  Neurological: He is alert and oriented to person, place, and time. He has normal reflexes.  Skin: Skin is warm and dry.  Psychiatric: He has a normal mood and affect. His behavior is normal. Judgment and thought content normal.    ED Course  Procedures (including critical care time)  Labs Reviewed  CBC WITH DIFFERENTIAL - Abnormal; Notable for the  following:    RBC 4.00 (*)     MCV 102.3 (*)     MCH 36.5 (*)     Lymphocytes Relative 47 (*)     All other components within normal limits  COMPREHENSIVE METABOLIC PANEL - Abnormal; Notable for the following:    Glucose, Bld 114 (*)     Creatinine, Ser 1.36 (*)     Total Bilirubin 0.2 (*)     GFR calc non Af Amer 60 (*)     GFR calc Af Amer 69 (*)     All other components within normal limits  URINALYSIS, ROUTINE W REFLEX MICROSCOPIC  OCCULT BLOOD X 1 CARD TO LAB, STOOL   Dg Abd Acute W/chest  04/05/2012  *RADIOLOGY REPORT*  Clinical Data: No bm for 7 days. Difficulty urinating. Chest pain. Nausea. Nonsmoker. 3 images.  ACUTE ABDOMEN SERIES (ABDOMEN 2 VIEW & CHEST 1 VIEW)  Comparison: 04/16/2008  Findings: Cardiac and mediastinal contours appear normal.  The lungs appear clear.  No pleural effusion is identified.  The amount of stool in the colon is within normal limits.  No dilated bowel or significant abnormal air-fluid levels.  Vascular calcifications project over the lower anatomic pelvis.  IMPRESSION:  1.  Unremarkable bowel gas pattern.   Original Report Authenticated By: Gaylyn Rong, M.D.    Results for orders placed during the hospital encounter of 04/05/12  URINALYSIS, ROUTINE W REFLEX MICROSCOPIC      Component Value Range   Color, Urine YELLOW  YELLOW   APPearance CLEAR  CLEAR   Specific Gravity, Urine 1.027  1.005 - 1.030   pH 6.0  5.0 - 8.0   Glucose, UA NEGATIVE  NEGATIVE mg/dL   Hgb urine dipstick NEGATIVE  NEGATIVE   Bilirubin Urine NEGATIVE  NEGATIVE   Ketones, ur NEGATIVE  NEGATIVE mg/dL   Protein, ur NEGATIVE  NEGATIVE mg/dL   Urobilinogen, UA 1.0  0.0 - 1.0 mg/dL   Nitrite NEGATIVE  NEGATIVE   Leukocytes, UA NEGATIVE  NEGATIVE  CBC WITH DIFFERENTIAL      Component Value Range   WBC 5.2  4.0 - 10.5 K/uL   RBC 4.00 (*) 4.22 - 5.81 MIL/uL   Hemoglobin 14.6  13.0 - 17.0 g/dL   HCT 16.1  09.6 - 04.5 %   MCV 102.3 (*) 78.0 - 100.0 fL   MCH 36.5 (*) 26.0 -  34.0 pg   MCHC 35.7  30.0 - 36.0 g/dL   RDW 40.9  81.1 - 91.4 %   Platelets 265  150 - 400 K/uL   Neutrophils Relative 47  43 - 77 %   Neutro Abs 2.4  1.7 - 7.7 K/uL   Lymphocytes Relative 47 (*) 12 - 46 %   Lymphs Abs 2.4  0.7 - 4.0 K/uL   Monocytes Relative 6  3 - 12 %   Monocytes Absolute 0.3  0.1 - 1.0 K/uL   Eosinophils Relative 0  0 - 5 %   Eosinophils Absolute 0.0  0.0 - 0.7 K/uL   Basophils Relative 0  0 - 1 %   Basophils Absolute 0.0  0.0 - 0.1 K/uL  COMPREHENSIVE METABOLIC PANEL      Component Value Range   Sodium 142  135 - 145 mEq/L   Potassium 3.9  3.5 - 5.1 mEq/L   Chloride 105  96 - 112 mEq/L   CO2 24  19 - 32 mEq/L   Glucose, Bld 114 (*) 70 - 99 mg/dL   BUN 17  6 - 23 mg/dL   Creatinine, Ser 1.61 (*) 0.50 - 1.35 mg/dL   Calcium 9.1  8.4 - 09.6 mg/dL   Total Protein 8.1  6.0 - 8.3 g/dL   Albumin 3.9  3.5 - 5.2 g/dL   AST 28  0 - 37 U/L   ALT 13  0 - 53 U/L   Alkaline Phosphatase 67  39 - 117 U/L   Total Bilirubin 0.2 (*) 0.3 - 1.2 mg/dL   GFR calc non Af Amer 60 (*) >90 mL/min   GFR calc Af Amer 69 (*) >90 mL/min  OCCULT BLOOD X 1 CARD TO LAB, STOOL      Component Value Range   Fecal Occult Bld NEGATIVE  NEGATIVE   Dg Abd Acute W/chest  04/05/2012  *RADIOLOGY REPORT*  Clinical Data: No bm for 7 days. Difficulty urinating. Chest pain. Nausea. Nonsmoker. 3 images.  ACUTE ABDOMEN SERIES (ABDOMEN 2 VIEW & CHEST 1 VIEW)  Comparison: 04/16/2008  Findings: Cardiac and mediastinal contours appear normal.  The lungs appear clear.  No pleural effusion is identified.  The amount of stool in the colon is within normal limits.  No dilated bowel or significant abnormal air-fluid levels.  Vascular calcifications project over the lower anatomic pelvis.  IMPRESSION:  1.  Unremarkable bowel gas pattern.   Original Report Authenticated By: Gaylyn Rong, M.D.       1. Constipation       MDM  52 year old male with constipation. Labs and films have been unremarkable.  Patient is still able to have bowel movements, though he says that they're not as regular as normal. There is no blood per rectum. I'm going to discharge him with MiraLax and primary care followup. Patient understands and agrees with the plan. He is stable and ready for discharge.        Roxy Horseman, PA-C 04/05/12 2130

## 2012-04-06 NOTE — ED Provider Notes (Signed)
Medical screening examination/treatment/procedure(s) were performed by non-physician practitioner and as supervising physician I was immediately available for consultation/collaboration.  Brieonna Crutcher, MD 04/06/12 0037 

## 2012-06-05 ENCOUNTER — Other Ambulatory Visit: Payer: Self-pay

## 2012-06-19 ENCOUNTER — Ambulatory Visit: Payer: Self-pay | Admitting: Infectious Disease

## 2012-07-02 ENCOUNTER — Encounter: Payer: Self-pay | Admitting: Internal Medicine

## 2012-07-02 ENCOUNTER — Ambulatory Visit (INDEPENDENT_AMBULATORY_CARE_PROVIDER_SITE_OTHER): Payer: PRIVATE HEALTH INSURANCE | Admitting: Internal Medicine

## 2012-07-02 ENCOUNTER — Ambulatory Visit: Payer: Self-pay

## 2012-07-02 VITALS — BP 105/72 | HR 94 | Temp 98.1°F | Wt 143.0 lb

## 2012-07-02 DIAGNOSIS — B20 Human immunodeficiency virus [HIV] disease: Secondary | ICD-10-CM

## 2012-07-02 MED ORDER — RALTEGRAVIR POTASSIUM 400 MG PO TABS: 400.0000 mg | ORAL_TABLET | Freq: Two times a day (BID) | ORAL | Status: DC

## 2012-07-02 MED ORDER — EMTRICITABINE-TENOFOVIR DF 200-300 MG PO TABS: 1.0000 | ORAL_TABLET | Freq: Every day | ORAL | Status: DC
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
## 2012-07-08 ENCOUNTER — Other Ambulatory Visit: Payer: PRIVATE HEALTH INSURANCE

## 2012-07-08 ENCOUNTER — Ambulatory Visit: Payer: Self-pay

## 2012-07-16 ENCOUNTER — Telehealth: Payer: Self-pay | Admitting: Internal Medicine

## 2012-07-16 NOTE — Telephone Encounter (Signed)
Faxed and mailed applications to [employer] and [employer] today.

## 2012-07-18 ENCOUNTER — Telehealth: Payer: Self-pay | Admitting: *Deleted

## 2012-07-18 ENCOUNTER — Telehealth: Payer: Self-pay | Admitting: Internal Medicine

## 2012-07-18 NOTE — Telephone Encounter (Signed)
Quite reasonable

## 2012-07-18 NOTE — Telephone Encounter (Signed)
Pt requesting medication to be called in to pharmacy.  RN checked previous medications for these symptoms.  Not present on Medication Profile.  RN checked available "work-in" appts.  None available.  Pt already has appt w/ Dr Daiva Eves for Monday, May 12 @ 9:45.  RN returned pt's call and left a message.  Advised pt to try generic Claritin, ibuprofen and decongestant for his symptoms.  Advised if he starts having a fever to go to Urgent Care for evaluation.  Pt needs to keep appt w/ Dr. Daiva Eves.  Did not keep lab work appt.

## 2012-07-18 NOTE — Telephone Encounter (Signed)
Received message to call [employer] concerning Mr. Olver application.  Returned call.  Patient had already called and given them the information needed for the exception review.

## 2012-07-22 ENCOUNTER — Encounter: Payer: Self-pay | Admitting: Infectious Disease

## 2012-07-22 ENCOUNTER — Ambulatory Visit (INDEPENDENT_AMBULATORY_CARE_PROVIDER_SITE_OTHER): Payer: PRIVATE HEALTH INSURANCE | Admitting: Infectious Disease

## 2012-07-22 ENCOUNTER — Telehealth: Payer: Self-pay | Admitting: Infectious Disease

## 2012-07-22 VITALS — BP 102/66 | HR 81 | Temp 98.0°F | Ht 73.0 in | Wt 144.0 lb

## 2012-07-22 DIAGNOSIS — A630 Anogenital (venereal) warts: Secondary | ICD-10-CM

## 2012-07-22 DIAGNOSIS — B20 Human immunodeficiency virus [HIV] disease: Secondary | ICD-10-CM

## 2012-07-22 DIAGNOSIS — Z598 Other problems related to housing and economic circumstances: Secondary | ICD-10-CM

## 2012-07-22 NOTE — Telephone Encounter (Signed)
Received fax from [employer].  Arthur Lynch has been approved for the grant to pay his deductable.  I called and left him a V/M telling him he has been approved.

## 2012-07-22 NOTE — Progress Notes (Signed)
  Subjective:    Patient ID: Arthur Lynch, male    DOB: August 24, 1960, 52 y.o.   MRN: [id number]  HPI  Arthur Lynch is a 52 y.o. male who HAD been doing superbly well on his  antiviral regimen, of isentress and truvada with undetectable viral load and health cd4 count.  He now has insurance via his employer with $3000 deductiable which he cannot afford and he has been unable to afford meds. We worked with Elita Quick to get him assistance from an organiziation that will pay up to $4k per year assistance and cover his meds.  He also c/o anal warts and not having had his pap smear ==which I performed udring this visit.  My pharmacy resident and I spent greater than 45 minutes with the patient including greater than 50% of time in face to face counsel of the patient and in coordination of their care.   Review of Systems  Constitutional: Negative for fever, chills, diaphoresis, activity change, appetite change, fatigue and unexpected weight change.  HENT: Negative for congestion, sore throat, rhinorrhea, sneezing, trouble swallowing and sinus pressure.   Eyes: Negative for photophobia and visual disturbance.  Respiratory: Negative for cough, chest tightness, shortness of breath, wheezing and stridor.   Cardiovascular: Negative for chest pain, palpitations and leg swelling.  Gastrointestinal: Negative for nausea, vomiting, abdominal pain, diarrhea, constipation, blood in stool, abdominal distention and anal bleeding.  Genitourinary: Negative for dysuria, hematuria, flank pain and difficulty urinating.  Musculoskeletal: Negative for myalgias, back pain, joint swelling, arthralgias and gait problem.  Skin: Negative for color change, pallor, rash and wound.  Neurological: Negative for dizziness, tremors, weakness and light-headedness.  Hematological: Negative for adenopathy. Does not bruise/bleed easily.  Psychiatric/Behavioral: Negative for behavioral problems, confusion, sleep disturbance, dysphoric  mood, decreased concentration and agitation.       Objective:   Physical Exam  Constitutional: He is oriented to person, place, and time. He appears well-developed and well-nourished. No distress.  HENT:  Head: Normocephalic and atraumatic.  Mouth/Throat: Oropharynx is clear and moist. No oropharyngeal exudate.  Eyes: Conjunctivae and EOM are normal. Pupils are equal, round, and reactive to light. No scleral icterus.  Neck: Normal range of motion. Neck supple. No JVD present.  Cardiovascular: Normal rate, regular rhythm and normal heart sounds.  Exam reveals no gallop and no friction rub.   No murmur heard. Pulmonary/Chest: Effort normal and breath sounds normal. No respiratory distress. He has no wheezes. He has no rales. He exhibits no tenderness.  Abdominal: He exhibits no distension and no mass. There is no tenderness. There is no rebound and no guarding.  Genitourinary:     Musculoskeletal: He exhibits no edema and no tenderness.  Lymphadenopathy:    He has no cervical adenopathy.  Neurological: He is alert and oriented to person, place, and time. He has normal reflexes. He exhibits normal muscle tone. Coordination normal.  Skin: Skin is warm and dry. He is not diaphoretic. No erythema. No pallor.  Psychiatric: He has a normal mood and affect. His behavior is normal. Judgment and thought content normal.          Assessment & Plan:  HIV: pt approved by assistance plan. Restart isentress and truvada, recheck labs one month on meds  Anal warts: took pap smear and will make referral to Gen Surgery with [facility]

## 2012-07-22 NOTE — Progress Notes (Addendum)
HPI: Arthur Lynch is a 52 y.o. male who was well controlled on Isentress + Truvada, but had been having difficulties getting his medications covered after starting his new insurance and has been off therapy.  Allergies: Allergies  Allergen Reactions  . Penicillins Itching    Vitals: Temp: 98 F (36.7 C) (05/12 0928) Temp src: Oral (05/12 0928) BP: 102/66 mmHg (05/12 0928) Pulse Rate: 81 (05/12 1610)  Past Medical History: Past Medical History  Diagnosis Date  . Murmur, heart   . HIV (human immunodeficiency virus infection)     Social History: History   Social History  . Marital Status: Single    Spouse Name: N/A    Number of Children: N/A  . Years of Education: N/A   Social History Main Topics  . Smoking status: Never Smoker   . Smokeless tobacco: Never Used  . Alcohol Use: No  . Drug Use: No  . Sexually Active: No     Comment: declined condoms   Other Topics Concern  . None   Social History Narrative  . None    Current Regimen: Raltegravir + Truvada  Labs: HIV 1 RNA Quant (copies/mL)  Date Value  12/05/2011 <20   05/31/2011 <20   01/31/2011 NOT DETECTED      CD4 T Cell Abs (cmm)  Date Value  12/05/2011 500   05/31/2011 760   01/31/2011 590      Hep B S Ab (no units)  Date Value  05/07/2006 NO      Hepatitis B Surface Ag (no units)  Date Value  05/07/2006 NO      HCV Ab (no units)  Date Value  05/07/2006 NO     CrCl: Estimated Creatinine Clearance: 60.7 ml/min (by C-G formula based on Cr of 1.36).  Lipids:    Component Value Date/Time   CHOL 182 05/31/2011 1617   TRIG 76 05/31/2011 1617   HDL 51 05/31/2011 1617   CHOLHDL 3.6 05/31/2011 1617   VLDL 15 05/31/2011 1617   LDLCALC 116* 05/31/2011 1617    Assessment: Patient has no barriers to adherence other than financial difficulties with his new insurance. We discussed [employer] since that was originally being considered due to easier company assistance, but a solution was found to be  able to continue his current regimen with patient assistance to cover his deductible. He also had multiple questions about OTC products that I helped clarify with him.  Recommendations: - Restart raltegravir + Truvada  Drue Stager, PharmD [facility] 07/22/2012, 12:27 PM

## 2012-07-24 ENCOUNTER — Telehealth: Payer: Self-pay | Admitting: Infectious Disease

## 2012-07-24 NOTE — Telephone Encounter (Signed)
Patient had ASCUS on pap smear  He needs to have this better evaluated.   I had put in referral to [facility] but he also may want to be seen by Dr. Ninetta Lights if he is doing HRA

## 2012-07-24 NOTE — Telephone Encounter (Signed)
oK lets set him up with Dr. Ninetta Lights for HRA

## 2012-07-24 NOTE — Telephone Encounter (Signed)
Patient prefers to have it done here in the clinic Arthur Lynch

## 2012-08-22 ENCOUNTER — Telehealth: Payer: Self-pay | Admitting: *Deleted

## 2012-08-22 NOTE — Telephone Encounter (Signed)
Called Arthur Lynch and left a v/m asking if he has signed the Attestation form and mailed it back to [employer].  I still have mine if he has not sent his in.

## 2012-08-26 ENCOUNTER — Other Ambulatory Visit (INDEPENDENT_AMBULATORY_CARE_PROVIDER_SITE_OTHER): Payer: PRIVATE HEALTH INSURANCE

## 2012-08-26 DIAGNOSIS — B20 Human immunodeficiency virus [HIV] disease: Secondary | ICD-10-CM

## 2012-08-26 LAB — COMPLETE METABOLIC PANEL WITH GFR
Alkaline Phosphatase: 56 U/L (ref 39–117)
BUN: 18 mg/dL (ref 6–23)
CO2: 29 mEq/L (ref 19–32)
Creat: 1.17 mg/dL (ref 0.50–1.35)
GFR, Est African American: 84 mL/min
GFR, Est Non African American: 73 mL/min
Glucose, Bld: 67 mg/dL — ABNORMAL LOW (ref 70–99)
Sodium: 139 mEq/L (ref 135–145)
Total Bilirubin: 0.7 mg/dL (ref 0.3–1.2)
Total Protein: 7.4 g/dL (ref 6.0–8.3)

## 2012-08-26 LAB — LIPID PANEL
Cholesterol: 159 mg/dL (ref 0–200)
HDL: 72 mg/dL (ref >39)
Total CHOL/HDL Ratio: 2.2 Ratio

## 2012-08-27 LAB — CBC WITH DIFFERENTIAL/PLATELET
Basophils Absolute: 0 10*3/uL (ref 0.0–0.1)
Eosinophils Absolute: 0 10*3/uL (ref 0.0–0.7)
Eosinophils Relative: 0 % (ref 0–5)
HCT: 37.7 % — ABNORMAL LOW (ref 39.0–52.0)
Lymphocytes Relative: 55 % — ABNORMAL HIGH (ref 12–46)
Lymphs Abs: 1.6 10*3/uL (ref 0.7–4.0)
MCH: 33.2 pg (ref 26.0–34.0)
MCV: 98.7 fL (ref 78.0–100.0)
Monocytes Absolute: 0.3 10*3/uL (ref 0.1–1.0)
Platelets: 246 10*3/uL (ref 150–400)
RDW: 13.8 % (ref 11.5–15.5)
WBC: 3 10*3/uL — ABNORMAL LOW (ref 4.0–10.5)

## 2012-09-09 ENCOUNTER — Encounter: Payer: Self-pay | Admitting: Infectious Disease

## 2012-09-09 ENCOUNTER — Ambulatory Visit (INDEPENDENT_AMBULATORY_CARE_PROVIDER_SITE_OTHER): Payer: PRIVATE HEALTH INSURANCE | Admitting: Infectious Disease

## 2012-09-09 VITALS — BP 109/72 | HR 83 | Temp 98.1°F | Wt 138.0 lb

## 2012-09-09 DIAGNOSIS — A630 Anogenital (venereal) warts: Secondary | ICD-10-CM

## 2012-09-09 DIAGNOSIS — B20 Human immunodeficiency virus [HIV] disease: Secondary | ICD-10-CM

## 2012-09-09 DIAGNOSIS — B0229 Other postherpetic nervous system involvement: Secondary | ICD-10-CM

## 2012-09-09 NOTE — Progress Notes (Signed)
  Subjective:    Patient ID: Arthur Lynch, male    DOB: [birth year], 52 y.o.   MRN: [id number]  HPI   Arthur Lynch is a 52 y.o. male who has  been doing superbly well on his  antiviral regimen, of isentress and truvada with undetectable viral load and health cd4 count.  He has has problems with anogenital warts and has an abnormal anal pap smear.   He has continued to suffer from heat intolerance due to his chronic post herpetic neuralgic pain in his face. He feels that this was better controlled by a medicine in the past I believe it was possiby a tricyclic antidepressant.   Review of Systems  Constitutional: Negative for fever, chills, diaphoresis, activity change, appetite change, fatigue and unexpected weight change.  HENT: Negative for congestion, sore throat, rhinorrhea, sneezing, trouble swallowing and sinus pressure.   Eyes: Negative for photophobia and visual disturbance.  Respiratory: Negative for cough, chest tightness, shortness of breath, wheezing and stridor.   Cardiovascular: Negative for chest pain, palpitations and leg swelling.  Gastrointestinal: Negative for nausea, vomiting, abdominal pain, diarrhea, constipation, blood in stool, abdominal distention and anal bleeding.  Genitourinary: Negative for dysuria, hematuria, flank pain and difficulty urinating.  Musculoskeletal: Negative for myalgias, back pain, joint swelling, arthralgias and gait problem.  Skin: Negative for color change, pallor, rash and wound.  Neurological: Negative for dizziness, tremors, weakness and light-headedness.  Hematological: Negative for adenopathy. Does not bruise/bleed easily.  Psychiatric/Behavioral: Negative for behavioral problems, confusion, sleep disturbance, dysphoric mood, decreased concentration and agitation.       Objective:   Physical Exam  Constitutional: He is oriented to person, place, and time. He appears well-developed and well-nourished. No distress.  HENT:  Head:  Normocephalic and atraumatic.  Mouth/Throat: Oropharynx is clear and moist. No oropharyngeal exudate.  Eyes: Conjunctivae and EOM are normal. Pupils are equal, round, and reactive to light. No scleral icterus.  Neck: Normal range of motion. Neck supple. No JVD present.  Cardiovascular: Normal rate, regular rhythm and normal heart sounds.  Exam reveals no gallop and no friction rub.   No murmur heard. Pulmonary/Chest: Effort normal and breath sounds normal. No respiratory distress. He has no wheezes. He has no rales. He exhibits no tenderness.  Abdominal: He exhibits no distension and no mass. There is no tenderness. There is no rebound and no guarding.  Genitourinary:     Musculoskeletal: He exhibits no edema and no tenderness.  Lymphadenopathy:    He has no cervical adenopathy.  Neurological: He is alert and oriented to person, place, and time. He has normal reflexes. He exhibits normal muscle tone. Coordination normal.  Skin: Skin is warm and dry. He is not diaphoretic. No erythema. No pallor.  Psychiatric: He has a normal mood and affect. His behavior is normal. Judgment and thought content normal.       Assessment & Plan:  HIV: continue  isentress and truvada  Anal warts: will make referral to Gen Surgery with [facility]  Post-herpetic pain: see what tricyclic antidepressant might have ameliorated this

## 2012-10-11 ENCOUNTER — Telehealth: Payer: Self-pay

## 2012-10-11 NOTE — Telephone Encounter (Signed)
Called patient's insurance and the codes are all billable, but they could not tell me how much it would cover - I left patient 3 messages about this and bringing RW paperwork, just in case.

## 2012-10-15 ENCOUNTER — Ambulatory Visit (INDEPENDENT_AMBULATORY_CARE_PROVIDER_SITE_OTHER): Payer: PRIVATE HEALTH INSURANCE | Admitting: Infectious Diseases

## 2012-10-15 ENCOUNTER — Encounter: Payer: Self-pay | Admitting: Infectious Diseases

## 2012-10-15 ENCOUNTER — Ambulatory Visit: Payer: PRIVATE HEALTH INSURANCE

## 2012-10-15 ENCOUNTER — Other Ambulatory Visit (HOSPITAL_COMMUNITY)
Admission: RE | Admit: 2012-10-15 | Discharge: 2012-10-15 | Disposition: A | Discharge status: Home / Self Care | Payer: PRIVATE HEALTH INSURANCE | Source: Ambulatory Visit | Attending: Infectious Diseases | Admitting: Infectious Diseases

## 2012-10-15 VITALS — BP 126/87 | HR 70 | Temp 98.3°F | Ht 73.5 in | Wt 141.0 lb

## 2012-10-15 DIAGNOSIS — K6282 Dysplasia of anus: Secondary | ICD-10-CM

## 2012-10-15 DIAGNOSIS — R85618 Other abnormal cytological findings on specimens from anus: Secondary | ICD-10-CM | POA: Insufficient documentation

## 2012-10-15 NOTE — Progress Notes (Signed)
Patient ID: Arthur Lynch, male   DOB: 1960/08/20, 52 y.o.   MRN: [id number] 52 yo M with HIV+ on TRV/ISN. He had anal pap 07-22-12 that showed ASCUS.   HIV 1 RNA Quant (copies/mL)  Date Value  08/26/2012 <20   12/05/2011 <20   05/31/2011 <20      CD4 T Cell Abs (cmm)  Date Value  08/26/2012 540   12/05/2011 500   05/31/2011 760     PRE-OPERATIVE DIAGNOSIS:  Anal condyloma, HRA with bx  POST-OPERATIVE DIAGNOSIS:  Anal condyloma, HRA with bx  PROCEDURE:  HRA with Bx.   SURGEON:  Damarian Priola  ASSISTANT: [place]  ANESTHESIA:   local  EBL: < 20 cc   SPECIMEN:  Source of Specimen:  L anterior anal canal  DISPOSITION OF SPECIMEN:  PATHOLOGY  COUNTS:  YES  PLAN OF CARE: home  PATIENT DISPOSITION:  home  INDICATION: ASCUS on anal PAP  OR FINDINGS: acetowhite lesion on L anterior anal canal. There was a similar lesion on the anterior midline that was less variegated.   DESCRIPTION: The patient was identified in the waiting area and taken to the exam room where they were laid on the table in the L lateral decubitus position.  The patient was then prepped and draped in the usual sterile fashion.  After this was completed, a sponge was soaked in 2% acetic acid was placed over the perianal region. This was allowed to soak for 1 minute. The sponge was removed and the perianal region was evaluated with a colposcope.  There were no external lesions.  The internal anal canal was evaluated via anoscopy with an anoscope.  There were 2 aceto-white lesions seen in the canal.  The L anteroloateral was bx. The medial was not.  After this was completed, hemostasis was achieved with gauze and a silver nitrate coated q-tip.

## 2012-10-16 ENCOUNTER — Telehealth: Payer: Self-pay

## 2012-10-16 NOTE — Telephone Encounter (Signed)
Pateint states  bright red bleeding present last night after sitting on church bench for a few hours.  He did notice bright red blood present after bowel movement this morning but no continuous bleeding. He has complaint of "nervous stomach" but thinks it may be related to stress.  He stomach pain is not in any particular area and tends to rotate in waves throughout abdomen. Patient says he feels this way mostly when stressed.  He was advised to continue to monitor for bleeding and to call our office if bleeding is continuous or has a heavy flow.  Patient states he feels fine  Laurell Josephs, RN

## 2012-10-16 NOTE — Telephone Encounter (Signed)
Patient came in for anosocopy 10/15/12 - was told what to bring and scheduled for RW paperwork - they will file to insurance, but he did not stop in to do the RW in case insurance didn't pay.

## 2012-10-21 ENCOUNTER — Telehealth: Payer: Self-pay | Admitting: Infectious Diseases

## 2012-10-21 NOTE — Telephone Encounter (Signed)
Called pt to discuss anoscopy bx. He had AIN1, needs repeat anoscopy in 6 months.

## 2012-12-05 ENCOUNTER — Encounter: Payer: Self-pay | Admitting: Infectious Disease

## 2012-12-05 ENCOUNTER — Ambulatory Visit (INDEPENDENT_AMBULATORY_CARE_PROVIDER_SITE_OTHER): Payer: PRIVATE HEALTH INSURANCE | Admitting: Infectious Disease

## 2012-12-05 VITALS — BP 112/77 | Temp 98.6°F | Wt 141.0 lb

## 2012-12-05 DIAGNOSIS — B20 Human immunodeficiency virus [HIV] disease: Secondary | ICD-10-CM

## 2012-12-05 DIAGNOSIS — K625 Hemorrhage of anus and rectum: Secondary | ICD-10-CM

## 2012-12-05 DIAGNOSIS — R85618 Other abnormal cytological findings on specimens from anus: Secondary | ICD-10-CM

## 2012-12-05 DIAGNOSIS — Z23 Encounter for immunization: Secondary | ICD-10-CM

## 2012-12-05 DIAGNOSIS — F1020 Alcohol dependence, uncomplicated: Secondary | ICD-10-CM

## 2012-12-05 DIAGNOSIS — R85619 Unspecified abnormal cytological findings in specimens from anus: Secondary | ICD-10-CM

## 2012-12-05 DIAGNOSIS — F329 Major depressive disorder, single episode, unspecified: Secondary | ICD-10-CM

## 2012-12-05 DIAGNOSIS — IMO0002 Reserved for concepts with insufficient information to code with codable children: Secondary | ICD-10-CM | POA: Insufficient documentation

## 2012-12-05 LAB — COMPLETE METABOLIC PANEL WITH GFR
Albumin: 4.5 g/dL (ref 3.5–5.2)
CO2: 29 mEq/L (ref 19–32)
Calcium: 10 mg/dL (ref 8.4–10.5)
Chloride: 102 mEq/L (ref 96–112)
GFR, Est African American: 82 mL/min
GFR, Est Non African American: 71 mL/min
Glucose, Bld: 81 mg/dL (ref 70–99)
Potassium: 4.1 mEq/L (ref 3.5–5.3)
Sodium: 139 mEq/L (ref 135–145)
Total Protein: 8.1 g/dL (ref 6.0–8.3)

## 2012-12-05 LAB — CBC WITH DIFFERENTIAL/PLATELET
Basophils Absolute: 0 10*3/uL (ref 0.0–0.1)
Basophils Relative: 0 % (ref 0–1)
Eosinophils Absolute: 0 10*3/uL (ref 0.0–0.7)
Eosinophils Relative: 0 % (ref 0–5)
HCT: 38.3 % — ABNORMAL LOW (ref 39.0–52.0)
Hemoglobin: 13.2 g/dL (ref 13.0–17.0)
Lymphocytes Relative: 36 % (ref 12–46)
Lymphs Abs: 1.8 10*3/uL (ref 0.7–4.0)
MCH: 35.1 pg — ABNORMAL HIGH (ref 26.0–34.0)
MCHC: 34.5 g/dL (ref 30.0–36.0)
MCV: 101.9 fL — ABNORMAL HIGH (ref 78.0–100.0)
Monocytes Absolute: 0.2 10*3/uL (ref 0.1–1.0)
Monocytes Relative: 5 % (ref 3–12)
Neutro Abs: 2.9 10*3/uL (ref 1.7–7.7)
Neutrophils Relative %: 59 % (ref 43–77)
Platelets: 313 10*3/uL (ref 150–400)
RBC: 3.76 MIL/uL — ABNORMAL LOW (ref 4.22–5.81)
RDW: 13.6 % (ref 11.5–15.5)
WBC: 5 10*3/uL (ref 4.0–10.5)

## 2012-12-05 NOTE — Progress Notes (Signed)
Subjective:    Patient ID: Arthur Lynch, male    DOB: March 23, 1960, 52 y.o.   MRN: [id number]  Rectal Bleeding  The current episode started more than 2 weeks ago. The onset was sudden. The problem occurs rarely. The problem has been resolved. The patient is experiencing no pain. The stool is described as hard. There was no prior unsuccessful therapy. Pertinent negatives include no fever, no abdominal pain, no diarrhea, no nausea, no vomiting, no hematuria, no chest pain, no coughing and no rash. He has been sleeping more.    Arthur Lynch is a 52 y.o. male who had  been doing superbly well on his  antiviral regimen, of isentress and truvada with undetectable viral load and health cd4 count.  He has has problems with anogenital warts and has an abnormal anal pap smear. He was seen by partner Dr. Ninetta Lights who performed anoscopy and obtained biopsies which were negative for malignancy. Apparently after the procedure the patient later had 2 episodes of blood with his stools. This made him severely depressed he began drinking alcohol heavily again. He returns to clinic today due to concerns and fears that his 2 episodes of blood in the rectum are harbinger of something more severe. I reassured him that these are unlikely to be of much consequence and could be potentially either related to internal hemorrhoids or potentially biopsy site minor bleeds. Had no further bleeding episodes since then no dizziness lightheadedness.  Reviewed various other antiretrovirals regimens including TRIUMEQ  which we have considered for him. I do need to have a HLA b701 before considering this change    He has continued to suffer from heat intolerance due to his chronic post herpetic neuralgic pain in his face. He feels that this was better controlled by a medicine in the past I believe it was possiby a tricyclic antidepressant.   Review of Systems  Constitutional: Negative for fever, chills, diaphoresis, activity  change, appetite change, fatigue and unexpected weight change.  HENT: Negative for congestion, sore throat, rhinorrhea, sneezing, trouble swallowing and sinus pressure.   Eyes: Negative for photophobia and visual disturbance.  Respiratory: Negative for cough, chest tightness, shortness of breath, wheezing and stridor.   Cardiovascular: Negative for chest pain, palpitations and leg swelling.  Gastrointestinal: Positive for hematochezia. Negative for nausea, vomiting, abdominal pain, diarrhea, constipation, blood in stool, abdominal distention and anal bleeding.  Genitourinary: Negative for dysuria, hematuria, flank pain and difficulty urinating.  Musculoskeletal: Negative for myalgias, back pain, joint swelling, arthralgias and gait problem.  Skin: Negative for color change, pallor, rash and wound.  Neurological: Negative for dizziness, tremors, weakness and light-headedness.  Hematological: Negative for adenopathy. Does not bruise/bleed easily.  Psychiatric/Behavioral: Positive for sleep disturbance and dysphoric mood. Negative for behavioral problems, confusion, decreased concentration and agitation.       Objective:   Physical Exam  Constitutional: He is oriented to person, place, and time. He appears well-developed and well-nourished. No distress.  HENT:  Head: Normocephalic and atraumatic.  Mouth/Throat: Oropharynx is clear and moist. No oropharyngeal exudate.  Eyes: Conjunctivae and EOM are normal. Pupils are equal, round, and reactive to light. No scleral icterus.  Neck: Normal range of motion. Neck supple. No JVD present.  Cardiovascular: Normal rate, regular rhythm and normal heart sounds.  Exam reveals no gallop and no friction rub.   No murmur heard. Pulmonary/Chest: Effort normal and breath sounds normal. No respiratory distress. He has no wheezes. He has no rales. He exhibits no tenderness.  Abdominal: He exhibits no distension and no mass. There is no tenderness. There is no  rebound and no guarding.  Genitourinary:     Musculoskeletal: He exhibits no edema and no tenderness.  Lymphadenopathy:    He has no cervical adenopathy.  Neurological: He is alert and oriented to person, place, and time. He has normal reflexes. He exhibits normal muscle tone. Coordination normal.  Skin: Skin is warm and dry. He is not diaphoretic. No erythema. No pallor.  Psychiatric: He has a normal mood and affect. His behavior is normal. Judgment and thought content normal. His speech is delayed.       Assessment & Plan:  HIV: continue  isentress and truvada for now recheck labs and change to [facility] if HLA [id number] is negative   ABnormal pap; sp colposcopy with normal biopsy by Dr. Ninetta Lights  Blood per rectum: minor isolated events, no need for further workup.  Alcoholism: Has relapsed with ask him to see  Max.  depression continue current antidepressants

## 2012-12-06 LAB — T-HELPER CELL (CD4) - (RCID CLINIC ONLY): CD4 T Cell Abs: 670 /uL (ref 400–2700)

## 2013-01-08 ENCOUNTER — Other Ambulatory Visit: Payer: PRIVATE HEALTH INSURANCE

## 2013-01-13 ENCOUNTER — Other Ambulatory Visit: Payer: Self-pay | Admitting: *Deleted

## 2013-01-13 DIAGNOSIS — B20 Human immunodeficiency virus [HIV] disease: Secondary | ICD-10-CM

## 2013-01-13 MED ORDER — RALTEGRAVIR POTASSIUM 400 MG PO TABS: 400.0000 mg | ORAL_TABLET | Freq: Two times a day (BID) | ORAL | Status: DC

## 2013-01-13 MED ORDER — EMTRICITABINE-TENOFOVIR DF 200-300 MG PO TABS: 1.0000 | ORAL_TABLET | Freq: Every day | ORAL | Status: DC

## 2013-01-22 ENCOUNTER — Ambulatory Visit (INDEPENDENT_AMBULATORY_CARE_PROVIDER_SITE_OTHER): Payer: PRIVATE HEALTH INSURANCE | Admitting: Infectious Disease

## 2013-01-22 ENCOUNTER — Encounter: Payer: Self-pay | Admitting: Infectious Disease

## 2013-01-22 VITALS — BP 106/75 | HR 90 | Temp 98.6°F | Ht 74.0 in | Wt 139.0 lb

## 2013-01-22 DIAGNOSIS — R21 Rash and other nonspecific skin eruption: Secondary | ICD-10-CM

## 2013-01-22 DIAGNOSIS — B20 Human immunodeficiency virus [HIV] disease: Secondary | ICD-10-CM

## 2013-01-22 DIAGNOSIS — G500 Trigeminal neuralgia: Secondary | ICD-10-CM

## 2013-01-22 DIAGNOSIS — G4700 Insomnia, unspecified: Secondary | ICD-10-CM

## 2013-01-22 MED ORDER — TRIAMCINOLONE ACETONIDE 0.5 % EX OINT: 1.0000 "application " | TOPICAL_OINTMENT | Freq: Two times a day (BID) | CUTANEOUS | Status: DC

## 2013-01-22 MED ORDER — ABACAVIR-DOLUTEGRAVIR-LAMIVUD 600-50-300 MG PO TABS: 1.0000 | ORAL_TABLET | Freq: Every day | ORAL | Status: DC

## 2013-01-22 NOTE — Progress Notes (Signed)
  Subjective:    Patient ID: Arthur Lynch, male    DOB: [birth year], 52 y.o.   MRN: [id number]  HPI  Arthur Lynch is a 52 y.o. male who has  been doing superbly well on his  antiviral regimen, of isentress and truvada with undetectable viral load and health cd4 count.  He has has problems with anogenital warts and has an abnormal anal pap smear. He was seen by partner Dr. Ninetta Lights who performed anoscopy and obtained biopsies which were negative for malignancy.  WE discussed change to Triumeq and he was agreeable to this  HE WOULD LIKE THE PILL HE TOOK IN 2007, 2008, IT WAS A DARK ORANGE CIRCULAR PILL THAT HE TOOK AT BEDTIME AND THIS HELPED WITH SLEEP AND WITH HIS NEURALGIA ==WILL CHECK WITH MICHELLE   Review of Systems  Constitutional: Negative for chills, diaphoresis, activity change, appetite change, fatigue and unexpected weight change.  HENT: Negative for congestion, rhinorrhea, sinus pressure, sneezing, sore throat and trouble swallowing.   Eyes: Negative for photophobia and visual disturbance.  Respiratory: Negative for chest tightness, shortness of breath, wheezing and stridor.   Cardiovascular: Negative for palpitations and leg swelling.  Gastrointestinal: Negative for constipation, blood in stool, abdominal distention and anal bleeding.  Genitourinary: Negative for dysuria, flank pain and difficulty urinating.  Musculoskeletal: Negative for arthralgias, back pain, gait problem, joint swelling and myalgias.  Skin: Negative for color change, pallor and wound.  Neurological: Negative for dizziness, tremors, weakness and light-headedness.  Hematological: Negative for adenopathy. Does not bruise/bleed easily.  Psychiatric/Behavioral: Positive for sleep disturbance and dysphoric mood. Negative for behavioral problems, confusion, decreased concentration and agitation.       Objective:   Physical Exam  Constitutional: He is oriented to person, place, and time. He appears  well-developed and well-nourished. No distress.  HENT:  Head: Normocephalic and atraumatic.  Mouth/Throat: Oropharynx is clear and moist. No oropharyngeal exudate.  Eyes: Conjunctivae and EOM are normal. Pupils are equal, round, and reactive to light. No scleral icterus.  Neck: Normal range of motion. Neck supple. No JVD present.  Cardiovascular: Normal rate, regular rhythm and normal heart sounds.  Exam reveals no gallop and no friction rub.   No murmur heard. Pulmonary/Chest: Effort normal and breath sounds normal. No respiratory distress. He has no wheezes. He has no rales. He exhibits no tenderness.  Abdominal: He exhibits no distension and no mass. There is no tenderness. There is no rebound and no guarding.  Musculoskeletal: He exhibits no edema and no tenderness.  Lymphadenopathy:    He has no cervical adenopathy.  Neurological: He is alert and oriented to person, place, and time. He has normal reflexes. He exhibits normal muscle tone. Coordination normal.  Skin: Skin is warm and dry. He is not diaphoretic. No erythema. No pallor.     Psychiatric: He has a normal mood and affect. His behavior is normal. Judgment and thought content normal. His speech is delayed.       Assessment & Plan:  NFA:OZHYQM to TRIUMEQ and bring back in next 1-2 months for recheck of VL and CD4  Alcoholism: Has relapsed when last seen by me and I asked him to  Meet with Max, but doesn't appear tohave met with Max  depression continue current antidepressants   Neuralgia: will try to figure out what tricyclic that he was on before  Rash: try triamcinolone

## 2013-02-13 ENCOUNTER — Telehealth: Payer: Self-pay | Admitting: Infectious Disease

## 2013-02-13 MED ORDER — AMITRIPTYLINE HCL 50 MG PO TABS: 50.0000 mg | ORAL_TABLET | Freq: Every day | ORAL | Status: DC

## 2013-02-13 NOTE — Telephone Encounter (Signed)
Reviewed his description of med with Estella Husk and she believes he was likely elavil that he was taking before

## 2013-02-26 ENCOUNTER — Other Ambulatory Visit: Payer: Medicare (Managed Care)

## 2013-02-26 DIAGNOSIS — B20 Human immunodeficiency virus [HIV] disease: Secondary | ICD-10-CM

## 2013-02-26 DIAGNOSIS — K625 Hemorrhage of anus and rectum: Secondary | ICD-10-CM

## 2013-02-26 DIAGNOSIS — F329 Major depressive disorder, single episode, unspecified: Secondary | ICD-10-CM

## 2013-02-26 DIAGNOSIS — Z23 Encounter for immunization: Secondary | ICD-10-CM

## 2013-02-26 DIAGNOSIS — F1020 Alcohol dependence, uncomplicated: Secondary | ICD-10-CM

## 2013-02-26 DIAGNOSIS — R85619 Unspecified abnormal cytological findings in specimens from anus: Secondary | ICD-10-CM

## 2013-02-26 LAB — COMPLETE METABOLIC PANEL WITH GFR
Albumin: 4.3 g/dL (ref 3.5–5.2)
Alkaline Phosphatase: 47 U/L (ref 39–117)
BUN: 13 mg/dL (ref 6–23)
CO2: 28 mEq/L (ref 19–32)
Calcium: 9.4 mg/dL (ref 8.4–10.5)
GFR, Est African American: 74 mL/min
GFR, Est Non African American: 64 mL/min
Glucose, Bld: 77 mg/dL (ref 70–99)
Potassium: 4.3 mEq/L (ref 3.5–5.3)
Sodium: 140 mEq/L (ref 135–145)

## 2013-02-26 LAB — CBC WITH DIFFERENTIAL/PLATELET
Basophils Absolute: 0 10*3/uL (ref 0.0–0.1)
Basophils Relative: 1 % (ref 0–1)
HCT: 38.1 % — ABNORMAL LOW (ref 39.0–52.0)
Lymphocytes Relative: 56 % — ABNORMAL HIGH (ref 12–46)
Lymphs Abs: 1.5 10*3/uL (ref 0.7–4.0)
MCHC: 34.1 g/dL (ref 30.0–36.0)
Monocytes Absolute: 0.2 10*3/uL (ref 0.1–1.0)
Neutro Abs: 1 10*3/uL — ABNORMAL LOW (ref 1.7–7.7)
Neutrophils Relative %: 35 % — ABNORMAL LOW (ref 43–77)
Platelets: 290 10*3/uL (ref 150–400)
RDW: 13.3 % (ref 11.5–15.5)
WBC: 2.7 10*3/uL — ABNORMAL LOW (ref 4.0–10.5)

## 2013-02-27 LAB — HIV-1 RNA QUANT-NO REFLEX-BLD: HIV-1 RNA Quant, Log: <1.3 {Log} (ref <1.30)

## 2013-02-27 LAB — PATHOLOGIST SMEAR REVIEW

## 2013-02-28 LAB — T-HELPER CELL (CD4) - (RCID CLINIC ONLY)
CD4 % Helper T Cell: 36 % (ref 33–55)
CD4 T Cell Abs: 540 /uL (ref 400–2700)

## 2013-03-07 ENCOUNTER — Telehealth: Payer: Self-pay | Admitting: Internal Medicine

## 2013-03-07 NOTE — Telephone Encounter (Signed)
Mr. Dockendorf called and reported having recent rhinorrhea, sneezing, nasal congestion and cough. He has not had any fever or shortness of breath. I suggested using generic Afrin nasal spray.

## 2013-03-08 ENCOUNTER — Encounter (HOSPITAL_COMMUNITY): Payer: Self-pay | Admitting: Emergency Medicine

## 2013-03-08 ENCOUNTER — Emergency Department (HOSPITAL_COMMUNITY)
Admission: EM | Admit: 2013-03-08 | Discharge: 2013-03-08 | Disposition: A | Discharge status: Home / Self Care | Payer: PRIVATE HEALTH INSURANCE | Attending: Emergency Medicine | Admitting: Emergency Medicine

## 2013-03-08 ENCOUNTER — Emergency Department (HOSPITAL_COMMUNITY): Payer: PRIVATE HEALTH INSURANCE

## 2013-03-08 DIAGNOSIS — Z862 Personal history of diseases of the blood and blood-forming organs and certain disorders involving the immune mechanism: Secondary | ICD-10-CM | POA: Insufficient documentation

## 2013-03-08 DIAGNOSIS — Z8659 Personal history of other mental and behavioral disorders: Secondary | ICD-10-CM | POA: Insufficient documentation

## 2013-03-08 DIAGNOSIS — Z8669 Personal history of other diseases of the nervous system and sense organs: Secondary | ICD-10-CM | POA: Insufficient documentation

## 2013-03-08 DIAGNOSIS — R011 Cardiac murmur, unspecified: Secondary | ICD-10-CM | POA: Insufficient documentation

## 2013-03-08 DIAGNOSIS — Z859 Personal history of malignant neoplasm, unspecified: Secondary | ICD-10-CM | POA: Insufficient documentation

## 2013-03-08 DIAGNOSIS — Z79899 Other long term (current) drug therapy: Secondary | ICD-10-CM | POA: Insufficient documentation

## 2013-03-08 DIAGNOSIS — J159 Unspecified bacterial pneumonia: Secondary | ICD-10-CM | POA: Insufficient documentation

## 2013-03-08 DIAGNOSIS — Z880 Allergy status to penicillin: Secondary | ICD-10-CM | POA: Insufficient documentation

## 2013-03-08 DIAGNOSIS — J189 Pneumonia, unspecified organism: Secondary | ICD-10-CM

## 2013-03-08 DIAGNOSIS — Z21 Asymptomatic human immunodeficiency virus [HIV] infection status: Secondary | ICD-10-CM | POA: Insufficient documentation

## 2013-03-08 DIAGNOSIS — Z792 Long term (current) use of antibiotics: Secondary | ICD-10-CM | POA: Insufficient documentation

## 2013-03-08 LAB — CBC
HCT: 36.8 % — ABNORMAL LOW (ref 39.0–52.0)
MCH: 36.4 pg — ABNORMAL HIGH (ref 26.0–34.0)
MCV: 103.1 fL — ABNORMAL HIGH (ref 78.0–100.0)
Platelets: 190 10*3/uL (ref 150–400)
RBC: 3.57 MIL/uL — ABNORMAL LOW (ref 4.22–5.81)

## 2013-03-08 LAB — D-DIMER, QUANTITATIVE (NOT AT ARMC): D-Dimer, Quant: 0.31 ug/mL-FEU (ref 0.00–0.48)

## 2013-03-08 LAB — BASIC METABOLIC PANEL
BUN: 10 mg/dL (ref 6–23)
CO2: 25 mEq/L (ref 19–32)
Calcium: 8.9 mg/dL (ref 8.4–10.5)
Creatinine, Ser: 1.25 mg/dL (ref 0.50–1.35)
Glucose, Bld: 89 mg/dL (ref 70–99)

## 2013-03-08 LAB — POCT I-STAT TROPONIN I: Troponin i, poc: 0 ng/mL (ref 0.00–0.08)

## 2013-03-08 MED ORDER — SODIUM CHLORIDE 0.9 % IV BOLUS (SEPSIS)
1000.0000 mL | Freq: Once | INTRAVENOUS | Status: AC
  Administered 2013-03-08: 1000 mL via INTRAVENOUS

## 2013-03-08 MED ORDER — LEVOFLOXACIN IN D5W 750 MG/150ML IV SOLN
750.0000 mg | Freq: Once | INTRAVENOUS | Status: AC
  Administered 2013-03-08: 750 mg via INTRAVENOUS
  Filled 2013-03-08: qty 150

## 2013-03-08 MED ORDER — ACETAMINOPHEN 325 MG PO TABS
650.0000 mg | ORAL_TABLET | Freq: Once | ORAL | Status: AC
  Administered 2013-03-08: 650 mg via ORAL
  Filled 2013-03-08: qty 2

## 2013-03-08 MED ORDER — OSELTAMIVIR PHOSPHATE 75 MG PO CAPS: 75.0000 mg | ORAL_CAPSULE | Freq: Two times a day (BID) | ORAL | Status: DC

## 2013-03-08 MED ORDER — KETOROLAC TROMETHAMINE 30 MG/ML IJ SOLN
30.0000 mg | Freq: Once | INTRAMUSCULAR | Status: AC
  Administered 2013-03-08: 30 mg via INTRAVENOUS
  Filled 2013-03-08: qty 1

## 2013-03-08 MED ORDER — MOXIFLOXACIN HCL 400 MG PO TABS: 400.0000 mg | ORAL_TABLET | Freq: Every day | ORAL | Status: DC

## 2013-03-08 NOTE — ED Notes (Signed)
Pt reports a pressure in RT to Left chest for one week constant. Pt reports CP is worse while standing and pain improves when he is flat . Pt took tusinex and night time cold . Pt also takes HIV meds .

## 2013-03-08 NOTE — ED Provider Notes (Signed)
CSN: [id number]     Arrival date & time 03/08/13  9147 History   First MD Initiated Contact with Patient 03/08/13 [phone]     Chief Complaint  Patient presents with  . Chest Pain   (Consider location/radiation/quality/duration/timing/severity/associated sxs/prior Treatment) HPI Pt presenting with c/o cough, chest aching and fever.  Symptoms began over the past week.  Pain in chest has been constant.  No difficulty breathing except due to frequent coughing.  No vomiting.  No sick contacts.  Is taking HIV meds regularly, normal CD4 count and low viral load- followed by ID.  Cough is nonproductive. He has tried tussinex for cough without much relief.  There are no other associated systemic symptoms, there are no other alleviating or modifying factors.   Past Medical History  Diagnosis Date  . Murmur, heart   . HIV (human immunodeficiency virus infection)   . Abnormal Pap smear   . Anemia   . Cancer   . Depression   . Neuromuscular disorder   . Substance abuse    Past Surgical History  Procedure Laterality Date  . Anoscopy     No family history on file. History  Substance Use Topics  . Smoking status: Never Smoker   . Smokeless tobacco: Never Used  . Alcohol Use: No    Review of Systems ROS reviewed and all otherwise negative except for mentioned in HPI  Allergies  Penicillins  Home Medications   Current Outpatient Rx  Name  Route  Sig  Dispense  Refill  . Abacavir-Dolutegravir-Lamivud (TRIUMEQ) 600-50-300 MG TABS   Oral   Take 1 tablet by mouth daily.   30 tablet   11   . guaifenesin (ROBITUSSIN) 100 MG/5ML syrup   Oral   Take 200 mg by mouth 3 (three) times daily as needed for cough.         Marland Kitchen OVER THE COUNTER MEDICATION   Oral   Take 1 capsule by mouth 2 (two) times daily as needed (MED NAME: Cough and flu gelcaps).         . moxifloxacin (AVELOX) 400 MG tablet   Oral   Take 1 tablet (400 mg total) by mouth daily at 8 pm.   10 tablet   0   .  oseltamivir (TAMIFLU) 75 MG capsule   Oral   Take 1 capsule (75 mg total) by mouth every 12 (twelve) hours.   10 capsule   0   . triamcinolone ointment (KENALOG) 0.5 %   Topical   Apply 1 application topically 2 (two) times daily.   30 g   0    BP 116/79  Pulse 78  Temp(Src) 99.5 F (37.5 C) (Oral)  Resp 19  Ht 6\' 2"  (1.88 m)  Wt 135 lb (61.236 kg)  BMI 17.33 kg/m2  SpO2 99% Vitals reviewed Physical Exam Physical Examination: General appearance - alert, well appearing, and in no distress Mental status - alert, oriented to person, place, and time Eyes - no conjunctival injection, no scleral icterus Mouth - mucous membranes moist, pharynx normal without lesions Chest - clear to auscultation, no wheezes, rales or rhonchi, symmetric air entry Heart - normal rate, regular rhythm, normal S1, S2, no murmurs, rubs, clicks or gallops Abdomen - soft, nontender, nondistended, no masses or organomegaly Extremities - peripheral pulses normal, no pedal edema, no clubbing or cyanosis Skin - normal coloration and turgor, no rashes  ED Course  Procedures (including critical care time) Labs Review Labs Reviewed  CBC - Abnormal;  Notable for the following:    RBC 3.57 (*)    HCT 36.8 (*)    MCV 103.1 (*)    MCH 36.4 (*)    All other components within normal limits  BASIC METABOLIC PANEL - Abnormal; Notable for the following:    GFR calc non Af Amer 66 (*)    GFR calc Af Amer 76 (*)    All other components within normal limits  D-DIMER, QUANTITATIVE  POCT I-STAT TROPONIN I   Imaging Review Dg Chest 2 View  03/08/2013   CLINICAL DATA:  Chest pain.  EXAM: CHEST - 2 VIEW  COMPARISON:  04/05/2012 abdominal series.  FINDINGS: There is some patchy opacity in the infrahilar aspect of the right lung felt to most likely represent atelectasis. Early infiltrate may be present. No edema, pleural effusion or pneumothorax is identified. The heart size and mediastinal contours are within normal  limits. Visualized bony structures are unremarkable.  IMPRESSION: Patchy right infrahilar pulmonary opacity felt to most likely represent atelectasis. Early infiltrate is not excluded.   Electronically Signed   By: Irish Lack M.D.   On: 03/08/2013 10:21    EKG Interpretation    Date/Time:  Saturday March 08 2013 08:58:03 EST Ventricular Rate:  106 PR Interval:  138 QRS Duration: 88 QT Interval:  314 QTC Calculation: 417 R Axis:   89 Text Interpretation:  Sinus tachycardia Right atrial enlargement Borderline ECG No old tracing to compare Confirmed by [facility]  MD, Junell Cullifer [phone]) on 03/08/2013 9:56:23 AM            MDM   1. Community acquired pneumonia    Pt presenting with c/o cough, chest soreness, fever- CXR reveals are of right sided pneumonia versus atelectasis- images reviewed by me and will treat for pneumonia given symptoms, started on IV abx in the ED, d/c with avelox and tamiflu for possible influenza.  Pt feeling improved after meds.  Discharged with strict return precautions.  Pt agreeable with plan.    Ethelda Chick, MD 03/08/13 [phone]

## 2013-03-08 NOTE — ED Notes (Signed)
Patient mobile to the bathroom.

## 2013-03-08 NOTE — ED Notes (Signed)
Spoke with Lab reporting can add on D-dimer from prior blue top drawn this morning.

## 2013-03-11 ENCOUNTER — Encounter (HOSPITAL_COMMUNITY): Payer: Self-pay | Admitting: Emergency Medicine

## 2013-03-11 ENCOUNTER — Emergency Department (HOSPITAL_COMMUNITY)
Admission: EM | Admit: 2013-03-11 | Discharge: 2013-03-11 | Disposition: A | Discharge status: Home / Self Care | Payer: PRIVATE HEALTH INSURANCE | Attending: Emergency Medicine | Admitting: Emergency Medicine

## 2013-03-11 ENCOUNTER — Emergency Department (HOSPITAL_COMMUNITY): Payer: PRIVATE HEALTH INSURANCE

## 2013-03-11 DIAGNOSIS — R011 Cardiac murmur, unspecified: Secondary | ICD-10-CM | POA: Insufficient documentation

## 2013-03-11 DIAGNOSIS — Z21 Asymptomatic human immunodeficiency virus [HIV] infection status: Secondary | ICD-10-CM | POA: Insufficient documentation

## 2013-03-11 DIAGNOSIS — Z862 Personal history of diseases of the blood and blood-forming organs and certain disorders involving the immune mechanism: Secondary | ICD-10-CM | POA: Insufficient documentation

## 2013-03-11 DIAGNOSIS — Z792 Long term (current) use of antibiotics: Secondary | ICD-10-CM | POA: Insufficient documentation

## 2013-03-11 DIAGNOSIS — Z8669 Personal history of other diseases of the nervous system and sense organs: Secondary | ICD-10-CM | POA: Insufficient documentation

## 2013-03-11 DIAGNOSIS — Z880 Allergy status to penicillin: Secondary | ICD-10-CM | POA: Insufficient documentation

## 2013-03-11 DIAGNOSIS — J189 Pneumonia, unspecified organism: Secondary | ICD-10-CM

## 2013-03-11 DIAGNOSIS — Z8659 Personal history of other mental and behavioral disorders: Secondary | ICD-10-CM | POA: Insufficient documentation

## 2013-03-11 DIAGNOSIS — J159 Unspecified bacterial pneumonia: Secondary | ICD-10-CM | POA: Insufficient documentation

## 2013-03-11 DIAGNOSIS — Z79899 Other long term (current) drug therapy: Secondary | ICD-10-CM | POA: Insufficient documentation

## 2013-03-11 DIAGNOSIS — Z859 Personal history of malignant neoplasm, unspecified: Secondary | ICD-10-CM | POA: Insufficient documentation

## 2013-03-11 MED ORDER — GUAIFENESIN ER 1200 MG PO TB12: 1.0000 | ORAL_TABLET | Freq: Two times a day (BID) | ORAL | Status: DC

## 2013-03-11 MED ORDER — ALBUTEROL SULFATE (2.5 MG/3ML) 0.083% IN NEBU
5.0000 mg | INHALATION_SOLUTION | Freq: Once | RESPIRATORY_TRACT | Status: AC
  Administered 2013-03-11: 5 mg via RESPIRATORY_TRACT
  Filled 2013-03-11: qty 6

## 2013-03-11 MED ORDER — KETOROLAC TROMETHAMINE 30 MG/ML IJ SOLN
30.0000 mg | Freq: Once | INTRAMUSCULAR | Status: AC
  Administered 2013-03-11: 30 mg via INTRAVENOUS
  Filled 2013-03-11: qty 1

## 2013-03-11 MED ORDER — IBUPROFEN 800 MG PO TABS: 800.0000 mg | ORAL_TABLET | Freq: Three times a day (TID) | ORAL | Status: DC | PRN

## 2013-03-11 MED ORDER — ACETAMINOPHEN-CODEINE 120-12 MG/5ML PO SOLN: 10.0000 mL | ORAL | Status: DC | PRN

## 2013-03-11 MED ORDER — SODIUM CHLORIDE 0.9 % IV BOLUS (SEPSIS)
1000.0000 mL | Freq: Once | INTRAVENOUS | Status: AC
  Administered 2013-03-11: 1000 mL via INTRAVENOUS

## 2013-03-11 NOTE — ED Provider Notes (Signed)
CSN: [id number]     Arrival date & time 03/11/13  4098 History   First MD Initiated Contact with Patient 03/11/13 [phone]     Chief Complaint  Patient presents with  . Pneumonia   (Consider location/radiation/quality/duration/timing/severity/associated sxs/prior Treatment) HPI Patient presents emergency department with continued cough and right-sided chest discomfort with coughing.  Patient, states he was seen here for pneumonia.  On Saturday.  He states that he does not feel like his cough is any better.  Patient denies nausea, vomiting, headache, blurred vision, weakness, dizziness, back pain, rash, or syncope.  The patient, states, that he has not had any fevers.  Patient denies taking any other medications prior to arrival than what he was prescribed. Past Medical History  Diagnosis Date  . Murmur, heart   . HIV (human immunodeficiency virus infection)   . Abnormal Pap smear   . Anemia   . Cancer   . Depression   . Neuromuscular disorder   . Substance abuse    Past Surgical History  Procedure Laterality Date  . Anoscopy     History reviewed. No pertinent family history. History  Substance Use Topics  . Smoking status: Never Smoker   . Smokeless tobacco: Never Used  . Alcohol Use: No    Review of Systems All other systems negative except as documented in the HPI. All pertinent positives and negatives as reviewed in the HPI. Allergies  Penicillins  Home Medications   Current Outpatient Rx  Name  Route  Sig  Dispense  Refill  . Abacavir-Dolutegravir-Lamivud (TRIUMEQ) 600-50-300 MG TABS   Oral   Take 1 tablet by mouth daily.   30 tablet   11   . moxifloxacin (AVELOX) 400 MG tablet   Oral   Take 1 tablet (400 mg total) by mouth daily at 8 pm.   10 tablet   0   . oseltamivir (TAMIFLU) 75 MG capsule   Oral   Take 1 capsule (75 mg total) by mouth every 12 (twelve) hours.   10 capsule   0    BP 126/89  Pulse 92  Temp(Src) 97.9 F (36.6 C) (Oral)  Resp 18   SpO2 100% Physical Exam  Constitutional: He is oriented to person, place, and time.  HENT:  Head: Normocephalic and atraumatic.  Mouth/Throat: Oropharynx is clear and moist.  Eyes: Pupils are equal, round, and reactive to light.  Neck: Normal range of motion. Neck supple.  Cardiovascular: Normal rate, regular rhythm and normal heart sounds.  Exam reveals no gallop and no friction rub.   No murmur heard. Pulmonary/Chest: Effort normal and breath sounds normal. No respiratory distress. He has no wheezes.  Neurological: He is alert and oriented to person, place, and time. He exhibits normal muscle tone. Coordination normal.  Skin: Skin is warm and dry.    ED Course  Procedures (including critical care time) Labs Review Labs Reviewed - No data to display Imaging Review Dg Chest 2 View  03/11/2013   CLINICAL DATA:  Right-sided chest pain and shortness of breath.  EXAM: CHEST  2 VIEW  COMPARISON:  03/08/2013  FINDINGS: Heart size is normal. Mediastinal contours are normal. The left lung is clear. On the right, there has been resolution of patchy infiltrate seen in the lower lung. No worsening any findings. No effusions. No significant bony finding.  IMPRESSION: Resolution of patchy infiltrate previously seen in the right lower lung. No active disease evident presently.   Electronically Signed   By: Paulina Fusi  M.D.   On: 03/11/2013 07:53    EKG Interpretation   None      the patient feels improved following a breathing treatment.  An IV fluids along with Toradol.  The patient is going to be given another breathing treatment and referred back to his primary care Dr. told to return here as needed.  Upon reviewing the x-ray, the pneumonia appears to have resolved.  She is advised to increase fluid intake    Carlyle Dolly, PA-C 03/11/13 1104

## 2013-03-11 NOTE — ED Notes (Signed)
Pa has requested that pt be given a sandwich to eat. Bag lunch to pt with soda. Will call resp therapy to give treatment when pt is finished eating.

## 2013-03-11 NOTE — ED Notes (Signed)
Pt family is visiting at bedside. They are wearing masks and have gelled in to visit

## 2013-03-11 NOTE — ED Notes (Signed)
RT at bedside-admin 2nd albuterol tx.

## 2013-03-11 NOTE — ED Notes (Signed)
Pa in to reevaluate patient

## 2013-03-11 NOTE — ED Notes (Signed)
Pt denies fever. States did produce small amt of yellow sputum with cough. States chest wall is very painful to take deep breaths or move about. States some diarrhea but no vomiting.

## 2013-03-11 NOTE — ED Notes (Signed)
Pt c/o right sided CP that was seen for on Saturday; pt sts has PNA and just not improving

## 2013-03-11 NOTE — ED Notes (Signed)
Diagnosed Saturday with pneumonia. States compliant with meds. Not improving

## 2013-03-11 NOTE — ED Notes (Signed)
PT FLAGGED FOR REEVAL.

## 2013-03-12 NOTE — ED Provider Notes (Signed)
Medical screening examination/treatment/procedure(s) were performed by non-physician practitioner and as supervising physician I was immediately available for consultation/collaboration.  EKG Interpretation   None      '  Itzel Mckibbin L Yula Crotwell, MD 03/12/13 1455 

## 2013-03-19 ENCOUNTER — Ambulatory Visit: Payer: PRIVATE HEALTH INSURANCE | Admitting: Infectious Disease

## 2013-03-20 ENCOUNTER — Ambulatory Visit: Payer: PRIVATE HEALTH INSURANCE | Admitting: Infectious Disease

## 2013-03-26 ENCOUNTER — Encounter: Payer: Self-pay | Admitting: Infectious Disease

## 2013-03-26 ENCOUNTER — Ambulatory Visit (INDEPENDENT_AMBULATORY_CARE_PROVIDER_SITE_OTHER): Payer: PRIVATE HEALTH INSURANCE | Admitting: Infectious Disease

## 2013-03-26 VITALS — BP 144/98 | HR 76 | Temp 98.3°F | Wt 148.5 lb

## 2013-03-26 DIAGNOSIS — F3289 Other specified depressive episodes: Secondary | ICD-10-CM

## 2013-03-26 DIAGNOSIS — F1020 Alcohol dependence, uncomplicated: Secondary | ICD-10-CM

## 2013-03-26 DIAGNOSIS — F329 Major depressive disorder, single episode, unspecified: Secondary | ICD-10-CM

## 2013-03-26 DIAGNOSIS — F32A Depression, unspecified: Secondary | ICD-10-CM

## 2013-03-26 DIAGNOSIS — G500 Trigeminal neuralgia: Secondary | ICD-10-CM

## 2013-03-26 DIAGNOSIS — G4700 Insomnia, unspecified: Secondary | ICD-10-CM

## 2013-03-26 DIAGNOSIS — B20 Human immunodeficiency virus [HIV] disease: Secondary | ICD-10-CM

## 2013-03-26 DIAGNOSIS — J1100 Influenza due to unidentified influenza virus with unspecified type of pneumonia: Secondary | ICD-10-CM

## 2013-03-26 DIAGNOSIS — J189 Pneumonia, unspecified organism: Secondary | ICD-10-CM

## 2013-03-26 MED ORDER — AMITRIPTYLINE HCL 50 MG PO TABS: 50.0000 mg | ORAL_TABLET | Freq: Every day | ORAL | Status: DC

## 2013-03-26 MED ORDER — ACETAMINOPHEN-CODEINE 120-12 MG/5ML PO SOLN: 10.0000 mL | Freq: Four times a day (QID) | ORAL | Status: DC | PRN

## 2013-03-26 NOTE — Progress Notes (Signed)
Subjective:    Patient ID: Arthur Lynch, male    DOB: 1960/05/18, 53 y.o.   MRN: [id number]  HPI   Arthur Lynch is a 53 y.o. male who has  been doing superbly well on his  antiviral regimen, of isentress and truvada whom we changde to Triumeq and he was agreeable to this. He continued with undetectable viral load and healthy CD4 count on this regimen.  He was recently seen in emergent department and given and. Diagnosis of influenza and possible right middle lobe and pneumonia and treated with Avelox. His fevers have subsided but he continues to cough and has run out of his Tylenol with codeine. I am agreeable to push grafting is again performed until his cough can resolve. He states he has no history of asthma COPD smoking or reactive airway disease.  We went over his try cyclic antidepressant which we feel was most likely amitriptyline helped him in the past I informed him I was sending in a prescription for this which I did last time but of which he was apparently unaware.  He is also interested in getting help with housing and more affordable housing specifically.   Review of Systems  Constitutional: Positive for fever, chills, diaphoresis and fatigue. Negative for activity change, appetite change and unexpected weight change.  HENT: Negative for congestion, rhinorrhea, sinus pressure, sneezing, sore throat and trouble swallowing.   Eyes: Negative for photophobia and visual disturbance.  Respiratory: Positive for cough and shortness of breath. Negative for chest tightness, wheezing and stridor.   Cardiovascular: Negative for chest pain, palpitations and leg swelling.  Gastrointestinal: Negative for nausea, vomiting, abdominal pain, diarrhea, constipation, blood in stool, abdominal distention and anal bleeding.  Genitourinary: Negative for dysuria, hematuria, flank pain and difficulty urinating.  Musculoskeletal: Negative for arthralgias, back pain, gait problem, joint swelling  and myalgias.  Skin: Negative for color change, pallor, rash and wound.  Neurological: Negative for dizziness, tremors, weakness and light-headedness.  Hematological: Negative for adenopathy. Does not bruise/bleed easily.  Psychiatric/Behavioral: Positive for sleep disturbance and dysphoric mood. Negative for behavioral problems, confusion, decreased concentration and agitation.       Objective:   Physical Exam  Constitutional: He is oriented to person, place, and time. He appears well-developed and well-nourished. No distress.  HENT:  Head: Normocephalic and atraumatic.  Mouth/Throat: Oropharynx is clear and moist. No oropharyngeal exudate.  Eyes: Conjunctivae and EOM are normal. Pupils are equal, round, and reactive to light. No scleral icterus.  Neck: Normal range of motion. Neck supple. No JVD present.  Cardiovascular: Normal rate, regular rhythm and normal heart sounds.  Exam reveals no gallop and no friction rub.   No murmur heard. Pulmonary/Chest: Effort normal and breath sounds normal. No respiratory distress. He has no wheezes. He has no rales. He exhibits no tenderness.  Abdominal: He exhibits no distension and no mass. There is no tenderness. There is no rebound and no guarding.  Musculoskeletal: He exhibits no edema and no tenderness.  Lymphadenopathy:    He has no cervical adenopathy.  Neurological: He is alert and oriented to person, place, and time. He has normal reflexes. He exhibits normal muscle tone. Coordination normal.  Skin: Skin is warm and dry. He is not diaphoretic. No erythema. No pallor.     Psychiatric: He has a normal mood and affect. His behavior is normal. Judgment and thought content normal. His speech is delayed.       Assessment & Plan:  HIV: Doing very  well on [place] will bring back in 6 months time. I spent greater than 40 minutes with the patient including greater than 50% of time in face to face counsel of the patient and in coordination of their  care.   Alcoholism: Has relapsed when last seen by me. He states he is seeing a Social worker at [facility] [place].  depression : Going to amitriptyline at bedtime   Neuralgia: Will change over to amitriptyline  Pneumonia: Rate graphically resolved but he does have persistent cough  Persistent cough continue cough suppressant for now if this continues beyond that will try to get pulmonary function tests

## 2013-07-16 ENCOUNTER — Telehealth: Payer: Self-pay | Admitting: *Deleted

## 2013-07-16 NOTE — Telephone Encounter (Signed)
Patient left message requesting "something for the nerves/shingles" on his face.  RN called the patient back for more information, and to offer him an appointment.  Landis Gandy, RN

## 2013-07-16 NOTE — Telephone Encounter (Signed)
Patient called back and left another voice mail requesting something for pain for his shingles. Tried to reach patient and got voice mail. Last seen 03/26/13 and he should schedule an appointment to see Dr. Tommy Medal.

## 2013-07-22 ENCOUNTER — Ambulatory Visit (INDEPENDENT_AMBULATORY_CARE_PROVIDER_SITE_OTHER): Payer: PRIVATE HEALTH INSURANCE | Admitting: Internal Medicine

## 2013-07-22 ENCOUNTER — Encounter: Payer: Self-pay | Admitting: Internal Medicine

## 2013-07-22 VITALS — BP 120/87 | HR 102 | Temp 98.7°F | Wt 137.0 lb

## 2013-07-22 DIAGNOSIS — B20 Human immunodeficiency virus [HIV] disease: Secondary | ICD-10-CM

## 2013-07-22 DIAGNOSIS — B0229 Other postherpetic nervous system involvement: Secondary | ICD-10-CM

## 2013-07-22 LAB — CBC WITH DIFFERENTIAL/PLATELET
BASOS PCT: 0 % (ref 0–1)
Basophils Absolute: 0 10*3/uL (ref 0.0–0.1)
Eosinophils Absolute: 0 10*3/uL (ref 0.0–0.7)
Eosinophils Relative: 1 % (ref 0–5)
HEMATOCRIT: 38.1 % — AB (ref 39.0–52.0)
Hemoglobin: 13.1 g/dL (ref 13.0–17.0)
Lymphocytes Relative: 56 % — ABNORMAL HIGH (ref 12–46)
Lymphs Abs: 2.1 10*3/uL (ref 0.7–4.0)
MCH: 34.7 pg — AB (ref 26.0–34.0)
MCHC: 34.4 g/dL (ref 30.0–36.0)
MCV: 101.1 fL — ABNORMAL HIGH (ref 78.0–100.0)
MONO ABS: 0.3 10*3/uL (ref 0.1–1.0)
MONOS PCT: 8 % (ref 3–12)
Neutro Abs: 1.3 10*3/uL — ABNORMAL LOW (ref 1.7–7.7)
Neutrophils Relative %: 35 % — ABNORMAL LOW (ref 43–77)
Platelets: 287 10*3/uL (ref 150–400)
RBC: 3.77 MIL/uL — ABNORMAL LOW (ref 4.22–5.81)
RDW: 13.4 % (ref 11.5–15.5)
WBC: 3.8 10*3/uL — ABNORMAL LOW (ref 4.0–10.5)

## 2013-07-22 LAB — COMPLETE METABOLIC PANEL WITH GFR
ALBUMIN: 4.5 g/dL (ref 3.5–5.2)
ALT: 14 U/L (ref 0–53)
AST: 26 U/L (ref 0–37)
Alkaline Phosphatase: 50 U/L (ref 39–117)
BILIRUBIN TOTAL: 0.6 mg/dL (ref 0.2–1.2)
BUN: 23 mg/dL (ref 6–23)
CO2: 30 mEq/L (ref 19–32)
Calcium: 9.5 mg/dL (ref 8.4–10.5)
Chloride: 101 mEq/L (ref 96–112)
Creat: 1.27 mg/dL (ref 0.50–1.35)
GFR, EST NON AFRICAN AMERICAN: 65 mL/min
GFR, Est African American: 76 mL/min
GLUCOSE: 84 mg/dL (ref 70–99)
POTASSIUM: 4.3 meq/L (ref 3.5–5.3)
Sodium: 137 mEq/L (ref 135–145)
Total Protein: 7.7 g/dL (ref 6.0–8.3)

## 2013-07-22 MED ORDER — PREGABALIN 75 MG PO CAPS: 75.0000 mg | ORAL_CAPSULE | Freq: Two times a day (BID) | ORAL | Status: DC
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
## 2013-07-23 ENCOUNTER — Telehealth: Payer: Self-pay | Admitting: *Deleted

## 2013-07-23 NOTE — Telephone Encounter (Signed)
Patient came into clinic stating he can not afford the Lyrica, it is not covered under ADAP. Will check with Jasmine December to see if there is patient assistance for this medication. Myrtis Hopping

## 2013-07-24 ENCOUNTER — Telehealth: Payer: Self-pay | Admitting: *Deleted

## 2013-07-24 LAB — HIV-1 RNA QUANT-NO REFLEX-BLD
HIV 1 RNA QUANT: 31 {copies}/mL — AB (ref <20)
HIV-1 RNA Quant, Log: 1.49 {Log} — ABNORMAL HIGH (ref <1.30)

## 2013-07-24 NOTE — Telephone Encounter (Signed)
I tried to call [employer] about patient assistance for Lyrica.  I was unable to contact.  Phone is not working.

## 2013-07-25 LAB — T-HELPER CELL (CD4) - (RCID CLINIC ONLY)
CD4 T CELL HELPER: 38 % (ref 33–55)
CD4 T Cell Abs: 840 /uL (ref 400–2700)

## 2013-07-28 ENCOUNTER — Telehealth: Payer: Self-pay | Admitting: *Deleted

## 2013-07-28 NOTE — Telephone Encounter (Signed)
Patient called and advised his phone is now working and he needs for Pam to give him a call tomorrow so that she can help him with the PAP for him cream. Advised him she is gone for the day but I will let her know and she will call him back tomorrow 07/29/13.

## 2013-07-29 ENCOUNTER — Telehealth: Payer: Self-pay | Admitting: *Deleted

## 2013-07-29 NOTE — Telephone Encounter (Signed)
I was told to contact [name] about his medication.  I left a voice mail asking that he return my call

## 2013-08-18 ENCOUNTER — Encounter (HOSPITAL_COMMUNITY): Payer: Self-pay | Admitting: Emergency Medicine

## 2013-08-18 ENCOUNTER — Emergency Department (INDEPENDENT_AMBULATORY_CARE_PROVIDER_SITE_OTHER)
Admission: EM | Admit: 2013-08-18 | Discharge: 2013-08-18 | Disposition: A | Discharge status: Home / Self Care | Payer: PRIVATE HEALTH INSURANCE | Source: Home / Self Care | Attending: Emergency Medicine | Admitting: Emergency Medicine

## 2013-08-18 DIAGNOSIS — L723 Sebaceous cyst: Secondary | ICD-10-CM

## 2013-08-18 NOTE — Discharge Instructions (Signed)

## 2013-08-18 NOTE — ED Provider Notes (Signed)
CSN: [id number]     Arrival date & time 08/18/13  1418 History   First MD Initiated Contact with Patient 08/18/13 1529     Chief Complaint  Patient presents with  . Cyst   (Consider location/radiation/quality/duration/timing/severity/associated sxs/prior Treatment) Patient is a 53 y.o. male presenting with abscess. The history is provided by the patient. No language interpreter was used.  Abscess Location:  Face Size:  2 Abscess quality: redness and warmth   Abscess quality: not draining   Red streaking: no   Progression:  Worsening Chronicity:  New Context: immunosuppression   Relieved by:  Nothing Worsened by:  Aspirin Ineffective treatments:  None tried hx of a sebaceous cyst.  Has been drained multiple times  Past Medical History  Diagnosis Date  . Murmur, heart   . HIV (human immunodeficiency virus infection)   . Abnormal Pap smear   . Anemia   . Cancer   . Depression   . Neuromuscular disorder   . Substance abuse    Past Surgical History  Procedure Laterality Date  . Anoscopy     History reviewed. No pertinent family history. History  Substance Use Topics  . Smoking status: Never Smoker   . Smokeless tobacco: Never Used  . Alcohol Use: No    Review of Systems  Skin: Positive for wound.    Allergies  Penicillins  Home Medications   Prior to Admission medications   Medication Sig Start Date End Date Taking? Authorizing Provider  Abacavir-Dolutegravir-Lamivud (TRIUMEQ) 600-50-300 MG TABS Take 1 tablet by mouth daily. 01/22/13  Yes Truman Hayward, MD  amitriptyline (ELAVIL) 50 MG tablet Take 1 tablet (50 mg total) by mouth at bedtime. 03/26/13  Yes Truman Hayward, MD  pregabalin (LYRICA) 75 MG capsule Take 1 capsule (75 mg total) by mouth 2 (two) times daily. 07/22/13  Yes Carlyle Basques, MD  acetaminophen-codeine 120-12 MG/5ML solution Take 10 mLs by mouth every 6 (six) hours as needed for moderate pain (and cough). 03/26/13   Truman Hayward,  MD  Guaifenesin 1200 MG TB12 Take 1 tablet (1,200 mg total) by mouth 2 (two) times daily. 03/11/13   Resa Miner Lawyer, PA-C  ibuprofen (ADVIL,MOTRIN) 800 MG tablet Take 1 tablet (800 mg total) by mouth every 8 (eight) hours as needed. 03/11/13   Brent General, PA-C  oseltamivir (TAMIFLU) 75 MG capsule Take 1 capsule (75 mg total) by mouth every 12 (twelve) hours. 03/08/13   Threasa Beards, MD   BP 106/76  Pulse 89  Temp(Src) 98 F (36.7 C) (Oral)  Resp 12  SpO2 99% Physical Exam  Nursing note and vitals reviewed. Constitutional: He is oriented to person, place, and time. He appears well-developed and well-nourished.  HENT:  Head: Normocephalic.  2cm cyst behind left ear  Eyes: EOM are normal.  Neck: Normal range of motion.  Pulmonary/Chest: Effort normal.  Abdominal: He exhibits no distension.  Musculoskeletal: Normal range of motion.  Neurological: He is alert and oriented to person, place, and time.  Psychiatric: He has a normal mood and affect.    ED Course  INCISION AND DRAINAGE Date/Time: 08/18/2013 4:22 PM Performed by: Fransico Meadow Authorized by: Philipp Deputy C Consent: Verbal consent obtained. Risks and benefits: risks, benefits and alternatives were discussed Patient identity confirmed: verbally with patient Time out: Immediately prior to procedure a "time out" was called to verify the correct patient, procedure, equipment, support staff and site/side marked as required. Type: cyst Anesthesia: local  infiltration Local anesthetic: lidocaine 2% without epinephrine Patient sedated: no Scalpel size: 11 Incision type: single straight Complexity: complex Drainage: purulent Drainage amount: copious Wound treatment: wound left open Patient tolerance: Patient tolerated the procedure well with no immediate complications. Comments: Cystic capsule removed in multiple peices   (including critical care time) Labs Review Labs Reviewed - No data to  display  Imaging Review No results found.   MDM   1. Sebaceous cyst of ear       Fransico Meadow, PA-C 08/18/13 1623

## 2013-08-18 NOTE — ED Notes (Signed)
C/o cyst on left ear that comes and goes.  Having mild pain.  Requesting for area to be drained.

## 2013-08-19 NOTE — ED Provider Notes (Signed)
Medical screening examination/treatment/procedure(s) were performed by non-physician practitioner and as supervising physician I was immediately available for consultation/collaboration.  Philipp Deputy, M.D.  Harden Mo, MD 08/19/13 [phone]

## 2013-09-09 ENCOUNTER — Encounter (HOSPITAL_COMMUNITY): Payer: Self-pay | Admitting: Emergency Medicine

## 2013-09-09 ENCOUNTER — Emergency Department (INDEPENDENT_AMBULATORY_CARE_PROVIDER_SITE_OTHER)
Admission: EM | Admit: 2013-09-09 | Discharge: 2013-09-09 | Disposition: A | Discharge status: Home / Self Care | Payer: PRIVATE HEALTH INSURANCE | Source: Home / Self Care | Attending: Family Medicine | Admitting: Family Medicine

## 2013-09-09 DIAGNOSIS — H60399 Other infective otitis externa, unspecified ear: Secondary | ICD-10-CM

## 2013-09-09 DIAGNOSIS — H6002 Abscess of left external ear: Secondary | ICD-10-CM

## 2013-09-09 MED ORDER — MINOCYCLINE HCL 100 MG PO CAPS: 100.0000 mg | ORAL_CAPSULE | Freq: Two times a day (BID) | ORAL | Status: DC

## 2013-09-09 NOTE — Discharge Instructions (Signed)
Warm compress twice a day when you take the antibiotic, take all of medicine, return on fri for packing removal.

## 2013-09-09 NOTE — ED Notes (Signed)
Pt  Has  reoccuring  Cyst  To l  Side  Face         He  States  It was  Drained   About  4  Weeks  Ago  Pt  Reports  Chronic  Shingles  r  Side  Face

## 2013-09-09 NOTE — ED Provider Notes (Addendum)
CSN: [id number]     Arrival date & time 09/09/13  0911 History   First MD Initiated Contact with Patient 09/09/13 [phone]     Chief Complaint  Patient presents with  . Abscess   (Consider location/radiation/quality/duration/timing/severity/associated sxs/prior Treatment) Patient is a 53 y.o. male presenting with abscess. The history is provided by the patient.  Abscess Location:  Face Facial abscess location:  Face Abscess quality: fluctuance, painful and redness   Red streaking: no   Duration:  4 days Progression:  Worsening Pain details:    Severity:  Mild Chronicity:  Recurrent Context comment:  Seen 6/8 for similar, had i+d ,but relapsed.   Past Medical History  Diagnosis Date  . Murmur, heart   . HIV (human immunodeficiency virus infection)   . Abnormal Pap smear   . Anemia   . Cancer   . Depression   . Neuromuscular disorder   . Substance abuse    Past Surgical History  Procedure Laterality Date  . Anoscopy     History reviewed. No pertinent family history. History  Substance Use Topics  . Smoking status: Never Smoker   . Smokeless tobacco: Never Used  . Alcohol Use: No    Review of Systems  Constitutional: Negative.   HENT: Positive for ear pain.     Allergies  Penicillins  Home Medications   Prior to Admission medications   Medication Sig Start Date End Date Taking? Authorizing Provider  Abacavir-Dolutegravir-Lamivud (TRIUMEQ) 600-50-300 MG TABS Take 1 tablet by mouth daily. 01/22/13   Truman Hayward, MD  acetaminophen-codeine 120-12 MG/5ML solution Take 10 mLs by mouth every 6 (six) hours as needed for moderate pain (and cough). 03/26/13   Truman Hayward, MD  amitriptyline (ELAVIL) 50 MG tablet Take 1 tablet (50 mg total) by mouth at bedtime. 03/26/13   Truman Hayward, MD  Guaifenesin 1200 MG TB12 Take 1 tablet (1,200 mg total) by mouth 2 (two) times daily. 03/11/13   Resa Miner Lawyer, PA-C  ibuprofen (ADVIL,MOTRIN) 800 MG tablet  Take 1 tablet (800 mg total) by mouth every 8 (eight) hours as needed. 03/11/13   Resa Miner Lawyer, PA-C  minocycline (MINOCIN,DYNACIN) 100 MG capsule Take 1 capsule (100 mg total) by mouth 2 (two) times daily. 09/09/13   Billy Fischer, MD  oseltamivir (TAMIFLU) 75 MG capsule Take 1 capsule (75 mg total) by mouth every 12 (twelve) hours. 03/08/13   Threasa Beards, MD  pregabalin (LYRICA) 75 MG capsule Take 1 capsule (75 mg total) by mouth 2 (two) times daily. 07/22/13   Carlyle Basques, MD   BP 113/82  Pulse 90  Temp(Src) 97.5 F (36.4 C) (Oral)  Resp 14  SpO2 96% Physical Exam  Nursing note and vitals reviewed. Constitutional: He is oriented to person, place, and time. He appears well-developed and well-nourished.  Neck: Normal range of motion. Neck supple.  Lymphadenopathy:    He has cervical adenopathy.  Neurological: He is alert and oriented to person, place, and time.  Skin: Skin is warm and dry. There is erythema.  Fluctuant cyst lower pole of left pinna    ED Course  INCISION AND DRAINAGE Date/Time: 09/09/2013 10:10 AM Performed by: Billy Fischer Authorized by: Ihor Gully D Consent: Verbal consent obtained. Risks and benefits: risks, benefits and alternatives were discussed Consent given by: patient Type: abscess Body area: head/neck Location details: left external ear Local anesthetic: topical anesthetic Patient sedated: no Scalpel size: 11 Incision type: single straight  Complexity: simple Drainage: purulent Drainage amount: moderate Wound treatment: wound left open Packing material: 1/4 in iodoform gauze Patient tolerance: Patient tolerated the procedure well with no immediate complications. Comments: Culture obtained.   (including critical care time) Labs Review Labs Reviewed  CULTURE, ROUTINE-ABSCESS  CULTURE, ROUTINE-ABSCESS    Imaging Review No results found.   MDM   1. Abscess of earlobe, left        Billy Fischer, MD 09/09/13  [place], MD 09/09/13 [phone]

## 2013-09-10 ENCOUNTER — Other Ambulatory Visit: Payer: PRIVATE HEALTH INSURANCE

## 2013-09-12 LAB — CULTURE, ROUTINE-ABSCESS

## 2013-09-15 NOTE — ED Notes (Addendum)
Abscess culture L ear: Few Diphtheroids (Corynebacterium species)-no sensitivity.  Pt. treated with Minocycline.  Lab shown to Dr. Juventino Slovak and he said that was good. Roselyn Meier 09/15/2013

## 2013-09-24 ENCOUNTER — Ambulatory Visit: Payer: PRIVATE HEALTH INSURANCE | Admitting: Infectious Disease

## 2013-09-30 ENCOUNTER — Ambulatory Visit: Payer: PRIVATE HEALTH INSURANCE | Admitting: Infectious Disease

## 2013-10-01 ENCOUNTER — Other Ambulatory Visit: Payer: Self-pay | Admitting: *Deleted

## 2013-10-01 ENCOUNTER — Telehealth: Payer: Self-pay | Admitting: *Deleted

## 2013-10-01 DIAGNOSIS — B0229 Other postherpetic nervous system involvement: Secondary | ICD-10-CM

## 2013-10-01 MED ORDER — PREGABALIN 75 MG PO CAPS: 75.0000 mg | ORAL_CAPSULE | Freq: Two times a day (BID) | ORAL | Status: DC

## 2013-10-01 NOTE — Telephone Encounter (Signed)
Faxed application to [employer] for Lyrica today.

## 2013-10-01 NOTE — Telephone Encounter (Signed)
PAP assistance form.

## 2013-10-15 ENCOUNTER — Ambulatory Visit (INDEPENDENT_AMBULATORY_CARE_PROVIDER_SITE_OTHER): Payer: Self-pay | Admitting: Infectious Disease

## 2013-10-15 ENCOUNTER — Ambulatory Visit: Payer: PRIVATE HEALTH INSURANCE

## 2013-10-15 ENCOUNTER — Encounter: Payer: Self-pay | Admitting: Infectious Disease

## 2013-10-15 VITALS — BP 106/71 | HR 85 | Temp 97.5°F | Wt 137.0 lb

## 2013-10-15 DIAGNOSIS — R0781 Pleurodynia: Secondary | ICD-10-CM

## 2013-10-15 DIAGNOSIS — R079 Chest pain, unspecified: Secondary | ICD-10-CM

## 2013-10-15 DIAGNOSIS — I10 Essential (primary) hypertension: Secondary | ICD-10-CM

## 2013-10-15 DIAGNOSIS — F1021 Alcohol dependence, in remission: Secondary | ICD-10-CM

## 2013-10-15 DIAGNOSIS — T50905A Adverse effect of unspecified drugs, medicaments and biological substances, initial encounter: Secondary | ICD-10-CM

## 2013-10-15 DIAGNOSIS — B20 Human immunodeficiency virus [HIV] disease: Secondary | ICD-10-CM

## 2013-10-15 NOTE — Progress Notes (Signed)
Subjective:    Patient ID: Arthur Lynch, male    DOB: 23-Jun-1960, 53 y.o.   MRN: [id number]  HPI   Arthur Lynch is a 53 y.o. male who has  been doing superbly well on his  antiviral regimen, of isentress and truvada whom we changde to Triumeq and he was agreeable to this. He continued with undetectable viral load and healthy CD4 count on this regimen.  Since I last saw him he was fired from his job. Apparently they had thought he was drunk. THe pt states that he took one of his Elavil's during the day and that this (understandably made him very sleepy and made him appear intoxicated). He stated that he was NOT offered testing for etoh and drugs but was simply fired with presumption that he was drunk due to etoh.   He is now unemployed and we have just filled out his ADAP and RW paperwork. He also hurt his left ribs when he turned yesterday suddenly.   Review of Systems  Constitutional: Positive for fatigue. Negative for activity change, appetite change and unexpected weight change.  HENT: Negative for congestion, rhinorrhea, sinus pressure, sneezing, sore throat and trouble swallowing.   Eyes: Negative for photophobia and visual disturbance.  Respiratory: Negative for cough, chest tightness, shortness of breath, wheezing and stridor.   Cardiovascular: Negative for chest pain, palpitations and leg swelling.  Gastrointestinal: Positive for abdominal pain. Negative for nausea, vomiting, diarrhea, constipation, blood in stool, abdominal distention and anal bleeding.  Genitourinary: Negative for dysuria, hematuria, flank pain and difficulty urinating.  Musculoskeletal: Negative for arthralgias, back pain, gait problem, joint swelling and myalgias.  Skin: Negative for color change, pallor, rash and wound.  Neurological: Negative for dizziness, tremors, weakness and light-headedness.  Hematological: Negative for adenopathy. Does not bruise/bleed easily.  Psychiatric/Behavioral: Positive  for dysphoric mood. Negative for behavioral problems, confusion, decreased concentration and agitation.       Objective:   Physical Exam  Constitutional: He is oriented to person, place, and time. He appears well-developed and well-nourished. No distress.  HENT:  Head: Normocephalic and atraumatic.  Mouth/Throat: Oropharynx is clear and moist. No oropharyngeal exudate.  Eyes: Conjunctivae and EOM are normal. Pupils are equal, round, and reactive to light. No scleral icterus.  Neck: Normal range of motion. Neck supple. No JVD present.  Cardiovascular: Normal rate, regular rhythm and normal heart sounds.  Exam reveals no gallop and no friction rub.   No murmur heard. Pulmonary/Chest: Effort normal and breath sounds normal. No respiratory distress. He has no wheezes. He exhibits tenderness.    Abdominal: Soft. He exhibits no distension and no mass. There is no tenderness. There is no rebound and no guarding.  Musculoskeletal: He exhibits no edema and no tenderness.  Lymphadenopathy:    He has no cervical adenopathy.  Neurological: He is alert and oriented to person, place, and time. He has normal reflexes. He exhibits normal muscle tone. Coordination normal.  Skin: Skin is warm and dry. He is not diaphoretic. No erythema. No pallor.     Psychiatric: He has a normal mood and affect. His behavior is normal. Judgment and thought content normal. His speech is delayed.       Assessment & Plan:  HIV: Doing very well on [place] . ADAP applied. He has at least 2 weeks left of these meds and hopefully there will be no lapse in ARVs coming from ADAP   I spent greater than 40 minutes with the patient including greater  than 50% of time in face to face counsel of the patient and in coordination of their care.   Alcoholism: Has relapsed once prior to one of my last visit s with him. He was accused of being intoxicated at work and fired but he states that he had taken elavil during the day that day.  I have documented what patient told me happened. I think he should have been offered etoh and drug testing to prove or disprove he was intoxicated with drugs or etoh and that if not he may have been fired without just cause. I have advised he go seek councel from [facility] I spent greater than 25 minutes with the patient including greater than 50% of time in face to face counsel of the patient and in coordination of their care.   depression : Going to amitriptyline at bedtime   Neuralgia:continue amitriptyline  Rib pain; check rib films. Use ibuprofen

## 2013-10-21 ENCOUNTER — Other Ambulatory Visit: Payer: Self-pay | Admitting: *Deleted

## 2013-10-21 MED ORDER — ABACAVIR-DOLUTEGRAVIR-LAMIVUD 600-50-300 MG PO TABS: 1.0000 | ORAL_TABLET | Freq: Every day | ORAL | Status: DC

## 2013-11-03 ENCOUNTER — Telehealth: Payer: Self-pay | Admitting: *Deleted

## 2013-11-03 NOTE — Telephone Encounter (Signed)
Called [facility].  Application denied.  Re-faxed with corrected information.

## 2013-12-08 ENCOUNTER — Telehealth: Payer: Self-pay | Admitting: *Deleted

## 2013-12-08 NOTE — Telephone Encounter (Signed)
Patient wanted to know if he could take the Lyrica more than twice daily to help with lingering shingles pain. Advised him to only take it as directed twice daily considering that he is also taking elavil at bedtime. Arthur Lynch

## 2013-12-08 NOTE — Telephone Encounter (Signed)
Correction 10:30 AM

## 2013-12-08 NOTE — Telephone Encounter (Signed)
Left a message for patient to call the clinic regarding the HRA clinic with Dr. Johnnye Sima for 12/19/13 10:00 AM. If patient calls back please let me know if he is interested in coming to this clinic appointment. Myrtis Hopping

## 2013-12-11 ENCOUNTER — Emergency Department (HOSPITAL_COMMUNITY): Payer: Medicare (Managed Care)

## 2013-12-11 ENCOUNTER — Emergency Department (HOSPITAL_COMMUNITY)
Admission: EM | Admit: 2013-12-11 | Discharge: 2013-12-11 | Disposition: A | Discharge status: Home / Self Care | Payer: Medicare (Managed Care) | Attending: Emergency Medicine | Admitting: Emergency Medicine

## 2013-12-11 ENCOUNTER — Encounter (HOSPITAL_COMMUNITY): Payer: Self-pay | Admitting: Emergency Medicine

## 2013-12-11 DIAGNOSIS — Y9389 Activity, other specified: Secondary | ICD-10-CM | POA: Insufficient documentation

## 2013-12-11 DIAGNOSIS — R011 Cardiac murmur, unspecified: Secondary | ICD-10-CM | POA: Diagnosis not present

## 2013-12-11 DIAGNOSIS — Z859 Personal history of malignant neoplasm, unspecified: Secondary | ICD-10-CM | POA: Diagnosis not present

## 2013-12-11 DIAGNOSIS — J449 Chronic obstructive pulmonary disease, unspecified: Secondary | ICD-10-CM | POA: Insufficient documentation

## 2013-12-11 DIAGNOSIS — Z862 Personal history of diseases of the blood and blood-forming organs and certain disorders involving the immune mechanism: Secondary | ICD-10-CM | POA: Insufficient documentation

## 2013-12-11 DIAGNOSIS — R079 Chest pain, unspecified: Secondary | ICD-10-CM | POA: Diagnosis present

## 2013-12-11 DIAGNOSIS — F329 Major depressive disorder, single episode, unspecified: Secondary | ICD-10-CM | POA: Insufficient documentation

## 2013-12-11 DIAGNOSIS — Z880 Allergy status to penicillin: Secondary | ICD-10-CM | POA: Diagnosis not present

## 2013-12-11 DIAGNOSIS — R0789 Other chest pain: Secondary | ICD-10-CM | POA: Diagnosis not present

## 2013-12-11 DIAGNOSIS — Z79899 Other long term (current) drug therapy: Secondary | ICD-10-CM | POA: Insufficient documentation

## 2013-12-11 DIAGNOSIS — Z21 Asymptomatic human immunodeficiency virus [HIV] infection status: Secondary | ICD-10-CM | POA: Diagnosis not present

## 2013-12-11 DIAGNOSIS — M7022 Olecranon bursitis, left elbow: Secondary | ICD-10-CM | POA: Diagnosis not present

## 2013-12-11 HISTORY — DX: Chronic obstructive pulmonary disease, unspecified: J44.9

## 2013-12-11 LAB — I-STAT TROPONIN, ED: TROPONIN I, POC: 0 ng/mL (ref 0.00–0.08)

## 2013-12-11 LAB — BASIC METABOLIC PANEL
Anion gap: 9 (ref 5–15)
BUN: 19 mg/dL (ref 6–23)
CHLORIDE: 101 meq/L (ref 96–112)
CO2: 29 mEq/L (ref 19–32)
Calcium: 9.2 mg/dL (ref 8.4–10.5)
Creatinine, Ser: 1.28 mg/dL (ref 0.50–1.35)
GFR calc Af Amer: 73 mL/min — ABNORMAL LOW (ref >90)
GFR, EST NON AFRICAN AMERICAN: 63 mL/min — AB (ref >90)
GLUCOSE: 78 mg/dL (ref 70–99)
POTASSIUM: 4.3 meq/L (ref 3.7–5.3)
SODIUM: 139 meq/L (ref 137–147)

## 2013-12-11 LAB — CBC
HEMATOCRIT: 36.2 % — AB (ref 39.0–52.0)
HEMOGLOBIN: 12.5 g/dL — AB (ref 13.0–17.0)
MCH: 35.1 pg — ABNORMAL HIGH (ref 26.0–34.0)
MCHC: 34.5 g/dL (ref 30.0–36.0)
MCV: 101.7 fL — ABNORMAL HIGH (ref 78.0–100.0)
Platelets: 248 10*3/uL (ref 150–400)
RBC: 3.56 MIL/uL — ABNORMAL LOW (ref 4.22–5.81)
RDW: 12.6 % (ref 11.5–15.5)
WBC: 3.8 10*3/uL — ABNORMAL LOW (ref 4.0–10.5)

## 2013-12-11 MED ORDER — IBUPROFEN 800 MG PO TABS: 800.0000 mg | ORAL_TABLET | Freq: Three times a day (TID) | ORAL | Status: DC | PRN

## 2013-12-11 NOTE — Discharge Instructions (Signed)
Chest Wall Pain Chest wall pain is pain in or around the bones and muscles of your chest. It may take up to 6 weeks to get better. It may take longer if you must stay physically active in your work and activities.  CAUSES  Chest wall pain may happen on its own. However, it may be caused by:  A viral illness like the flu.  Injury.  Coughing.  Exercise.  Arthritis.  Fibromyalgia.  Shingles. HOME CARE INSTRUCTIONS   Avoid overtiring physical activity. Try not to strain or perform activities that cause pain. This includes any activities using your chest or your abdominal and side muscles, especially if heavy weights are used.  Put ice on the sore area.  Put ice in a plastic bag.  Place a towel between your skin and the bag.  Leave the ice on for 15-20 minutes per hour while awake for the first 2 days.  Only take over-the-counter or prescription medicines for pain, discomfort, or fever as directed by your caregiver. SEEK IMMEDIATE MEDICAL CARE IF:   Your pain increases, or you are very uncomfortable.  You have a fever.  Your chest pain becomes worse.  You have new, unexplained symptoms.  You have nausea or vomiting.  You feel sweaty or lightheaded.  You have a cough with phlegm (sputum), or you cough up blood. MAKE SURE YOU:   Understand these instructions.  Will watch your condition.  Will get help right away if you are not doing well or get worse. Document Released: 02/27/2005 Document Revised: 05/22/2011 Document Reviewed: 10/24/2010 [facility] Patient Information 2015 [place], [place]. This information is not intended to replace advice given to you by your health care provider. Make sure you discuss any questions you have with your health care provider.  Olecranon Bursitis Bursitis is swelling and soreness (inflammation) of a fluid-filled sac (bursa) that covers and protects a joint. Olecranon bursitis occurs over the elbow.  CAUSES Bursitis can be caused by  injury, overuse of the joint, arthritis, or infection.  SYMPTOMS   Tenderness, swelling, warmth, or redness over the elbow.  Elbow pain with movement. This is greater with bending the elbow.  Squeaking sound when the bursa is rubbed or moved.  Increasing size of the bursa without pain or discomfort.  Fever with increasing pain and swelling if the bursa becomes infected. HOME CARE INSTRUCTIONS   Put ice on the affected area.  Put ice in a plastic bag.  Place a towel between your skin and the bag.  Leave the ice on for 15-20 minutes each hour while awake. Do this for the first 2 days.  When resting, elevate your elbow above the level of your heart. This helps reduce swelling.  Continue to put the joint through a full range of motion 4 times per day. Rest the injured joint at other times. When the pain lessens, begin normal slow movements and usual activities.  Only take over-the-counter or prescription medicines for pain, discomfort, or fever as directed by your caregiver.  Reduce your intake of milk and related dairy products (cheese, yogurt). They may make your condition worse. SEEK IMMEDIATE MEDICAL CARE IF:   Your pain increases even during treatment.  You have a fever.  You have heat and inflammation over the bursa and elbow.  You have a red line that goes up your arm.  You have pain with movement of your elbow. MAKE SURE YOU:   Understand these instructions.  Will watch your condition.  Will get help  right away if you are not doing well or get worse. Document Released: 03/29/2006 Document Revised: 05/22/2011 Document Reviewed: 02/12/2007 [facility] Patient Information 2015 [place], [place]. This information is not intended to replace advice given to you by your health care provider. Make sure you discuss any questions you have with your health care provider.

## 2013-12-11 NOTE — ED Provider Notes (Signed)
TIME SEEN: 11:15 PM  CHIEF COMPLAINT: Chest pain, left elbow swelling  HPI: Patient is a 53 y.o. M with history of HIV currently on Triumeq (last viral load of 31 and CD4 at 840 on 07/22/13) was followed by Dr. Lucianne Lei dam with infectious disease, COPD, history of substance abuse who presents to the emergency department with complaints of left-sided chest pain that he describes as sharp. Pain is only present whenever he turns his head to the left and moves his left arm. It is not present at rest. It is not exertional or pleuritic. There is no associated shortness of breath, diaphoresis, nausea or vomiting or dizziness. No history of coronary artery disease. No history of PE or DVT. No lower extremity swelling or pain. No recent prolonged immobilization, fracture, surgery, trauma or tobacco use. No fevers or cough.   He is also no swelling to his left elbow. There is no pain in this area and he is able to move the elbow without difficulty. No history of injury. No numbness, tingling or focal weakness. No erythema or warmth. He is left-hand dominant.  He has not missed any doses of his antiviral treatment.  ROS: See HPI Constitutional: no fever  Eyes: no drainage  ENT: no runny nose   Cardiovascular:  no chest pain  Resp: no SOB  GI: no vomiting GU: no dysuria Integumentary: no rash  Allergy: no hives  Musculoskeletal: no leg swelling  Neurological: no slurred speech ROS otherwise negative  PAST MEDICAL HISTORY/PAST SURGICAL HISTORY:  Past Medical History  Diagnosis Date  . Murmur, heart   . HIV (human immunodeficiency virus infection)   . Abnormal Pap smear   . Anemia   . Cancer   . Depression   . Neuromuscular disorder   . Substance abuse   . COPD (chronic obstructive pulmonary disease)     MEDICATIONS:  Prior to Admission medications   Medication Sig Start Date End Date Taking? Authorizing Provider  Abacavir-Dolutegravir-Lamivud (TRIUMEQ) 600-50-300 MG TABS Take 1 tablet by  mouth daily. 10/21/13  Yes Thayer Headings, MD  amitriptyline (ELAVIL) 50 MG tablet Take 50 mg by mouth at bedtime.   Yes Historical Provider, MD  pregabalin (LYRICA) 75 MG capsule Take 1 capsule (75 mg total) by mouth 2 (two) times daily. 10/01/13  Yes Carlyle Basques, MD    ALLERGIES:  Allergies  Allergen Reactions  . Penicillins Itching    SOCIAL HISTORY:  History  Substance Use Topics  . Smoking status: Never Smoker   . Smokeless tobacco: Never Used  . Alcohol Use: No    FAMILY HISTORY: No family history on file.  EXAM: BP 112/77  Pulse 80  Temp(Src) 98 F (36.7 C) (Oral)  Resp 16  SpO2 100% CONSTITUTIONAL: Alert and oriented and responds appropriately to questions. Well-appearing; well-nourished, pleasant, smiling, no distress, nontoxic  HEAD: Normocephalic EYES: Conjunctivae clear, PERRL ENT: normal nose; no rhinorrhea; moist mucous membranes; pharynx without lesions noted NECK: Supple, no meningismus, no LAD  CARD: RRR; S1 and S2 appreciated; no murmurs, no clicks, no rubs, no gallops RESP: Normal chest excursion without splinting or tachypnea; breath sounds clear and equal bilaterally; no wheezes, no rhonchi, no rales, left chest wall is mildly tender to palpation without ecchymosis or deformity or crepitus, no chest, speaking full sentences, no hypoxia ABD/GI: Normal bowel sounds; non-distended; soft, non-tender, no rebound, no guarding BACK:  The back appears normal and is non-tender to palpation, there is no CVA tenderness EXT: Patient's left olecranon bursa  is enlarged but not erythematous or warm, there is no induration, full range of motion in the left elbow without pain, 2+ radial pulses bilaterally, normal grip strength bilaterally, sensation to light touch intact diffusely, no calf tenderness or swelling, Normal ROM in all joints; extremities are all non-tender to palpation; no edema; normal capillary refill; no cyanosis    SKIN: Normal color for age and race;  warm NEURO: Moves all extremities equally PSYCH: The patient's mood and manner are appropriate. Grooming and personal hygiene are appropriate.  MEDICAL DECISION MAKING: Patient here with chest wall pain. He has no risk factors for ACS. EKG shows no ischemic changes.  He has no risk factor for pulmonary embolus. Chest x-ray shows no infiltrate. He is immunocompromised. Pain is only reproducible with palpation of his chest wall and when he moves his left arm and turns his head to the left. Discussed with him that he can use ibuprofen for pain. Discussed return precautions.   Patient also seems to have swelling of his left olecranon bursa. There is no sign of septic bursitis or septic arthritis. He is neurovascular intact distally. No history of injury. He has absolutely no pain on exam and states he only noticed it because he felt something on it when he used his elbow to push himself out of bed. Discussed with patient that he began having difficulty moving his left arm, had redness or warmth in this area, fever, he should be reevaluated. He verbalizes understanding is comfortable with plan for discharge home.          Date: 12/11/2013 20:48  Rate: 85  Rhythm: normal sinus rhythm  QRS Axis: normal  Intervals: normal  ST/T Wave abnormalities: normal  Conduction Disutrbances: none  Narrative Interpretation: unremarkable; early repolarization, no reciprocal changes, no significant change compared to prior EKG      [place], DO 12/11/13 2339

## 2013-12-11 NOTE — ED Notes (Signed)
Pt reports intermittent left sided chest pain described as piercing. Worse when he turns head to left and movement of left arm. Denies SPB, N/V, diaphoresis or dizziness. Pt also reports boil to left elbow that he noticed this evening. Denies pain to area. Hx: HIV. NAD.

## 2013-12-18 ENCOUNTER — Telehealth: Payer: Self-pay | Admitting: *Deleted

## 2013-12-18 NOTE — Telephone Encounter (Signed)
Patient called with questions regarding his Lyrica refill.  Pam, please advise. Landis Gandy, RN

## 2013-12-19 ENCOUNTER — Other Ambulatory Visit: Payer: Self-pay | Admitting: Infectious Diseases

## 2013-12-19 ENCOUNTER — Encounter: Payer: Self-pay | Admitting: Infectious Diseases

## 2013-12-19 ENCOUNTER — Ambulatory Visit (INDEPENDENT_AMBULATORY_CARE_PROVIDER_SITE_OTHER): Payer: Self-pay | Admitting: Infectious Diseases

## 2013-12-19 VITALS — BP 109/67 | HR 102 | Temp 98.3°F

## 2013-12-19 DIAGNOSIS — Z23 Encounter for immunization: Secondary | ICD-10-CM

## 2013-12-19 DIAGNOSIS — R896 Abnormal cytological findings in specimens from other organs, systems and tissues: Secondary | ICD-10-CM

## 2013-12-19 DIAGNOSIS — B20 Human immunodeficiency virus [HIV] disease: Secondary | ICD-10-CM

## 2013-12-19 DIAGNOSIS — R85610 Atypical squamous cells of undetermined significance on cytologic smear of anus (ASC-US): Secondary | ICD-10-CM

## 2013-12-19 NOTE — Telephone Encounter (Signed)
Patient came in today for HRA clinic and was concerned about not getting his Lyrica. I called [facility] and he has to call every month and reorder, I ordered it this time and the patient will order going forward. I gave the patient the number [phone] he will need to put in the RX#

## 2013-12-19 NOTE — Progress Notes (Signed)
Patient ID: Arthur Lynch, male   DOB: Feb 15, 1961, 53 y.o.   MRN: [id number] 53 yo M with hx of HIV+, on triumeq. Was seen in Ed recently for bursitis of his L elbow. Has since had some erythema on his L forearm.  Has previous ASCU on anal pap 07-2012. He underwent HRA 8-14 that showed AIN1.  He comes to clinic today for repeat HRA.   HIV 1 RNA Quant (copies/mL)  Date Value  07/22/2013 31*  02/26/2013 <20   12/05/2012 <20      CD4 T Cell Abs (/uL)  Date Value  07/22/2013 840   02/26/2013 540   12/05/2012 670    Filed Vitals:   12/19/13 1022  BP: 89/67  Pulse: 102  Temp: 98.3 F (36.8 C)   BP repeated 109/69 His R elbow is non-inflamed, no tenderness.  There is mild erythema, / bruising proximally.     PRE-OPERATIVE DIAGNOSIS:  Anal condyloma, HRA with bx  POST-OPERATIVE DIAGNOSIS:  Anal condyloma, HRA with bx  PROCEDURE:    SURGEON:  Creedence Heiss  ASSISTANT: [place]  ANESTHESIA:   local  EBL: < 10 cc   SPECIMEN:  Source of Specimen:  anterior anal canal  DISPOSITION OF SPECIMEN:  PATHOLOGY  COUNTS:  YES  PLAN OF CARE: home  PATIENT DISPOSITION:  home  INDICATION:  Prev AIN1   OR FINDINGS: large area of aceto-white on antero-lateral anal canal  DESCRIPTION: The patient was identified in the waiting area and taken to the exam room where they were laid on the table in the lateral decubitus position.  The patient was then prepped and draped in the usual fashion. A surgical timeout was performed indicating the correct patient, procedure, positioning and preoperative antibioitics.   After this was completed, a sponge was soaked in 2% acetic acid was placed over the perianal region. This was allowed to soak for 1 minute. The sponge was removed and the perianal region was evaluated with a colposcope.  There were no external lesions.  The internal anal canal was evaluated via anoscopy with an anoscope.  There was a large area of acetowhite on the antero-lateral anal  canal.  A bx as sent from the edge of thi. After this was completed, hemostasis was achieved with gauze and 3 silver nitrate sticks.    PLAN:   Will await path results, will call pt with results.  Pt to home Pt given instructions on bleeding, to call me if any bleeding. He is cautioned that there may be some bleeding with his next BM but if this persists, he should call me.  Flu shot today

## 2013-12-24 ENCOUNTER — Other Ambulatory Visit: Payer: PRIVATE HEALTH INSURANCE

## 2013-12-29 ENCOUNTER — Telehealth: Payer: Self-pay | Admitting: *Deleted

## 2013-12-29 NOTE — Telephone Encounter (Signed)
Called patient and notified him per Dr. Johnnye Sima that the anal biopsy was negative from the HRA procedure that he had done.

## 2014-01-07 ENCOUNTER — Ambulatory Visit: Payer: PRIVATE HEALTH INSURANCE | Admitting: Infectious Disease

## 2014-01-09 ENCOUNTER — Ambulatory Visit (INDEPENDENT_AMBULATORY_CARE_PROVIDER_SITE_OTHER): Payer: PRIVATE HEALTH INSURANCE | Admitting: Infectious Disease

## 2014-01-09 ENCOUNTER — Encounter: Payer: Self-pay | Admitting: Infectious Disease

## 2014-01-09 VITALS — BP 110/77 | HR 83 | Temp 98.2°F | Ht 73.5 in | Wt 143.0 lb

## 2014-01-09 DIAGNOSIS — F32A Depression, unspecified: Secondary | ICD-10-CM

## 2014-01-09 DIAGNOSIS — F329 Major depressive disorder, single episode, unspecified: Secondary | ICD-10-CM

## 2014-01-09 DIAGNOSIS — B20 Human immunodeficiency virus [HIV] disease: Secondary | ICD-10-CM

## 2014-01-09 DIAGNOSIS — G4700 Insomnia, unspecified: Secondary | ICD-10-CM

## 2014-01-09 DIAGNOSIS — F1020 Alcohol dependence, uncomplicated: Secondary | ICD-10-CM

## 2014-01-09 DIAGNOSIS — Z113 Encounter for screening for infections with a predominantly sexual mode of transmission: Secondary | ICD-10-CM

## 2014-01-09 DIAGNOSIS — M7022 Olecranon bursitis, left elbow: Secondary | ICD-10-CM

## 2014-01-09 LAB — T-HELPER CELL (CD4) - (RCID CLINIC ONLY)
CD4 T CELL HELPER: 36 % (ref 33–55)
CD4 T Cell Abs: 560 /uL (ref 400–2700)

## 2014-01-09 NOTE — Patient Instructions (Signed)
Take ibuprofen THREE 200mg  tablets four times a day for your elbow swelling  And protect the elbow

## 2014-01-09 NOTE — Progress Notes (Signed)
  Subjective:    Patient ID: Arthur Lynch, male    DOB: 1960-07-14, 53 y.o.   MRN: [id number]  HPI   Arthur Lynch is a 53 y.o. male who has  been doing superbly well on his  antiviral regimen, of isentress and truvada whom we changde to Triumeq and he was agreeable to this. He continued with essentially undetectable viral load and healthy CD4 count on this regimen.  Lab Results  Component Value Date   HIV1RNAQUANT 31* 07/22/2013   Lab Results  Component Value Date   CD4TABS 840 07/22/2013   CD4TABS 540 02/26/2013   CD4TABS 670 12/05/2012    He was seen in the ER recently with some chest discomfort as well as bursitis of his left olecranon. This initially improved but now has recurred. No fevers or systemic symptoms his chest is infected bursitis   Review of Systems  Constitutional: Negative for activity change, appetite change and unexpected weight change.  HENT: Negative for congestion, rhinorrhea, sinus pressure, sneezing, sore throat and trouble swallowing.   Eyes: Negative for photophobia and visual disturbance.  Respiratory: Negative for cough, chest tightness, shortness of breath, wheezing and stridor.   Cardiovascular: Negative for chest pain, palpitations and leg swelling.  Gastrointestinal: Negative for nausea, vomiting, diarrhea, constipation, blood in stool, abdominal distention and anal bleeding.  Genitourinary: Negative for dysuria, hematuria, flank pain and difficulty urinating.  Musculoskeletal: Negative for arthralgias, back pain, gait problem, joint swelling and myalgias.  Skin: Negative for color change, pallor, rash and wound.  Neurological: Negative for dizziness, tremors, weakness and light-headedness.  Hematological: Negative for adenopathy. Does not bruise/bleed easily.  Psychiatric/Behavioral: Negative for behavioral problems, confusion, decreased concentration and agitation.       Objective:   Physical Exam  Constitutional: He is oriented to  person, place, and time. He appears well-developed and well-nourished. No distress.  HENT:  Head: Normocephalic and atraumatic.  Mouth/Throat: Oropharynx is clear and moist. No oropharyngeal exudate.  Eyes: Conjunctivae and EOM are normal. Pupils are equal, round, and reactive to light. No scleral icterus.  Neck: Normal range of motion. Neck supple. No JVD present.  Cardiovascular: Normal rate, regular rhythm and normal heart sounds.  Exam reveals no gallop and no friction rub.   No murmur heard. Pulmonary/Chest: Effort normal and breath sounds normal. No respiratory distress. He has no wheezes.  Abdominal: Soft. He exhibits no distension and no mass. There is no tenderness. There is no rebound and no guarding.  Musculoskeletal: He exhibits no edema and no tenderness.       Left elbow: He exhibits swelling.  Lymphadenopathy:    He has no cervical adenopathy.  Neurological: He is alert and oriented to person, place, and time. He has normal reflexes. He exhibits normal muscle tone. Coordination normal.  Skin: Skin is warm and dry. He is not diaphoretic. No erythema. No pallor.  Psychiatric: He has a normal mood and affect. His behavior is normal. Judgment and thought content normal. His speech is delayed.   Left olecranon bursitis:         Assessment & Plan:  HIV: Doing very well on TRIUMEQ . ADAP enrolled    Alcoholism:  Hopefully sober  depression : Going to amitriptyline at bedtime  Neuralgia:continue amitriptyline  Olecranon bursitis: protect area, NSAIDS

## 2014-01-10 LAB — COMPLETE METABOLIC PANEL WITH GFR
ALT: 15 U/L (ref 0–53)
AST: 26 U/L (ref 0–37)
Albumin: 4.3 g/dL (ref 3.5–5.2)
Alkaline Phosphatase: 46 U/L (ref 39–117)
BUN: 18 mg/dL (ref 6–23)
CALCIUM: 9.4 mg/dL (ref 8.4–10.5)
CHLORIDE: 103 meq/L (ref 96–112)
CO2: 29 meq/L (ref 19–32)
Creat: 1.17 mg/dL (ref 0.50–1.35)
GFR, EST AFRICAN AMERICAN: 83 mL/min
GFR, Est Non African American: 72 mL/min
GLUCOSE: 73 mg/dL (ref 70–99)
Potassium: 4.4 mEq/L (ref 3.5–5.3)
SODIUM: 139 meq/L (ref 135–145)
TOTAL PROTEIN: 7.3 g/dL (ref 6.0–8.3)
Total Bilirubin: 0.6 mg/dL (ref 0.2–1.2)

## 2014-01-10 LAB — CBC WITH DIFFERENTIAL/PLATELET
BASOS PCT: 0 % (ref 0–1)
Basophils Absolute: 0 10*3/uL (ref 0.0–0.1)
Eosinophils Absolute: 0 10*3/uL (ref 0.0–0.7)
Eosinophils Relative: 1 % (ref 0–5)
HCT: 36 % — ABNORMAL LOW (ref 39.0–52.0)
Hemoglobin: 12.4 g/dL — ABNORMAL LOW (ref 13.0–17.0)
Lymphocytes Relative: 56 % — ABNORMAL HIGH (ref 12–46)
Lymphs Abs: 1.5 10*3/uL (ref 0.7–4.0)
MCH: 34.7 pg — AB (ref 26.0–34.0)
MCHC: 34.4 g/dL (ref 30.0–36.0)
MCV: 100.8 fL — ABNORMAL HIGH (ref 78.0–100.0)
Monocytes Absolute: 0.2 10*3/uL (ref 0.1–1.0)
Monocytes Relative: 9 % (ref 3–12)
NEUTROS PCT: 34 % — AB (ref 43–77)
Neutro Abs: 0.9 10*3/uL — ABNORMAL LOW (ref 1.7–7.7)
PLATELETS: 274 10*3/uL (ref 150–400)
RBC: 3.57 MIL/uL — ABNORMAL LOW (ref 4.22–5.81)
RDW: 13.6 % (ref 11.5–15.5)
WBC: 2.7 10*3/uL — ABNORMAL LOW (ref 4.0–10.5)

## 2014-01-10 LAB — RPR

## 2014-01-12 LAB — URINE CYTOLOGY ANCILLARY ONLY
CHLAMYDIA, DNA PROBE: NEGATIVE
Neisseria Gonorrhea: NEGATIVE

## 2014-01-13 LAB — HIV-1 RNA QUANT-NO REFLEX-BLD: HIV-1 RNA Quant, Log: <1.3 {Log} (ref <1.30)

## 2014-01-16 LAB — HIV-1 INTEGRASE GENOTYPE

## 2014-02-11 ENCOUNTER — Ambulatory Visit: Payer: Self-pay

## 2014-02-11 ENCOUNTER — Other Ambulatory Visit: Payer: Self-pay | Admitting: Infectious Disease

## 2014-02-11 DIAGNOSIS — B20 Human immunodeficiency virus [HIV] disease: Secondary | ICD-10-CM

## 2014-02-26 ENCOUNTER — Ambulatory Visit: Payer: Self-pay

## 2014-03-18 ENCOUNTER — Ambulatory Visit: Payer: Medicare (Managed Care)

## 2014-03-23 ENCOUNTER — Other Ambulatory Visit: Payer: Self-pay

## 2014-03-24 ENCOUNTER — Other Ambulatory Visit (INDEPENDENT_AMBULATORY_CARE_PROVIDER_SITE_OTHER): Payer: Self-pay

## 2014-03-24 DIAGNOSIS — B20 Human immunodeficiency virus [HIV] disease: Secondary | ICD-10-CM

## 2014-03-24 DIAGNOSIS — F1020 Alcohol dependence, uncomplicated: Secondary | ICD-10-CM

## 2014-03-24 DIAGNOSIS — F329 Major depressive disorder, single episode, unspecified: Secondary | ICD-10-CM

## 2014-03-24 DIAGNOSIS — M7022 Olecranon bursitis, left elbow: Secondary | ICD-10-CM

## 2014-03-24 DIAGNOSIS — Z113 Encounter for screening for infections with a predominantly sexual mode of transmission: Secondary | ICD-10-CM

## 2014-03-24 DIAGNOSIS — F32A Depression, unspecified: Secondary | ICD-10-CM

## 2014-03-24 DIAGNOSIS — G4700 Insomnia, unspecified: Secondary | ICD-10-CM

## 2014-03-24 LAB — CBC WITH DIFFERENTIAL/PLATELET
BASOS ABS: 0 10*3/uL (ref 0.0–0.1)
Basophils Relative: 0 % (ref 0–1)
Eosinophils Absolute: 0 10*3/uL (ref 0.0–0.7)
Eosinophils Relative: 1 % (ref 0–5)
HEMATOCRIT: 36.3 % — AB (ref 39.0–52.0)
HEMOGLOBIN: 12.3 g/dL — AB (ref 13.0–17.0)
LYMPHS ABS: 2.1 10*3/uL (ref 0.7–4.0)
LYMPHS PCT: 60 % — AB (ref 12–46)
MCH: 34.4 pg — AB (ref 26.0–34.0)
MCHC: 33.9 g/dL (ref 30.0–36.0)
MCV: 101.4 fL — ABNORMAL HIGH (ref 78.0–100.0)
MPV: 9 fL (ref 8.6–12.4)
Monocytes Absolute: 0.2 10*3/uL (ref 0.1–1.0)
Monocytes Relative: 7 % (ref 3–12)
Neutro Abs: 1.1 10*3/uL — ABNORMAL LOW (ref 1.7–7.7)
Neutrophils Relative %: 32 % — ABNORMAL LOW (ref 43–77)
Platelets: 272 10*3/uL (ref 150–400)
RBC: 3.58 MIL/uL — AB (ref 4.22–5.81)
RDW: 13.2 % (ref 11.5–15.5)
WBC: 3.5 10*3/uL — ABNORMAL LOW (ref 4.0–10.5)

## 2014-03-24 LAB — COMPLETE METABOLIC PANEL WITH GFR
ALBUMIN: 4 g/dL (ref 3.5–5.2)
ALK PHOS: 48 U/L (ref 39–117)
ALT: 21 U/L (ref 0–53)
AST: 30 U/L (ref 0–37)
BUN: 17 mg/dL (ref 6–23)
CALCIUM: 9.3 mg/dL (ref 8.4–10.5)
CO2: 28 mEq/L (ref 19–32)
CREATININE: 1.14 mg/dL (ref 0.50–1.35)
Chloride: 103 mEq/L (ref 96–112)
GFR, Est African American: 86 mL/min
GFR, Est Non African American: 74 mL/min
GLUCOSE: 82 mg/dL (ref 70–99)
POTASSIUM: 4.4 meq/L (ref 3.5–5.3)
Sodium: 137 mEq/L (ref 135–145)
Total Bilirubin: 0.5 mg/dL (ref 0.2–1.2)
Total Protein: 7.2 g/dL (ref 6.0–8.3)

## 2014-03-25 LAB — URINE CYTOLOGY ANCILLARY ONLY
CHLAMYDIA, DNA PROBE: NEGATIVE
Neisseria Gonorrhea: NEGATIVE

## 2014-03-25 LAB — MICROALBUMIN / CREATININE URINE RATIO
Creatinine, Urine: 131.9 mg/dL
Microalb Creat Ratio: 3 mg/g (ref 0.0–30.0)
Microalb, Ur: 0.4 mg/dL (ref <2.0)

## 2014-03-25 LAB — RPR

## 2014-03-25 LAB — T-HELPER CELL (CD4) - (RCID CLINIC ONLY)
CD4 T CELL ABS: 690 /uL (ref 400–2700)
CD4 T CELL HELPER: 38 % (ref 33–55)

## 2014-03-26 LAB — HIV-1 RNA QUANT-NO REFLEX-BLD: HIV 1 RNA Quant: <20 copies/mL (ref <20)

## 2014-03-30 ENCOUNTER — Ambulatory Visit: Payer: Medicare (Managed Care)

## 2014-04-06 ENCOUNTER — Ambulatory Visit: Payer: Self-pay | Admitting: Infectious Disease

## 2014-04-07 ENCOUNTER — Ambulatory Visit: Payer: Medicare (Managed Care)

## 2014-04-07 ENCOUNTER — Other Ambulatory Visit: Payer: Self-pay | Admitting: Licensed Clinical Social Worker

## 2014-04-07 MED ORDER — AMITRIPTYLINE HCL 50 MG PO TABS: 50.0000 mg | ORAL_TABLET | Freq: Every day | ORAL | Status: DC

## 2014-04-15 ENCOUNTER — Ambulatory Visit: Payer: Medicare (Managed Care)

## 2014-04-30 ENCOUNTER — Other Ambulatory Visit: Payer: Self-pay | Admitting: *Deleted

## 2014-04-30 DIAGNOSIS — B0229 Other postherpetic nervous system involvement: Secondary | ICD-10-CM

## 2014-04-30 MED ORDER — PREGABALIN 75 MG PO CAPS
75.0000 mg | ORAL_CAPSULE | Freq: Two times a day (BID) | ORAL | Status: DC
Start: 2014-04-30 — End: 2014-06-04

## 2014-05-11 ENCOUNTER — Encounter: Payer: Self-pay | Admitting: Infectious Disease

## 2014-05-11 ENCOUNTER — Ambulatory Visit (INDEPENDENT_AMBULATORY_CARE_PROVIDER_SITE_OTHER): Payer: Self-pay | Admitting: Infectious Disease

## 2014-05-11 ENCOUNTER — Other Ambulatory Visit: Payer: Self-pay | Admitting: *Deleted

## 2014-05-11 VITALS — BP 99/70 | HR 85 | Temp 98.3°F | Wt 144.0 lb

## 2014-05-11 DIAGNOSIS — R21 Rash and other nonspecific skin eruption: Secondary | ICD-10-CM

## 2014-05-11 DIAGNOSIS — B20 Human immunodeficiency virus [HIV] disease: Secondary | ICD-10-CM

## 2014-05-11 MED ORDER — TRIAMCINOLONE ACETONIDE 0.5 % EX OINT: 1.0000 "application " | TOPICAL_OINTMENT | Freq: Two times a day (BID) | CUTANEOUS | Status: DC

## 2014-05-11 MED ORDER — ABACAVIR-DOLUTEGRAVIR-LAMIVUD 600-50-300 MG PO TABS: 1.0000 | ORAL_TABLET | Freq: Every day | ORAL | Status: DC

## 2014-05-11 NOTE — Telephone Encounter (Signed)
ADAP Application 

## 2014-05-11 NOTE — Progress Notes (Signed)
Subjective:    Patient ID: Arthur Lynch, male    DOB: Jul 21, 1960, 54 y.o.   MRN: [id number]  Rash This is a chronic problem. The current episode started more than 1 month ago. The problem has been gradually worsening since onset. The affected locations include the right lower leg, right ankle, left upper leg and left lower leg. The rash is characterized by itchiness. He was exposed to nothing. Pertinent negatives include no anorexia, congestion, cough, diarrhea, eye pain, facial edema, fatigue, fever, joint pain, nail changes, rhinorrhea, shortness of breath, sore throat or vomiting. Past treatments include moisturizer. The treatment provided no relief.    Arthur Lynch is a 54 y.o. male who has  been doing superbly well on his  antiviral regimen, of isentress and truvada whom we changde to Triumeq and he was agreeable to this. He continued with essentially undetectable viral load and healthy CD4 count on this regimen.  Lab Results  Component Value Date   HIV1RNAQUANT <20 03/24/2014   Lab Results  Component Value Date   CD4TABS 690 03/24/2014   CD4TABS 560 01/09/2014   CD4TABS 840 07/22/2013    When we last saw him he was suffering from olecranon bursitis that has since resolved.  Today he points out areas of rash on legs that have cropped up. He says within the past several months. He had carried a diagnosis of KS but not sure he ever had biopsy.     Review of Systems  Constitutional: Negative for fever, activity change, appetite change, fatigue and unexpected weight change.  HENT: Negative for congestion, rhinorrhea, sinus pressure, sneezing, sore throat and trouble swallowing.   Eyes: Negative for photophobia, pain and visual disturbance.  Respiratory: Negative for cough, chest tightness, shortness of breath, wheezing and stridor.   Cardiovascular: Negative for chest pain, palpitations and leg swelling.  Gastrointestinal: Negative for nausea, vomiting, diarrhea,  constipation, blood in stool, abdominal distention, anal bleeding and anorexia.  Genitourinary: Negative for dysuria, hematuria, flank pain and difficulty urinating.  Musculoskeletal: Negative for myalgias, back pain, joint pain, joint swelling, arthralgias and gait problem.  Skin: Positive for rash. Negative for nail changes, color change, pallor and wound.  Neurological: Negative for dizziness, tremors, weakness and light-headedness.  Hematological: Negative for adenopathy. Does not bruise/bleed easily.  Psychiatric/Behavioral: Negative for behavioral problems, confusion, decreased concentration and agitation.       Objective:   Physical Exam  Constitutional: He is oriented to person, place, and time. He appears well-developed and well-nourished. No distress.  HENT:  Head: Normocephalic and atraumatic.  Mouth/Throat: Oropharynx is clear and moist. No oropharyngeal exudate.  Eyes: Conjunctivae and EOM are normal. Pupils are equal, round, and reactive to light. No scleral icterus.  Neck: Normal range of motion. Neck supple. No JVD present.  Cardiovascular: Normal rate, regular rhythm and normal heart sounds.  Exam reveals no gallop and no friction rub.   No murmur heard. Pulmonary/Chest: Effort normal and breath sounds normal. No respiratory distress. He has no wheezes.  Abdominal: Soft. He exhibits no distension and no mass. There is no tenderness. There is no rebound and no guarding.  Musculoskeletal: He exhibits no edema or tenderness.       Left elbow: He exhibits swelling.  Lymphadenopathy:    He has no cervical adenopathy.  Neurological: He is alert and oriented to person, place, and time. He has normal reflexes. He exhibits normal muscle tone. Coordination normal.  Skin: Skin is warm. Rash noted. He is not diaphoretic.  Psychiatric: He has a normal mood and affect. His behavior is normal. Judgment and thought content normal. His speech is delayed.   Rash  05/11/14:             Assessment & Plan:  HIV: Doing very well on [place] . ADAP enrolled    Alcoholism:  Hopefully sober  Depression :amitriptyline qhs  Neuralgia:continue amitriptyline  Rash: does look like KS a bit and I recall having seen rash like this before. He does not have insurance but can try to see derm if not OK to try topical steroid and if not improvement I would biopsy it in clinic  I spent greater than 40 minutes with the patient including greater than 50% of time in face to face counsel of the patient and in coordination of their care.  REPRIEVE: would be excellent candidate for REPRIEVE. I introduced him to [employer]

## 2014-05-13 ENCOUNTER — Ambulatory Visit (INDEPENDENT_AMBULATORY_CARE_PROVIDER_SITE_OTHER): Payer: Self-pay | Admitting: *Deleted

## 2014-05-13 VITALS — BP 118/84 | HR 70 | Temp 98.7°F | Resp 16 | Ht 73.0 in | Wt 146.0 lb

## 2014-05-13 DIAGNOSIS — Z006 Encounter for examination for normal comparison and control in clinical research program: Secondary | ICD-10-CM

## 2014-05-13 LAB — LIPID PANEL
CHOL/HDL RATIO: 2.5 ratio
Cholesterol: 164 mg/dL (ref 0–200)
HDL: 66 mg/dL (ref >=40)
LDL Cholesterol: 88 mg/dL (ref 0–99)
TRIGLYCERIDES: 49 mg/dL (ref <150)
VLDL: 10 mg/dL (ref 0–40)

## 2014-05-13 NOTE — Progress Notes (Deleted)
Arthur Lynch is here for [id number] screening visit - Randomized Trial to prevent Vascular Events in HIV (REPRIEVE study). We reviewed the informed consent together. I fully explained the consent, risks, benefits, responsibilities, and answered questions. Participant verbalized understanding and signed the consent witnessed by me. I gave a copy of the signed consent to participant.  We also reviewed informed consent for mechanistic substudy of [employer]: [id number] - Effects of Pitavastatin on Coronary Artery Diseas and Inflammatory Biomarkers. I fully explained the consent, risks, benefits, responsibilities, and answered questions. Participant verbalized understanding and signed the consent witnessed by me. I gave a copy of the signed consent to participant.   Fasting labs were obtained and vital signs were within normal range. Questionnaires completed and past and current medical history along with medications and S&S were reviewed. He received $50 gift card for screening visit. When results are confirmed we will contact him about his eligibility and schedule entry visit and CT scan if appropriate. Eliezer Champagne RN.

## 2014-05-13 NOTE — Progress Notes (Signed)
   Subjective:    Patient ID: Arthur Lynch, male    DOB: 1960-08-31, 54 y.o.   MRN: [id number]  HPI    Review of Systems     Objective:   Physical Exam  Constitutional: He is oriented to person, place, and time. He appears well-developed and well-nourished.  HENT:  Head: Normocephalic.  Nose: Nose normal.  Eyes: Conjunctivae are normal. Pupils are equal, round, and reactive to light.  Neck: Normal range of motion. No thyromegaly present.  Cardiovascular: Normal rate, regular rhythm, normal heart sounds and intact distal pulses.   Pulmonary/Chest: Effort normal and breath sounds normal. He has no wheezes.  Abdominal: Soft. Bowel sounds are normal.  Musculoskeletal: Normal range of motion. He exhibits no edema.  Lymphadenopathy:    He has no cervical adenopathy.  Neurological: He is alert and oriented to person, place, and time.  Skin: Skin is warm and dry. Rash noted.  Psychiatric: He has a normal mood and affect. His behavior is normal. Judgment normal.   States it has been 90 days since he has been sober and continues to go to [employer]. I congratulated him on this achievement. Continues to present with rash to lower extremities bilaterally. Has not had Kenalog filled due to cost of drug. He wants to see a dermatologist first. He is to [facility] to apply for [facility] card and then will be referred. He was dx with KS in 2004 and was supposed to have this biopsied but could not afford the price of this service. Biopsy was never conducted but notes suggest this rash in 2004 was consistent with KS.  Left elbow swelling has resolved. He denies any pain/tenderness in this area.        Assessment & Plan:  Ziaire is here for A5332 screening visit - Randomized Trial to prevent Vascular Events in HIV (REPRIEVE study). We reviewed the informed consent together. I fully explained the consent, risks, benefits, responsibilities, and answered questions. Participant verbalized  understanding and signed the consent witnessed by me. I gave a copy of the signed consent to participant.  We also reviewed informed consent for mechanistic substudy of Reprieve: A5333s - Effects of Pitavastatin on Coronary Artery Diseas and Inflammatory Biomarkers. I fully explained the consent, risks, benefits, responsibilities, and answered questions. Participant verbalized understanding and signed the consent witnessed by me. I gave a copy of the signed consent to participant.   Fasting labs were obtained and vital signs were within normal range. Questionnaires completed and past and current medical history along with medications and S&S were reviewed. He received $50 gift card for screening visit. When results are confirmed we will contact him about his eligibility and schedule entry visit and CT scan if appropriate. Eliezer Champagne RN.

## 2014-05-18 ENCOUNTER — Other Ambulatory Visit: Payer: Self-pay | Admitting: *Deleted

## 2014-05-18 DIAGNOSIS — Z006 Encounter for examination for normal comparison and control in clinical research program: Secondary | ICD-10-CM

## 2014-05-21 ENCOUNTER — Telehealth: Payer: Self-pay | Admitting: *Deleted

## 2014-05-21 NOTE — Telephone Encounter (Signed)
Wanted to discuss concerns about possible KS lesions that Dr. Tommy Medal has scheduled an appointment for a biopsy.  Pt has reviewed information on the Internet.  RN advised that the pt's VL and CD4 labs have been good for several years.  RN shared that Dr. Tommy Medal was being cautious and wanted to "rule out" KS.  Pt verbalized understanding.  RN offered for the pt to call [facility] if he has further questions.  He responded that he would.  He verbalized that he would be keeping the appt with Dr. Tommy Medal on 06/04/14 @ 8:45 AM.

## 2014-06-04 ENCOUNTER — Encounter: Payer: Self-pay | Admitting: Infectious Disease

## 2014-06-04 ENCOUNTER — Ambulatory Visit (INDEPENDENT_AMBULATORY_CARE_PROVIDER_SITE_OTHER): Payer: Self-pay | Admitting: Infectious Disease

## 2014-06-04 ENCOUNTER — Other Ambulatory Visit: Payer: Self-pay | Admitting: Licensed Clinical Social Worker

## 2014-06-04 VITALS — BP 112/76 | HR 82 | Temp 98.1°F | Ht 73.0 in | Wt 141.0 lb

## 2014-06-04 DIAGNOSIS — G500 Trigeminal neuralgia: Secondary | ICD-10-CM

## 2014-06-04 DIAGNOSIS — B0229 Other postherpetic nervous system involvement: Secondary | ICD-10-CM

## 2014-06-04 DIAGNOSIS — B20 Human immunodeficiency virus [HIV] disease: Secondary | ICD-10-CM | POA: Insufficient documentation

## 2014-06-04 DIAGNOSIS — R21 Rash and other nonspecific skin eruption: Secondary | ICD-10-CM

## 2014-06-04 DIAGNOSIS — M792 Neuralgia and neuritis, unspecified: Secondary | ICD-10-CM

## 2014-06-04 MED ORDER — PREGABALIN 75 MG PO CAPS: 75.0000 mg | ORAL_CAPSULE | Freq: Two times a day (BID) | ORAL | Status: DC

## 2014-06-04 NOTE — Progress Notes (Signed)
Subjective:    Patient ID: Arthur Lynch, male    DOB: Dec 06, 1960, 54 y.o.   MRN: [id number]  Rash This is a chronic problem. The current episode started more than 1 month ago. The problem has been gradually worsening since onset. The affected locations include the right lower leg, right ankle, left upper leg and left lower leg. The rash is characterized by itchiness. He was exposed to nothing. Pertinent negatives include no anorexia, congestion, cough, diarrhea, eye pain, facial edema, fatigue, fever, joint pain, nail changes, rhinorrhea, shortness of breath, sore throat or vomiting. Past treatments include moisturizer. The treatment provided no relief. His past medical history is significant for varicella.    Arthur Lynch is a 55 y.o. male who has  been doing superbly well on his  antiviral regimen, of isentress and truvada whom we changde to Triumeq and he was agreeable to this. He continued with essentially undetectable viral load and healthy CD4 count on this regimen.  Lab Results  Component Value Date   HIV1RNAQUANT <20 03/24/2014   Lab Results  Component Value Date   CD4TABS 690 03/24/2014   CD4TABS 560 01/09/2014   CD4TABS 840 07/22/2013    He came in today for a punch biopsy of his rash.  This rash has been present for approximately a year and is on dorsum of feet bilaterally and is pruritic, nonpainful.  He DID carry a diagnosis by "CHART" of Kaposis but this was a diagnosis made on clinical grounds when he had AIDS. He is emphatic that the skin lesions he suffered from when he was thought to have KS resolved with ARV therapy and that this rash is "NEW!, this is new!"  He is being considered for REPRIEVE but KS is  exlusion criteria and we need biopsy regardless .     Review of Systems  Constitutional: Negative for fever, activity change, appetite change, fatigue and unexpected weight change.  HENT: Negative for congestion, rhinorrhea, sinus pressure, sneezing, sore  throat and trouble swallowing.   Eyes: Negative for photophobia, pain and visual disturbance.  Respiratory: Negative for cough, chest tightness, shortness of breath, wheezing and stridor.   Cardiovascular: Negative for chest pain, palpitations and leg swelling.  Gastrointestinal: Negative for nausea, vomiting, diarrhea, constipation, blood in stool, abdominal distention, anal bleeding and anorexia.  Genitourinary: Negative for dysuria, hematuria, flank pain and difficulty urinating.  Musculoskeletal: Negative for myalgias, back pain, joint pain, joint swelling, arthralgias and gait problem.  Skin: Positive for rash. Negative for nail changes, color change, pallor and wound.  Neurological: Negative for dizziness, tremors, weakness and light-headedness.  Hematological: Negative for adenopathy. Does not bruise/bleed easily.  Psychiatric/Behavioral: Negative for behavioral problems, confusion, decreased concentration and agitation.       Objective:   Physical Exam  Constitutional: He is oriented to person, place, and time. He appears well-developed and well-nourished. No distress.  HENT:  Head: Normocephalic and atraumatic.  Mouth/Throat: Oropharynx is clear and moist. No oropharyngeal exudate.  Eyes: Conjunctivae and EOM are normal. Pupils are equal, round, and reactive to light. No scleral icterus.  Neck: Normal range of motion. Neck supple. No JVD present.  Cardiovascular: Normal rate, regular rhythm and normal heart sounds.  Exam reveals no gallop and no friction rub.   No murmur heard. Pulmonary/Chest: Effort normal and breath sounds normal. No respiratory distress. He has no wheezes.  Abdominal: Soft. He exhibits no distension and no mass. There is no tenderness. There is no rebound and no guarding.  Musculoskeletal: He exhibits no edema or tenderness.       Left elbow: He exhibits swelling.  Lymphadenopathy:    He has no cervical adenopathy.  Neurological: He is alert and oriented to  person, place, and time. He has normal reflexes. He exhibits normal muscle tone. Coordination normal.  Skin: Skin is warm. Rash noted. He is not diaphoretic.  Psychiatric: He has a normal mood and affect. His behavior is normal. Judgment and thought content normal. His speech is delayed.   Rash 05/11/14:             Assessment & Plan:  Rash: Punch biopsy performed  Permit: informed consent obtained and present in the chart signed  Description: Area on left leg of one of lesions identified marked. Area prepped with topical anesthetic, Area infiltrated with 6 ml of 1% lidocaine to achieve anesthesia.  Punch biopsy performed with 58mm blade.   Sent to pathology  Complications : none  EBL: minimal  He is sent home with topical antimicrobials for the wound     HIV: Doing very well on TRIUMEQ . ADAP enrolled    Neuralgia:continue amitriptyline and wrote for lyrica

## 2014-06-09 ENCOUNTER — Other Ambulatory Visit (HOSPITAL_COMMUNITY): Payer: Self-pay

## 2014-06-10 ENCOUNTER — Telehealth: Payer: Self-pay | Admitting: *Deleted

## 2014-06-10 NOTE — Telephone Encounter (Signed)
Pt asking about his biopsy results from his las visit.  Results not ready yet.  Pt informed.

## 2014-06-13 ENCOUNTER — Other Ambulatory Visit: Payer: Self-pay | Admitting: Infectious Disease

## 2014-06-13 DIAGNOSIS — B20 Human immunodeficiency virus [HIV] disease: Secondary | ICD-10-CM

## 2014-06-22 NOTE — Telephone Encounter (Signed)
requesting results - not in EPIC.  Faxed/mailed results not available yet.  Pt informed by T. Cari Caraway, CMA.

## 2014-07-08 ENCOUNTER — Telehealth: Payer: Self-pay | Admitting: *Deleted

## 2014-07-08 NOTE — Telephone Encounter (Signed)
Patient called very upset he is out of medication and has been out for at least a month. He advised he was speaking with Royston Cowper and was unable to get a reason it has taken so long and she allegedly hung up the phone on the patient. I was able to help him get his medications until his ADAP comes through and advised him to come pick it up.

## 2014-07-28 ENCOUNTER — Encounter: Payer: Self-pay | Admitting: *Deleted

## 2014-07-28 VITALS — BP 104/73 | HR 96 | Temp 97.8°F | Resp 16 | Ht 73.0 in | Wt 138.0 lb

## 2014-07-28 DIAGNOSIS — Z006 Encounter for examination for normal comparison and control in clinical research program: Secondary | ICD-10-CM

## 2014-07-28 DIAGNOSIS — D508 Other iron deficiency anemias: Secondary | ICD-10-CM

## 2014-07-28 LAB — COMPREHENSIVE METABOLIC PANEL
ALT: 22 U/L (ref 0–53)
AST: 32 U/L (ref 0–37)
Albumin: 3.9 g/dL (ref 3.5–5.2)
Alkaline Phosphatase: 47 U/L (ref 39–117)
BILIRUBIN TOTAL: 0.9 mg/dL (ref 0.2–1.2)
BUN: 12 mg/dL (ref 6–23)
CO2: 27 meq/L (ref 19–32)
CREATININE: 1.26 mg/dL (ref 0.50–1.35)
Calcium: 9.4 mg/dL (ref 8.4–10.5)
Chloride: 102 mEq/L (ref 96–112)
Glucose, Bld: 81 mg/dL (ref 70–99)
Potassium: 3.9 mEq/L (ref 3.5–5.3)
Sodium: 138 mEq/L (ref 135–145)
Total Protein: 7.4 g/dL (ref 6.0–8.3)

## 2014-07-28 LAB — CBC WITH DIFFERENTIAL/PLATELET
Basophils Absolute: 0 10*3/uL (ref 0.0–0.1)
Basophils Relative: 1 % (ref 0–1)
Eosinophils Absolute: 0 10*3/uL (ref 0.0–0.7)
Eosinophils Relative: 0 % (ref 0–5)
HEMATOCRIT: 39.4 % (ref 39.0–52.0)
Hemoglobin: 13.1 g/dL (ref 13.0–17.0)
LYMPHS ABS: 1.4 10*3/uL (ref 0.7–4.0)
LYMPHS PCT: 47 % — AB (ref 12–46)
MCH: 34.2 pg — ABNORMAL HIGH (ref 26.0–34.0)
MCHC: 33.2 g/dL (ref 30.0–36.0)
MCV: 102.9 fL — ABNORMAL HIGH (ref 78.0–100.0)
MONOS PCT: 14 % — AB (ref 3–12)
MPV: 9.9 fL (ref 8.6–12.4)
Monocytes Absolute: 0.4 10*3/uL (ref 0.1–1.0)
NEUTROS ABS: 1.1 10*3/uL — AB (ref 1.7–7.7)
NEUTROS PCT: 38 % — AB (ref 43–77)
Platelets: 284 10*3/uL (ref 150–400)
RBC: 3.83 MIL/uL — AB (ref 4.22–5.81)
RDW: 14.1 % (ref 11.5–15.5)
WBC: 2.9 10*3/uL — AB (ref 4.0–10.5)

## 2014-07-28 LAB — IRON AND TIBC
%SAT: 32 % (ref 20–55)
Iron: 83 ug/dL (ref 42–165)
TIBC: 261 ug/dL (ref 215–435)
UIBC: 178 ug/dL (ref 125–400)

## 2014-07-28 LAB — LIPID PANEL
Cholesterol: 188 mg/dL (ref 0–200)
HDL: 120 mg/dL (ref >=40)
LDL Cholesterol: 59 mg/dL (ref 0–99)
Total CHOL/HDL Ratio: 1.6 Ratio
Triglycerides: 47 mg/dL (ref <150)
VLDL: 9 mg/dL (ref 0–40)

## 2014-07-28 LAB — FERRITIN: Ferritin: 138 ng/mL (ref 22–322)

## 2014-07-28 LAB — FOLATE: Folate: 11.6 ng/mL

## 2014-07-28 LAB — VITAMIN B12: Vitamin B-12: 1149 pg/mL — ABNORMAL HIGH (ref 211–911)

## 2014-07-28 MED ORDER — ENSURE PO LIQD: 237.0000 mL | Freq: Two times a day (BID) | ORAL | Status: DC

## 2014-07-29 ENCOUNTER — Encounter: Payer: Self-pay | Admitting: *Deleted

## 2014-07-29 NOTE — Progress Notes (Signed)
Dontrey is here for [phone] screening visit - Randomized Trial to prevent Vascular Events in HIV (REPRIEVE study). We reviewed the informed consent together. I fully explained the consent, risks, benefits, responsibilities, and answered questions. Participant verbalized understanding and signed the consent witnessed by me. I gave a copy of the signed consent to participant. This is a re-screen d/t questionable KS which was ruled out and determined to be possible subtle chronic contact dermatitis/eczema. Vital signs obtained. He has had a weight loss of 8lbs in the past 2 mos placing his BMI at 18.2. He states this weight-loss was unintentional and related to stress. Spoke with Dr. Tommy Medal and [facility] and have a RX for ensure. I stressed that he NOT take ensure with his HIV medications as this interferes with therapeutic range. He needs to drink ensure 6 HOURS BEFORE taking his TRIUMEQ and/or 2 HOURS AFTER taking TRIUMEQ. He will see [facility] on June 2nd and will get his ensure then. Also has whitening of nails completely covering some and others are at least 50% white. He states this comes and goes and he has no other symptoms. After talking with Dr. Tommy Medal and taking into consideration his weight loss, he could have a deficiency...possibly iron? Will check anemia panel. Fasting labs were obtained. He received $50 gift card for study visit as well as 2 bus passes. Next appointment scheduled for June 2nd at 10am. Eliezer Champagne RN

## 2014-08-03 ENCOUNTER — Encounter: Payer: Self-pay | Admitting: Infectious Disease

## 2014-08-13 ENCOUNTER — Encounter (INDEPENDENT_AMBULATORY_CARE_PROVIDER_SITE_OTHER): Payer: Self-pay

## 2014-08-13 DIAGNOSIS — Z006 Encounter for examination for normal comparison and control in clinical research program: Secondary | ICD-10-CM

## 2014-08-24 ENCOUNTER — Telehealth: Payer: Self-pay | Admitting: *Deleted

## 2014-08-24 NOTE — Telephone Encounter (Signed)
Patent called and advised that he has called several times and not gotten a call back. He advised he has numbness of his anus since 08/19/14. He advises he has not done anything new except start the study medication. He advised he was told to call the office if something happened. No other complaints having normal bowel movements just wants to make sure he should not be doing anything different. Advised study staff is not in but will make sure he gets a call back asap.

## 2014-09-10 ENCOUNTER — Encounter (INDEPENDENT_AMBULATORY_CARE_PROVIDER_SITE_OTHER): Payer: Self-pay | Admitting: *Deleted

## 2014-09-10 VITALS — BP 97/72 | HR 102 | Temp 98.6°F | Resp 16 | Wt 138.8 lb

## 2014-09-10 DIAGNOSIS — Z006 Encounter for examination for normal comparison and control in clinical research program: Secondary | ICD-10-CM

## 2014-09-10 LAB — COMPREHENSIVE METABOLIC PANEL
ALK PHOS: 50 U/L (ref 39–117)
ALT: 20 U/L (ref 0–53)
AST: 29 U/L (ref 0–37)
Albumin: 3.9 g/dL (ref 3.5–5.2)
BUN: 12 mg/dL (ref 6–23)
CALCIUM: 9.1 mg/dL (ref 8.4–10.5)
CHLORIDE: 102 meq/L (ref 96–112)
CO2: 28 meq/L (ref 19–32)
Creat: 1.12 mg/dL (ref 0.50–1.35)
Glucose, Bld: 84 mg/dL (ref 70–99)
Potassium: 4.2 mEq/L (ref 3.5–5.3)
SODIUM: 142 meq/L (ref 135–145)
Total Bilirubin: 0.5 mg/dL (ref 0.2–1.2)
Total Protein: 7.3 g/dL (ref 6.0–8.3)

## 2014-09-10 NOTE — Progress Notes (Signed)
Arthur Lynch is here for his 1 month Reprieve study visit. He says he is still noticing some numbness in his perineal area and lower buttocks from time to time. He denies any muscle pain or weakness or any other problems. He says his adherence is excellent and pill counts were off by a few pills. He has an appt. With Dr. Tommy Medal in 3 weeks and will return for study in 3 months.

## 2014-09-24 ENCOUNTER — Telehealth: Payer: Self-pay | Admitting: *Deleted

## 2014-09-24 NOTE — Telephone Encounter (Signed)
Arthur Lynch called and said he had been having some rt groin pain down his leg the past couple days. He said yesterday he was very numb on that side from his knee to his foot and he could hardly walk, but that it was better today. We discussed it and I told him I didn't think it was related to his study drug, and that it sounded more like a strained back or inflamed ligament. I did instruct him to ice it if it occurred again and to not lift for a few days. I also told him to take something like ibuprofen to help with pain and if it got worse to seek help at Urgent Care.

## 2014-09-30 ENCOUNTER — Ambulatory Visit: Payer: Self-pay | Admitting: Infectious Disease

## 2014-10-12 ENCOUNTER — Ambulatory Visit: Payer: Self-pay

## 2014-11-05 ENCOUNTER — Other Ambulatory Visit: Payer: Self-pay | Admitting: Infectious Disease

## 2014-11-05 DIAGNOSIS — G4700 Insomnia, unspecified: Secondary | ICD-10-CM

## 2014-11-09 ENCOUNTER — Ambulatory Visit (INDEPENDENT_AMBULATORY_CARE_PROVIDER_SITE_OTHER): Payer: Medicare (Managed Care) | Admitting: Infectious Disease

## 2014-11-09 ENCOUNTER — Encounter: Payer: Self-pay | Admitting: Infectious Disease

## 2014-11-09 VITALS — BP 114/84 | HR 64 | Temp 97.7°F | Wt 140.0 lb

## 2014-11-09 DIAGNOSIS — D72819 Decreased white blood cell count, unspecified: Secondary | ICD-10-CM

## 2014-11-09 DIAGNOSIS — B20 Human immunodeficiency virus [HIV] disease: Secondary | ICD-10-CM

## 2014-11-09 DIAGNOSIS — Z23 Encounter for immunization: Secondary | ICD-10-CM

## 2014-11-09 DIAGNOSIS — F1021 Alcohol dependence, in remission: Secondary | ICD-10-CM

## 2014-11-09 DIAGNOSIS — G500 Trigeminal neuralgia: Secondary | ICD-10-CM

## 2014-11-09 LAB — RPR

## 2014-11-09 LAB — CBC WITH DIFFERENTIAL/PLATELET
Basophils Absolute: 0 10*3/uL (ref 0.0–0.1)
Basophils Relative: 0 % (ref 0–1)
EOS ABS: 0 10*3/uL (ref 0.0–0.7)
EOS PCT: 1 % (ref 0–5)
HEMATOCRIT: 38.7 % — AB (ref 39.0–52.0)
Hemoglobin: 12.7 g/dL — ABNORMAL LOW (ref 13.0–17.0)
LYMPHS ABS: 1.6 10*3/uL (ref 0.7–4.0)
Lymphocytes Relative: 48 % — ABNORMAL HIGH (ref 12–46)
MCH: 33.9 pg (ref 26.0–34.0)
MCHC: 32.8 g/dL (ref 30.0–36.0)
MCV: 103.2 fL — AB (ref 78.0–100.0)
MONO ABS: 0.3 10*3/uL (ref 0.1–1.0)
MONOS PCT: 9 % (ref 3–12)
MPV: 9.2 fL (ref 8.6–12.4)
Neutro Abs: 1.4 10*3/uL — ABNORMAL LOW (ref 1.7–7.7)
Neutrophils Relative %: 42 % — ABNORMAL LOW (ref 43–77)
PLATELETS: 299 10*3/uL (ref 150–400)
RBC: 3.75 MIL/uL — ABNORMAL LOW (ref 4.22–5.81)
RDW: 13.1 % (ref 11.5–15.5)
WBC: 3.3 10*3/uL — ABNORMAL LOW (ref 4.0–10.5)

## 2014-11-09 LAB — COMPLETE METABOLIC PANEL WITH GFR
ALBUMIN: 4.2 g/dL (ref 3.6–5.1)
ALK PHOS: 47 U/L (ref 40–115)
ALT: 14 U/L (ref 9–46)
AST: 23 U/L (ref 10–35)
BILIRUBIN TOTAL: 0.5 mg/dL (ref 0.2–1.2)
BUN: 15 mg/dL (ref 7–25)
CO2: 30 mmol/L (ref 20–31)
CREATININE: 1.31 mg/dL (ref 0.70–1.33)
Calcium: 9.6 mg/dL (ref 8.6–10.3)
Chloride: 101 mmol/L (ref 98–110)
GFR, EST NON AFRICAN AMERICAN: 63 mL/min (ref >=60)
GFR, Est African American: 72 mL/min (ref >=60)
Glucose, Bld: 83 mg/dL (ref 65–99)
Potassium: 4.4 mmol/L (ref 3.5–5.3)
Sodium: 139 mmol/L (ref 135–146)
TOTAL PROTEIN: 7.4 g/dL (ref 6.1–8.1)

## 2014-11-09 NOTE — Patient Instructions (Signed)
Make sure you enroll in ADAP today!

## 2014-11-09 NOTE — Progress Notes (Signed)
  Subjective:    Patient ID: Arthur Lynch, male    DOB: Oct 13, 1960, 54 y.o.   MRN: [id number]  HPI  Arthur Lynch is a 54 y.o. male who has  been doing superbly well on his  antiviral regimen, of o Triumeq. He continued with essentially undetectable viral load and healthy CD4 count on this regimen  He takes amitryptiline at night and is no pitavastatin vs placebo via REPRIEVE.  Lab Results  Component Value Date   HIV1RNAQUANT <20 03/24/2014   Lab Results  Component Value Date   CD4TABS 690 03/24/2014   CD4TABS 560 01/09/2014   CD4TABS 840 07/22/2013      Review of Systems  Constitutional: Negative for activity change, appetite change and unexpected weight change.  HENT: Negative for sinus pressure, sneezing and trouble swallowing.   Eyes: Negative for photophobia and visual disturbance.  Respiratory: Negative for chest tightness, wheezing and stridor.   Cardiovascular: Negative for chest pain, palpitations and leg swelling.  Gastrointestinal: Negative for nausea, constipation, blood in stool, abdominal distention and anal bleeding.  Genitourinary: Negative for dysuria, hematuria, flank pain and difficulty urinating.  Musculoskeletal: Negative for myalgias, back pain, joint swelling, arthralgias and gait problem.  Skin: Negative for color change, pallor and wound.  Neurological: Negative for dizziness, tremors, weakness and light-headedness.  Hematological: Negative for adenopathy. Does not bruise/bleed easily.  Psychiatric/Behavioral: Negative for behavioral problems, confusion, decreased concentration and agitation.       Objective:   Physical Exam  Constitutional: He is oriented to person, place, and time. He appears well-developed and well-nourished. No distress.  HENT:  Head: Normocephalic and atraumatic.  Mouth/Throat: Oropharynx is clear and moist. No oropharyngeal exudate.  Eyes: Conjunctivae and EOM are normal. Pupils are equal, round, and reactive to light.  No scleral icterus.  Neck: Normal range of motion. Neck supple. No JVD present.  Cardiovascular: Normal rate and regular rhythm.   Pulmonary/Chest: Effort normal. No respiratory distress. He has no wheezes.  Abdominal: Soft. He exhibits no distension and no mass. There is no tenderness. There is no rebound and no guarding.  Musculoskeletal: He exhibits no edema or tenderness.       Left elbow: He exhibits swelling.  Lymphadenopathy:    He has no cervical adenopathy.  Neurological: He is alert and oriented to person, place, and time. He has normal reflexes. He exhibits normal muscle tone. Coordination normal.  Skin: Skin is warm. He is not diaphoretic.  Psychiatric: He has a normal mood and affect. His behavior is normal. Judgment and thought content normal.         Assessment & Plan:    HIV: Doing very well on TRIUMEQ . ADAP needs to be renewed which he will do. He has an extra bottle of pills so no need for [facility]   Neuralgia:continue amitriptyline   Alcholism  In remission: seeing AA folks. Is aware of our substance abuse counselors and met with Max int he past  I spent greater than 25 minutes with the patient including greater than 50% of time in face to face counsel of the patient re his HIV, his prior etohism, his neuralgia and in coordination of their care.

## 2014-11-10 LAB — T-HELPER CELL (CD4) - (RCID CLINIC ONLY)
CD4 % Helper T Cell: 39 % (ref 33–55)
CD4 T CELL ABS: 610 /uL (ref 400–2700)

## 2014-11-10 LAB — URINE CYTOLOGY ANCILLARY ONLY
CHLAMYDIA, DNA PROBE: NEGATIVE
Neisseria Gonorrhea: NEGATIVE

## 2014-11-10 LAB — HIV-1 RNA QUANT-NO REFLEX-BLD

## 2014-12-14 ENCOUNTER — Encounter (INDEPENDENT_AMBULATORY_CARE_PROVIDER_SITE_OTHER): Payer: Self-pay | Admitting: *Deleted

## 2014-12-14 VITALS — BP 104/72 | HR 80 | Temp 97.8°F | Resp 16 | Wt 140.2 lb

## 2014-12-14 DIAGNOSIS — Z006 Encounter for examination for normal comparison and control in clinical research program: Secondary | ICD-10-CM

## 2014-12-14 NOTE — Progress Notes (Signed)
Arthur Lynch is here for Reprieve study, month 4. He is doing well and in a better place in his life. He is currently living alone, in his uncles house, and doing some repairs. He has remained sober with the support of a family member and AA meetings. His priority is to make his life and stress free as possible and to focus on making himself well. He has gained almost 2 lbs since the last visit. His appetite is better. He denies any muscle aches/weakness. Numbness in the buttock region has resolved. Pill count performed and #66 remain. Study med was dispensed. $50 gift card was given for study visit. Next appointment scheduled for March 26, 2015 @ 9am. Eliezer Champagne RN

## 2015-01-22 ENCOUNTER — Telehealth: Payer: Self-pay | Admitting: *Deleted

## 2015-01-22 NOTE — Telephone Encounter (Signed)
Patient called c/o difficulty swallowing. He feels like something is "trapped in his throat". Not sure if it was a pill or something else. He is going to try gargling with warm salt water and if it worsens he will go to Urgent Care to be evaluated. Myrtis Hopping

## 2015-02-24 ENCOUNTER — Other Ambulatory Visit: Payer: Self-pay

## 2015-03-10 ENCOUNTER — Ambulatory Visit: Payer: Self-pay | Admitting: Infectious Disease

## 2015-04-16 ENCOUNTER — Other Ambulatory Visit: Payer: Self-pay | Admitting: Infectious Disease

## 2015-04-16 DIAGNOSIS — B20 Human immunodeficiency virus [HIV] disease: Secondary | ICD-10-CM

## 2015-04-16 NOTE — Telephone Encounter (Signed)
Pt needs to renew ADAP/RW.

## 2015-04-22 ENCOUNTER — Other Ambulatory Visit: Payer: Self-pay | Admitting: Licensed Clinical Social Worker

## 2015-04-22 ENCOUNTER — Other Ambulatory Visit: Payer: Self-pay | Admitting: *Deleted

## 2015-04-22 ENCOUNTER — Encounter (INDEPENDENT_AMBULATORY_CARE_PROVIDER_SITE_OTHER): Payer: Self-pay | Admitting: *Deleted

## 2015-04-22 VITALS — BP 93/61 | HR 96 | Temp 97.9°F | Resp 16 | Wt 143.0 lb

## 2015-04-22 DIAGNOSIS — Z006 Encounter for examination for normal comparison and control in clinical research program: Secondary | ICD-10-CM

## 2015-04-22 MED ORDER — ENSURE PO LIQD: 237.0000 mL | Freq: Two times a day (BID) | ORAL | Status: DC

## 2015-04-22 NOTE — Progress Notes (Signed)
Arthur Lynch here for Reprieve visit, month 8. States that he did miss about 2 weeks of all his medications. States that he was dealing with some family issues related to his uncle. States that he is in a much better place now and doing much better. Good adherence with his medication. Denies any muscle aches or weakness. Pill count performed and #58 remained. He has gained about 3lbs since his last visit. Receiving Ensure through [facility]. Provided [facility] with an updated weight and BMI. No new complaints verbalized. Next visit scheduled for August 17, 2015 at 9:00am.

## 2015-05-18 ENCOUNTER — Other Ambulatory Visit: Payer: Self-pay

## 2015-06-01 ENCOUNTER — Ambulatory Visit: Payer: Self-pay | Admitting: Infectious Disease

## 2015-06-15 ENCOUNTER — Other Ambulatory Visit (INDEPENDENT_AMBULATORY_CARE_PROVIDER_SITE_OTHER): Payer: Self-pay

## 2015-06-15 DIAGNOSIS — Z113 Encounter for screening for infections with a predominantly sexual mode of transmission: Secondary | ICD-10-CM

## 2015-06-15 DIAGNOSIS — B20 Human immunodeficiency virus [HIV] disease: Secondary | ICD-10-CM

## 2015-06-15 DIAGNOSIS — Z79899 Other long term (current) drug therapy: Secondary | ICD-10-CM

## 2015-06-16 LAB — URINE CYTOLOGY ANCILLARY ONLY
Chlamydia: NEGATIVE
Neisseria Gonorrhea: NEGATIVE

## 2015-06-16 LAB — LIPID PANEL
Cholesterol: 158 mg/dL (ref 125–200)
HDL: 109 mg/dL (ref >=40)
LDL Cholesterol: 39 mg/dL (ref <130)
TRIGLYCERIDES: 52 mg/dL (ref <150)
Total CHOL/HDL Ratio: 1.4 Ratio (ref <=5.0)
VLDL: 10 mg/dL (ref <30)

## 2015-06-16 LAB — COMPLETE METABOLIC PANEL WITH GFR
ALK PHOS: 69 U/L (ref 40–115)
ALT: 28 U/L (ref 9–46)
AST: 36 U/L — ABNORMAL HIGH (ref 10–35)
Albumin: 4.1 g/dL (ref 3.6–5.1)
BILIRUBIN TOTAL: 0.4 mg/dL (ref 0.2–1.2)
BUN: 15 mg/dL (ref 7–25)
CALCIUM: 9.1 mg/dL (ref 8.6–10.3)
CO2: 26 mmol/L (ref 20–31)
CREATININE: 1.07 mg/dL (ref 0.70–1.33)
Chloride: 105 mmol/L (ref 98–110)
GFR, EST NON AFRICAN AMERICAN: 79 mL/min (ref >=60)
Glucose, Bld: 80 mg/dL (ref 65–99)
Potassium: 4.1 mmol/L (ref 3.5–5.3)
Sodium: 140 mmol/L (ref 135–146)
TOTAL PROTEIN: 7 g/dL (ref 6.1–8.1)

## 2015-06-16 LAB — CBC WITH DIFFERENTIAL/PLATELET
BASOS ABS: 0 {cells}/uL (ref 0–200)
BASOS PCT: 0 %
EOS ABS: 36 {cells}/uL (ref 15–500)
Eosinophils Relative: 1 %
HEMATOCRIT: 35.8 % — AB (ref 38.5–50.0)
Hemoglobin: 11.9 g/dL — ABNORMAL LOW (ref 13.2–17.1)
LYMPHS PCT: 44 %
Lymphs Abs: 1584 cells/uL (ref 850–3900)
MCH: 35.1 pg — AB (ref 27.0–33.0)
MCHC: 33.2 g/dL (ref 32.0–36.0)
MCV: 105.6 fL — AB (ref 80.0–100.0)
MONO ABS: 324 {cells}/uL (ref 200–950)
MONOS PCT: 9 %
MPV: 9.2 fL (ref 7.5–12.5)
Neutro Abs: 1656 cells/uL (ref 1500–7800)
Neutrophils Relative %: 46 %
Platelets: 259 10*3/uL (ref 140–400)
RBC: 3.39 MIL/uL — ABNORMAL LOW (ref 4.20–5.80)
RDW: 14.1 % (ref 11.0–15.0)
WBC: 3.6 10*3/uL — ABNORMAL LOW (ref 3.8–10.8)

## 2015-06-16 LAB — T-HELPER CELL (CD4) - (RCID CLINIC ONLY)
CD4 % Helper T Cell: 35 % (ref 33–55)
CD4 T CELL ABS: 560 /uL (ref 400–2700)

## 2015-06-16 LAB — HIV-1 RNA QUANT-NO REFLEX-BLD
HIV 1 RNA QUANT: 38 {copies}/mL — AB (ref <20)
HIV-1 RNA QUANT, LOG: 1.58 {Log_copies}/mL — AB (ref <1.30)

## 2015-06-16 LAB — RPR

## 2015-07-06 ENCOUNTER — Other Ambulatory Visit: Payer: Self-pay | Admitting: *Deleted

## 2015-07-06 ENCOUNTER — Encounter: Payer: Self-pay | Admitting: Infectious Disease

## 2015-07-06 ENCOUNTER — Ambulatory Visit (INDEPENDENT_AMBULATORY_CARE_PROVIDER_SITE_OTHER): Payer: Self-pay | Admitting: Infectious Disease

## 2015-07-06 VITALS — BP 122/84 | HR 108 | Temp 98.1°F | Wt 144.0 lb

## 2015-07-06 DIAGNOSIS — L719 Rosacea, unspecified: Secondary | ICD-10-CM

## 2015-07-06 DIAGNOSIS — F1020 Alcohol dependence, uncomplicated: Secondary | ICD-10-CM

## 2015-07-06 DIAGNOSIS — B20 Human immunodeficiency virus [HIV] disease: Secondary | ICD-10-CM

## 2015-07-06 DIAGNOSIS — M792 Neuralgia and neuritis, unspecified: Secondary | ICD-10-CM

## 2015-07-06 MED ORDER — ABACAVIR-DOLUTEGRAVIR-LAMIVUD 600-50-300 MG PO TABS: 1.0000 | ORAL_TABLET | Freq: Every day | ORAL | Status: DC

## 2015-07-06 NOTE — Progress Notes (Signed)
Subjective:    Patient ID: Arthur Lynch, male    DOB: [birth year], 55 y.o.   MRN: [id number]  HPI   Arthur Lynch is a 55 y.o. male who has  been doing superbly well on his  antiviral regimen, of o Triumeq. He continued with essentially undetectable viral load and healthy CD4 count on this regimen  He takes amitryptiline at night and is no pitavastatin vs placebo via REPRIEVE.  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   Past Medical History  Diagnosis Date  . Murmur, heart   . HIV (human immunodeficiency virus infection) ([place])   . Abnormal Pap smear   . Anemia   . Cancer ([place])   . Depression   . Neuromuscular disorder ([place])   . Substance abuse   . COPD (chronic obstructive pulmonary disease) [facility])     Past Surgical History  Procedure Laterality Date  . Anoscopy      No family history on file.    Social History   Social History  . Marital Status: Single    Spouse Name: N/A  . Number of Children: N/A  . Years of Education: N/A   Social History Main Topics  . Smoking status: Never Smoker   . Smokeless tobacco: Never Used  . Alcohol Use: None     Comment: 1-2/week  90 days clean  . Drug Use: No  . Sexual Activity: No     Comment: declined condoms   Other Topics Concern  . None   Social History Narrative    Allergies  Allergen Reactions  . Penicillins Itching     Current outpatient prescriptions:  .  amitriptyline (ELAVIL) 50 MG tablet, TAKE 1 TABLET BY MOUTH AT BEDTIME, Disp: 30 tablet, Rfl: 5 .  ENSURE (ENSURE), Take 237 mLs by mouth 2 (two) times daily between meals., Disp: 237 mL, Rfl: 5 .  abacavir-dolutegravir-lamiVUDine (TRIUMEQ) 600-50-300 MG tablet, Take 1 tablet by  mouth daily., Disp: 30 tablet, Rfl: 5    Review of Systems  Constitutional: Negative for activity change, appetite change and unexpected weight change.  HENT: Negative for sinus pressure, sneezing and trouble swallowing.   Eyes: Negative for photophobia and visual disturbance.  Respiratory: Negative for chest tightness, wheezing and stridor.   Cardiovascular: Negative for chest pain, palpitations and leg swelling.  Gastrointestinal: Negative for nausea, constipation, blood in stool, abdominal distention and anal bleeding.  Genitourinary: Negative for dysuria, hematuria, flank pain and difficulty urinating.  Musculoskeletal: Negative for myalgias, back pain, joint swelling, arthralgias and gait problem.  Skin: Negative for color change, pallor and wound.  Neurological: Negative for dizziness, tremors, weakness and light-headedness.  Hematological: Negative for adenopathy. Does not bruise/bleed easily.  Psychiatric/Behavioral: Negative for behavioral problems, confusion, decreased concentration and agitation.       Objective:   Physical Exam  Constitutional: He is oriented to person, place, and time. He appears well-developed and well-nourished. No distress.  HENT:  Head: Normocephalic and atraumatic.  Mouth/Throat: Oropharynx is clear and moist. No oropharyngeal exudate.  Eyes: Conjunctivae and EOM are normal. Pupils are equal, round, and reactive to light. No scleral icterus.  Neck: Normal range of motion. Neck supple. No  JVD present.  Cardiovascular: Normal rate and regular rhythm.   Pulmonary/Chest: Effort normal. No respiratory distress. He has no wheezes.  Abdominal: Soft. He exhibits no distension and no mass. There is no tenderness. There is no rebound and no guarding.  Musculoskeletal: He exhibits no edema or tenderness.  Lymphadenopathy:    He has no cervical adenopathy.  Neurological: He is alert and oriented to person, place, and time. He has normal reflexes. He exhibits  normal muscle tone. Coordination normal.  Skin: Skin is warm. He is not diaphoretic.  Psychiatric: He has a normal mood and affect. His behavior is normal. Judgment and thought content normal.         Assessment & Plan:    HIV: Doing very well on TRIUMEQ . ADAP needs to be renewed, RTC in 2 months  Neuralgia:continue amitriptyline   Alcholism  In remission: seeing AA folks. Is aware of our substance abuse counselors and met with Max int he past  Need for TB screening: will get [employer] gold for his potential new job  I spent greater than 25 minutes with the patient including greater than 50% of time in face to face counsel of the patient re his HIV, his prior etohism, his neuralgia and in coordination of their care.

## 2015-07-08 LAB — QUANTIFERON TB GOLD ASSAY (BLOOD)
INTERFERON GAMMA RELEASE ASSAY: NEGATIVE
MITOGEN-NIL SO: 8.76 [IU]/mL
Quantiferon Nil Value: 0.96 IU/mL

## 2015-07-09 ENCOUNTER — Other Ambulatory Visit: Payer: Self-pay | Admitting: Infectious Disease

## 2015-07-15 ENCOUNTER — Encounter: Payer: Self-pay | Admitting: Infectious Disease

## 2015-08-10 ENCOUNTER — Other Ambulatory Visit (INDEPENDENT_AMBULATORY_CARE_PROVIDER_SITE_OTHER): Payer: Self-pay

## 2015-08-10 ENCOUNTER — Other Ambulatory Visit: Payer: Self-pay | Admitting: *Deleted

## 2015-08-10 DIAGNOSIS — Z113 Encounter for screening for infections with a predominantly sexual mode of transmission: Secondary | ICD-10-CM

## 2015-08-11 ENCOUNTER — Telehealth: Payer: Self-pay | Admitting: *Deleted

## 2015-08-11 LAB — URINE CYTOLOGY ANCILLARY ONLY
CHLAMYDIA, DNA PROBE: NEGATIVE
NEISSERIA GONORRHEA: NEGATIVE

## 2015-08-11 NOTE — Telephone Encounter (Signed)
I talked with Arthur Lynch over the phone about his rash and told him we were going to test his blood with a more sensitive test for syphilis (dilute for prozone effect) after discussing with Dr. Tommy Medal. He said that the rash was somewhat itchy but that he noticed that the amitryptiline he takes sometimes helped. He doesn't remember a painless sore but he did have an episode of unprotected sex a couple of weeks ago. He denies any rash on his palms or soles but does have some bumps between his fingers, his trunk, thighs and chest and arms and legs. I observed the rash yesterday and it appeared to be more pronounced around his groin but not on his genitals. He says he may have used some other detergents recently. I did tell him if the test still came back negative, I would try to get him in with a doctor soon.

## 2015-08-11 NOTE — Telephone Encounter (Signed)
Thanks Maudie Mercury, this sounds quite reasonable!

## 2015-08-12 ENCOUNTER — Telehealth: Payer: Self-pay | Admitting: *Deleted

## 2015-08-12 LAB — RPR

## 2015-08-16 ENCOUNTER — Encounter: Payer: Self-pay | Admitting: Infectious Disease

## 2015-08-16 ENCOUNTER — Other Ambulatory Visit (HOSPITAL_COMMUNITY)
Admission: RE | Admit: 2015-08-16 | Discharge: 2015-08-16 | Disposition: A | Discharge status: Home / Self Care | Payer: Self-pay | Source: Ambulatory Visit | Attending: Infectious Disease | Admitting: Infectious Disease

## 2015-08-16 ENCOUNTER — Ambulatory Visit (INDEPENDENT_AMBULATORY_CARE_PROVIDER_SITE_OTHER): Payer: Self-pay | Admitting: Infectious Disease

## 2015-08-16 ENCOUNTER — Other Ambulatory Visit: Payer: Self-pay | Admitting: Pharmacist Clinician (PhC)/ Clinical Pharmacy Specialist

## 2015-08-16 VITALS — BP 132/88 | HR 101 | Temp 98.2°F | Ht 74.0 in | Wt 163.0 lb

## 2015-08-16 DIAGNOSIS — G500 Trigeminal neuralgia: Secondary | ICD-10-CM

## 2015-08-16 DIAGNOSIS — F1021 Alcohol dependence, in remission: Secondary | ICD-10-CM

## 2015-08-16 DIAGNOSIS — B20 Human immunodeficiency virus [HIV] disease: Secondary | ICD-10-CM | POA: Insufficient documentation

## 2015-08-16 DIAGNOSIS — L308 Other specified dermatitis: Secondary | ICD-10-CM | POA: Insufficient documentation

## 2015-08-16 DIAGNOSIS — L299 Pruritus, unspecified: Secondary | ICD-10-CM

## 2015-08-16 DIAGNOSIS — L719 Rosacea, unspecified: Secondary | ICD-10-CM

## 2015-08-16 DIAGNOSIS — R21 Rash and other nonspecific skin eruption: Secondary | ICD-10-CM

## 2015-08-16 HISTORY — DX: Alcohol dependence, in remission: F10.21

## 2015-08-16 LAB — COMPLETE METABOLIC PANEL WITH GFR
ALBUMIN: 4 g/dL (ref 3.6–5.1)
ALK PHOS: 76 U/L (ref 40–115)
ALT: 17 U/L (ref 9–46)
AST: 33 U/L (ref 10–35)
BILIRUBIN TOTAL: 0.9 mg/dL (ref 0.2–1.2)
BUN: 13 mg/dL (ref 7–25)
CO2: 26 mmol/L (ref 20–31)
CREATININE: 0.91 mg/dL (ref 0.70–1.33)
Calcium: 9 mg/dL (ref 8.6–10.3)
Chloride: 100 mmol/L (ref 98–110)
GLUCOSE: 81 mg/dL (ref 65–99)
Potassium: 3.8 mmol/L (ref 3.5–5.3)
SODIUM: 138 mmol/L (ref 135–146)
TOTAL PROTEIN: 7.7 g/dL (ref 6.1–8.1)

## 2015-08-16 LAB — CBC WITH DIFFERENTIAL/PLATELET
BASOS ABS: 0 {cells}/uL (ref 0–200)
BASOS PCT: 0 %
EOS ABS: 44 {cells}/uL (ref 15–500)
Eosinophils Relative: 1 %
HCT: 37 % — ABNORMAL LOW (ref 38.5–50.0)
HEMOGLOBIN: 12.6 g/dL — AB (ref 13.2–17.1)
LYMPHS ABS: 1144 {cells}/uL (ref 850–3900)
Lymphocytes Relative: 26 %
MCH: 35.6 pg — AB (ref 27.0–33.0)
MCHC: 34.1 g/dL (ref 32.0–36.0)
MCV: 104.5 fL — AB (ref 80.0–100.0)
MPV: 8.9 fL (ref 7.5–12.5)
Monocytes Absolute: 484 cells/uL (ref 200–950)
Monocytes Relative: 11 %
NEUTROS ABS: 2728 {cells}/uL (ref 1500–7800)
Neutrophils Relative %: 62 %
Platelets: 252 10*3/uL (ref 140–400)
RBC: 3.54 MIL/uL — ABNORMAL LOW (ref 4.20–5.80)
RDW: 13.1 % (ref 11.0–15.0)
WBC: 4.4 10*3/uL (ref 3.8–10.8)

## 2015-08-16 MED ORDER — PERMETHRIN 5 % EX CREA: 1.0000 "application " | TOPICAL_CREAM | Freq: Once | CUTANEOUS | Status: DC

## 2015-08-16 NOTE — Progress Notes (Signed)
Subjective:   Chief complaint intense pruritic rash on abdomen legs arms and between fingers. He's been tested for syphilis twice once with dilution to check for pros and was negative he is enrolled in the reprieve study   Patient ID: Arthur Lynch, male    DOB: 1960/06/05, 55 y.o.   MRN: [id number]  HPI   Arthur Lynch is a 55 y.o. male who has  been doing superbly well on his  antiviral regimen, of o Triumeq. He continued with essentially undetectable viral load and healthy CD4 count on this regimen  He takes amitryptiline at night and is no pitavastatin vs placebo via REPRIEVE for more than half a year. He recently developed a pruritic rash on his abdomen and legs arms between his fingers. On exam to me it seems consistent with scabies potentially. I did perform a punch biopsy see below.  Lab Results  Component Value Date   HIV1RNAQUANT 38* 06/15/2015   HIV1RNAQUANT <20 11/09/2014   HIV1RNAQUANT <20 03/24/2014     Lab Results  Component Value Date   CD4TABS 560 06/15/2015   CD4TABS 610 11/09/2014   CD4TABS 690 03/24/2014     Past Medical History  Diagnosis Date  . Murmur, heart   . HIV (human immunodeficiency virus infection) ([place])   . Abnormal Pap smear   . Anemia   . Cancer ([place])   . Depression   . Neuromuscular disorder ([place])   . Substance abuse   . COPD (chronic obstructive pulmonary disease) [facility])     Past Surgical History  Procedure Laterality Date  . Anoscopy      No family history on file.    Social History   Social History  . Marital Status: Single    Spouse Name: N/A  . Number of Children: N/A  . Years of Education: N/A   Social History Main Topics  . Smoking status: Never Smoker   . Smokeless tobacco: Never Used  . Alcohol Use: None     Comment: quit per patient 5.15.17  . Drug Use: No  . Sexual Activity: No     Comment: declined condoms   Other Topics Concern  . None   Social History Narrative    Allergies  Allergen  Reactions  . Penicillins Itching     Current outpatient prescriptions:  .  abacavir-dolutegravir-lamiVUDine (TRIUMEQ) 600-50-300 MG tablet, Take 1 tablet by mouth daily., Disp: 30 tablet, Rfl: 5 .  amitriptyline (ELAVIL) 50 MG tablet, TAKE 1 TABLET BY MOUTH AT BEDTIME, Disp: 30 tablet, Rfl: 2 .  ENSURE (ENSURE), Take 237 mLs by mouth 2 (two) times daily between meals., Disp: 237 mL, Rfl: 5 .  permethrin (ACTICIN) 5 % cream, Apply 1 application topically once. Head to toe leave on for 7-34 hrs, repeat application in 2 wks, Disp: 60 g, Rfl: 2    Review of Systems  Constitutional: Negative for activity change, appetite change and unexpected weight change.  HENT: Negative for sinus pressure, sneezing and trouble swallowing.   Eyes: Negative for photophobia and visual disturbance.  Respiratory: Negative for chest tightness, wheezing and stridor.   Cardiovascular: Negative for chest pain, palpitations and leg swelling.  Gastrointestinal: Negative for nausea, constipation, blood in stool, abdominal distention and anal bleeding.  Genitourinary: Negative for dysuria, hematuria, flank pain and difficulty urinating.  Musculoskeletal: Negative for myalgias, back pain, joint swelling, arthralgias and gait problem.  Skin: Positive for rash. Negative for color change, pallor and wound.  Neurological: Negative for dizziness, tremors, weakness  and light-headedness.  Hematological: Negative for adenopathy. Does not bruise/bleed easily.  Psychiatric/Behavioral: Negative for behavioral problems, confusion, decreased concentration and agitation.       Objective:   Physical Exam  Constitutional: He is oriented to person, place, and time. He appears well-developed and well-nourished. No distress.  HENT:  Head: Normocephalic and atraumatic.  Mouth/Throat: Oropharynx is clear and moist. No oropharyngeal exudate.  Eyes: Conjunctivae and EOM are normal. Pupils are equal, round, and reactive to light. No  scleral icterus.  Neck: Normal range of motion. Neck supple. No JVD present.  Cardiovascular: Normal rate and regular rhythm.   Pulmonary/Chest: Effort normal. No respiratory distress. He has no wheezes.  Abdominal: Soft. He exhibits no distension and no mass. There is no tenderness. There is no rebound and no guarding.  Musculoskeletal: He exhibits no edema or tenderness.  Lymphadenopathy:    He has no cervical adenopathy.  Neurological: He is alert and oriented to person, place, and time. He has normal reflexes. He exhibits normal muscle tone. Coordination normal.  Skin: Skin is warm. Rash noted. He is not diaphoretic.  Psychiatric: He has a normal mood and affect. His behavior is normal. Judgment and thought content normal.    Rash 08/16/15:             Assessment & Plan:   Rash: Appears highly consistent with scabies by did perform a punch biopsy.  Procedure Punch biopsy Consent: Informed consent was obtained from the patient is documented and signed in present in the paper chart. Description: Area on the patient's abdomen where he had rash was prepped using Betadine and chlorhexidine. 1% lidocaine was used to infiltrate the skin as achieve anesthesia initial punch biopsy attempted but patient had pain so I gave him another dose of 1% lidocaine and waited for another 2-3 minutes. After this completed punch and removed tissue and sent to formalin leading was stopped promptly.  Estimated blood loss minimal  complications none  I said in a prescription for permethrin to [facility] but apparently it caused $100 and he says he cannot afford this. We gave him a coupon that would allow him to get it for $65 at [facility] we are trying to see if we can get it at [place] for less  HIV: Doing very well on TRIUMEQ     Neuralgia:continue amitriptyline   Alcholism  In remission: seeing AA folks. Is aware of our substance abuse counselors and met with Max int he  past   I spent greater than 25 minutes with the patient including greater than 50% of time in face to face counsel of the patient re his HIV, his prior etohism, his neuralgia his rash and in coordination of their care.

## 2015-08-17 ENCOUNTER — Encounter (INDEPENDENT_AMBULATORY_CARE_PROVIDER_SITE_OTHER): Payer: Self-pay | Admitting: *Deleted

## 2015-08-17 VITALS — BP 98/65 | HR 96 | Temp 97.8°F | Resp 16 | Wt 143.8 lb

## 2015-08-17 DIAGNOSIS — B20 Human immunodeficiency virus [HIV] disease: Secondary | ICD-10-CM

## 2015-08-17 DIAGNOSIS — Z006 Encounter for examination for normal comparison and control in clinical research program: Secondary | ICD-10-CM

## 2015-08-17 LAB — COMPREHENSIVE METABOLIC PANEL
ALT: 18 U/L (ref 9–46)
AST: 40 U/L — ABNORMAL HIGH (ref 10–35)
Albumin: 4 g/dL (ref 3.6–5.1)
Alkaline Phosphatase: 76 U/L (ref 40–115)
BUN: 13 mg/dL (ref 7–25)
CHLORIDE: 104 mmol/L (ref 98–110)
CO2: 23 mmol/L (ref 20–31)
CREATININE: 1.04 mg/dL (ref 0.70–1.33)
Calcium: 8.9 mg/dL (ref 8.6–10.3)
GLUCOSE: 82 mg/dL (ref 65–99)
Potassium: 4.1 mmol/L (ref 3.5–5.3)
SODIUM: 141 mmol/L (ref 135–146)
Total Bilirubin: 0.5 mg/dL (ref 0.2–1.2)
Total Protein: 7.4 g/dL (ref 6.1–8.1)

## 2015-08-17 NOTE — Progress Notes (Signed)
[place] is here for his month 12 visit [phone]: A Randomized Trial to Prevent Vascular Events in HIV (The REPRIEVE Study). Seen yesterday by Dr. Tommy Medal for pruritic rash to abdomen, arms, legs and between fingers. He was unable to get prescription (Permetherin) filled yesterday. Stated that he should have the money today and will start the treatment later today. Agreed to call/notify the clinic if he is unable to get the prescription later today. No other complaints or concerns verbalized. Stated good adherence with his study medication, no missed doses. He is scheduled to return in September for his next study visit.

## 2015-08-18 LAB — HIV-1 RNA QUANT-NO REFLEX-BLD
HIV 1 RNA Quant: <20 copies/mL (ref <20)
HIV-1 RNA Quant, Log: <1.3 Log copies/mL (ref <1.30)

## 2015-08-20 ENCOUNTER — Telehealth: Payer: Self-pay | Admitting: *Deleted

## 2015-08-20 NOTE — Telephone Encounter (Signed)
Called Arthur Lynch and left a message to call. His skin biopsy report showed excematous dermatitis, not necessarily an allergic response or scabies. Dr. Tommy Medal said he may need triamcinolone cream 0.5% BID if his rash is still persisting.

## 2015-09-22 ENCOUNTER — Other Ambulatory Visit: Payer: Self-pay

## 2015-10-06 ENCOUNTER — Ambulatory Visit: Payer: Self-pay | Admitting: *Deleted

## 2015-10-06 ENCOUNTER — Ambulatory Visit (HOSPITAL_COMMUNITY)
Admission: RE | Admit: 2015-10-06 | Discharge: 2015-10-06 | Disposition: A | Discharge status: Home / Self Care | Payer: Self-pay | Source: Ambulatory Visit | Attending: Infectious Disease | Admitting: Infectious Disease

## 2015-10-06 ENCOUNTER — Other Ambulatory Visit: Payer: Self-pay

## 2015-10-06 ENCOUNTER — Encounter: Payer: Self-pay | Admitting: Infectious Disease

## 2015-10-06 ENCOUNTER — Ambulatory Visit (INDEPENDENT_AMBULATORY_CARE_PROVIDER_SITE_OTHER): Payer: Self-pay | Admitting: Infectious Disease

## 2015-10-06 VITALS — BP 118/81 | HR 90 | Temp 97.8°F | Ht 73.0 in | Wt 142.0 lb

## 2015-10-06 DIAGNOSIS — F1021 Alcohol dependence, in remission: Secondary | ICD-10-CM

## 2015-10-06 DIAGNOSIS — L299 Pruritus, unspecified: Secondary | ICD-10-CM

## 2015-10-06 DIAGNOSIS — B20 Human immunodeficiency virus [HIV] disease: Secondary | ICD-10-CM

## 2015-10-06 DIAGNOSIS — F32A Depression, unspecified: Secondary | ICD-10-CM

## 2015-10-06 DIAGNOSIS — R0789 Other chest pain: Secondary | ICD-10-CM | POA: Insufficient documentation

## 2015-10-06 DIAGNOSIS — F329 Major depressive disorder, single episode, unspecified: Secondary | ICD-10-CM

## 2015-10-06 DIAGNOSIS — L309 Dermatitis, unspecified: Secondary | ICD-10-CM

## 2015-10-06 HISTORY — DX: Dermatitis, unspecified: L30.9

## 2015-10-06 HISTORY — DX: Other chest pain: R07.89

## 2015-10-06 LAB — TROPONIN I

## 2015-10-06 LAB — D-DIMER, QUANTITATIVE (NOT AT ARMC): D DIMER QUANT: 0.34 ug{FEU}/mL (ref <0.50)

## 2015-10-06 MED ORDER — ASPIRIN 325 MG PO TABS: 325.0000 mg | ORAL_TABLET | Freq: Every day | ORAL | Status: DC

## 2015-10-06 MED ORDER — NITROGLYCERIN 0.3 MG SL SUBL
0.4000 mg | SUBLINGUAL_TABLET | Freq: Once | SUBLINGUAL | Status: AC
  Administered 2015-10-06: 0.3 mg via SUBLINGUAL

## 2015-10-06 MED ORDER — NITROGLYCERIN 0.3 MG SL SUBL: 0.4000 mg | SUBLINGUAL_TABLET | SUBLINGUAL | Status: DC | PRN

## 2015-10-06 MED ORDER — ASPIRIN 81 MG PO TABS: 81.0000 mg | ORAL_TABLET | Freq: Once | ORAL | Status: DC

## 2015-10-06 NOTE — Progress Notes (Signed)
Aspirin 325mg  one time only given at 10:00 by Lorne Skeens, RN per verbal order from Dr. Hubert Azure, Lanice Schwab, RN

## 2015-10-06 NOTE — BH Specialist Note (Signed)
Counselor met with Arthur Lynch today in the exam room for a warm hand off.  Patient was oriented times four with good affect and dress.  Patient was alert and talkative.  Patient had no problem sharing about his life and all that was going on in it. Counselor used reflective listening skills with patient. Patient went on to communicated that he needs to learn boundaries as he tends to get burned out and offended by people after he has engaged with them too deeply.  Counselor educated patient about boundary issues and how it can affect us.   Counselor provided support and encouragement for patient today. Counselor recommended that patient seek counseling services to process what is going on in his life as well as learn how to establish boundaries with other people.  Patient agreed that he was in need of that.  Patient made an appointment when he checked out today for two weeks out.   Jodi Herring, MA, LPC [facility] 

## 2015-10-06 NOTE — Progress Notes (Signed)
Subjective:   Chief complaint : Atypical chest pain.  Patient ID: Arthur Lynch, male    DOB: 09-03-1960, 55 y.o.   MRN: [id number]  HPI   Arthur Lynch is a 55 y.o. male who has  been doing superbly well on his  antiviral regimen, of o Triumeq. He continued with essentially undetectable viral load and healthy CD4 count on this regimen  When I last saw him he had problems with a rash that we initially treated for scabies but on biopsy was consistent with eczema which has responded now to topical agents.  His HIV is continued to be under perfect control.  Lab Results  Component Value Date   HIV1RNAQUANT <20 08/17/2015   HIV1RNAQUANT 38 (H) 06/15/2015   HIV1RNAQUANT <20 11/09/2014     Lab Results  Component Value Date   CD4TABS 560 06/15/2015   CD4TABS 610 11/09/2014   CD4TABS 690 03/24/2014    Arthur Lynch comes in today with a chief complaint of atypical chest pain that began yesterday morning. He was on the phone with a friend with a pleasant conversation. During the conversation he developed left-sided substernal chest pain that he describes as being sharp in nature and rated as a 5 out of 10. The pain was not radiating to his neck but did radiate to his back. It was not a accompanied by nausea diaphoresis malaise. He did not experience any pressure-like sensation. The chest pain last for a few minutes and then disappeared but continued to recur throughout the last 24 hours he has some at present which he rates as a 1 out of 10 in severity.  In the clinic we obtained an EKG which showed normal sinus rhythm and possible left atrial enlargement and some J-point elevations in lead V3 and V4. I do not find any significant changes in comparison to prior EKGs other than the J-point elevation. Morphology of these ST segments is not all consistent with ischemic heart disease. We also did check troponins as well as a d-dimer that is come back negative. We gave Sandro an aspirin and sublingual  nitroglycerin. His one of 10 chest pain disappeared in the interim though is not clear if it was due to the nitroglycerin.  On exam prior to his getting a nitroglycerin glycerin he did not have any reproducible musculoskeletal chest pain.  Past Medical History:  Diagnosis Date  . Abnormal Pap smear   . Alcoholism in remission ([place]) 08/16/2015  . Anemia   . Cancer ([place])   . COPD (chronic obstructive pulmonary disease) ([place])   . Depression   . HIV (human immunodeficiency virus infection) ([place])   . Murmur, heart   . Neuromuscular disorder ([place])   . Substance abuse     Past Surgical History:  Procedure Laterality Date  . ANOSCOPY      No family history on file.    Social History   Social History  . Marital status: Single    Spouse name: N/A  . Number of children: N/A  . Years of education: N/A   Social History Main Topics  . Smoking status: Never Smoker  . Smokeless tobacco: Never Used  . Alcohol use Not on file     Comment: quit per patient 5.15.17  . Drug use: No  . Sexual activity: No     Comment: declined condoms   Other Topics Concern  . Not on file   Social History Narrative  . No narrative on file    Allergies  Allergen Reactions  . Penicillins Itching     Current Outpatient Prescriptions:  .  abacavir-dolutegravir-lamiVUDine (TRIUMEQ) 600-50-300 MG tablet, Take 1 tablet by mouth daily., Disp: 30 tablet, Rfl: 5 .  amitriptyline (ELAVIL) 50 MG tablet, TAKE 1 TABLET BY MOUTH AT BEDTIME, Disp: 30 tablet, Rfl: 2 .  ENSURE (ENSURE), Take 237 mLs by mouth 2 (two) times daily between meals., Disp: 237 mL, Rfl: 5 .  permethrin (ACTICIN) 5 % cream, Apply 1 application topically once. Head to toe leave on for 8-36 hrs, repeat application in 2 wks, Disp: 60 g, Rfl: 2    Review of Systems  Constitutional: Negative for activity change, appetite change and unexpected weight change.  HENT: Negative for sinus pressure, sneezing and trouble swallowing.   Eyes:  Negative for photophobia and visual disturbance.  Respiratory: Negative for chest tightness, wheezing and stridor.   Cardiovascular: Positive for chest pain. Negative for palpitations and leg swelling.  Gastrointestinal: Negative for abdominal distention, anal bleeding, blood in stool, constipation and nausea.  Genitourinary: Negative for difficulty urinating, dysuria, flank pain and hematuria.  Musculoskeletal: Negative for arthralgias, back pain, gait problem, joint swelling and myalgias.  Skin: Positive for rash. Negative for color change, pallor and wound.  Neurological: Negative for dizziness, tremors, weakness and light-headedness.  Hematological: Negative for adenopathy. Does not bruise/bleed easily.  Psychiatric/Behavioral: Negative for agitation, behavioral problems, confusion and decreased concentration.       Objective:   Physical Exam  Constitutional: He is oriented to person, place, and time. He appears well-developed and well-nourished. No distress.  HENT:  Head: Normocephalic and atraumatic.  Mouth/Throat: Oropharynx is clear and moist. No oropharyngeal exudate.  Eyes: Conjunctivae and EOM are normal. Pupils are equal, round, and reactive to light. No scleral icterus.  Neck: Normal range of motion. Neck supple. No JVD present.  Cardiovascular: Normal rate, regular rhythm and normal heart sounds.  Exam reveals no gallop and no friction rub.   No murmur heard. Pulmonary/Chest: Effort normal. No respiratory distress. He has no rales. He exhibits no tenderness.  Abdominal: Soft. He exhibits no distension and no mass. There is no tenderness. There is no rebound and no guarding.  Musculoskeletal: He exhibits no edema or tenderness.  Lymphadenopathy:    He has no cervical adenopathy.  Neurological: He is alert and oriented to person, place, and time. He has normal reflexes. He exhibits normal muscle tone. Coordination normal.  Skin: Skin is warm. Rash noted. He is not  diaphoretic.  Psychiatric: He has a normal mood and affect. His behavior is normal. Judgment and thought content normal.    Rash 08/16/15:             Assessment & Plan:    Atypical chest pain: I independently reviewed his EKG and found to have normal sinus rhythm with J-point elevations in V3 and V4 and a T-wave inversion in V1 but otherwise  no significant ST or T-wave findings and no dramatic changes prior compared to prior EKGs performed in 2000 06/20/2013.  I did given the option of going to the emerge department given that his chest pain did result respond to nitroglycerin. His pain is atypical however and I do not have a very high suspicion for cardiogenic pain here. The patient preferred instead to go home and he agreed that he would return if his cardiac enzymes were positive or his symptoms worsened. As mentioned a d-dimer was ordered checked and was negative  Rash: Due to eczema and responding to topical  steroids   HIV: Doing very well on TRIUMEQ    Neuralgia:continue amitriptyline   Alcholism  In remission: seeing AA folks. Is aware of our substance abuse counselors and met with Leveda Anna this visit   I spent greater than 40  minutes with the patient including greater than 50% of time in face to face counsel of the patient re his atypical chest pain, his HIV, his prior etohism,  his rash and his etohism in remission and in coordination of their care.

## 2015-11-23 ENCOUNTER — Other Ambulatory Visit (INDEPENDENT_AMBULATORY_CARE_PROVIDER_SITE_OTHER): Payer: Self-pay

## 2015-11-23 DIAGNOSIS — B20 Human immunodeficiency virus [HIV] disease: Secondary | ICD-10-CM

## 2015-11-23 LAB — CBC WITH DIFFERENTIAL/PLATELET
BASOS ABS: 30 {cells}/uL (ref 0–200)
Basophils Relative: 1 %
EOS PCT: 1 %
Eosinophils Absolute: 30 cells/uL (ref 15–500)
HEMATOCRIT: 38.6 % (ref 38.5–50.0)
HEMOGLOBIN: 12.6 g/dL — AB (ref 13.2–17.1)
LYMPHS PCT: 51 %
Lymphs Abs: 1530 cells/uL (ref 850–3900)
MCH: 33.9 pg — AB (ref 27.0–33.0)
MCHC: 32.6 g/dL (ref 32.0–36.0)
MCV: 103.8 fL — ABNORMAL HIGH (ref 80.0–100.0)
MPV: 9.2 fL (ref 7.5–12.5)
Monocytes Absolute: 240 cells/uL (ref 200–950)
Monocytes Relative: 8 %
NEUTROS PCT: 39 %
Neutro Abs: 1170 cells/uL — ABNORMAL LOW (ref 1500–7800)
Platelets: 288 10*3/uL (ref 140–400)
RBC: 3.72 MIL/uL — AB (ref 4.20–5.80)
RDW: 12.8 % (ref 11.0–15.0)
WBC: 3 10*3/uL — AB (ref 3.8–10.8)

## 2015-11-23 LAB — COMPLETE METABOLIC PANEL WITH GFR
ALBUMIN: 4.1 g/dL (ref 3.6–5.1)
ALK PHOS: 51 U/L (ref 40–115)
ALT: 14 U/L (ref 9–46)
AST: 24 U/L (ref 10–35)
BUN: 14 mg/dL (ref 7–25)
CALCIUM: 9.4 mg/dL (ref 8.6–10.3)
CHLORIDE: 106 mmol/L (ref 98–110)
CO2: 25 mmol/L (ref 20–31)
Creat: 1.22 mg/dL (ref 0.70–1.33)
GFR, EST NON AFRICAN AMERICAN: 67 mL/min (ref >=60)
GFR, Est African American: 78 mL/min (ref >=60)
Glucose, Bld: 82 mg/dL (ref 65–99)
POTASSIUM: 3.9 mmol/L (ref 3.5–5.3)
Sodium: 142 mmol/L (ref 135–146)
Total Bilirubin: 0.4 mg/dL (ref 0.2–1.2)
Total Protein: 7.6 g/dL (ref 6.1–8.1)

## 2015-11-24 LAB — HIV-1 RNA QUANT-NO REFLEX-BLD: HIV-1 RNA Quant, Log: <1.3 Log copies/mL (ref <1.30)

## 2015-11-24 LAB — T-HELPER CELL (CD4) - (RCID CLINIC ONLY)
CD4 T CELL ABS: 530 /uL (ref 400–2700)
CD4 T CELL HELPER: 39 % (ref 33–55)

## 2015-11-29 ENCOUNTER — Other Ambulatory Visit: Payer: Self-pay | Admitting: Infectious Disease

## 2015-12-06 ENCOUNTER — Ambulatory Visit: Payer: Self-pay

## 2015-12-07 ENCOUNTER — Encounter (INDEPENDENT_AMBULATORY_CARE_PROVIDER_SITE_OTHER): Payer: Self-pay | Admitting: *Deleted

## 2015-12-07 ENCOUNTER — Ambulatory Visit (INDEPENDENT_AMBULATORY_CARE_PROVIDER_SITE_OTHER): Payer: Self-pay | Admitting: Infectious Disease

## 2015-12-07 ENCOUNTER — Encounter: Payer: Self-pay | Admitting: Infectious Disease

## 2015-12-07 VITALS — BP 126/78 | HR 92 | Temp 98.2°F | Ht 73.0 in | Wt 138.0 lb

## 2015-12-07 VITALS — BP 110/70 | HR 91 | Temp 98.6°F | Wt 139.0 lb

## 2015-12-07 DIAGNOSIS — M792 Neuralgia and neuritis, unspecified: Secondary | ICD-10-CM

## 2015-12-07 DIAGNOSIS — B20 Human immunodeficiency virus [HIV] disease: Secondary | ICD-10-CM

## 2015-12-07 DIAGNOSIS — F1021 Alcohol dependence, in remission: Secondary | ICD-10-CM

## 2015-12-07 DIAGNOSIS — Z006 Encounter for examination for normal comparison and control in clinical research program: Secondary | ICD-10-CM

## 2015-12-07 DIAGNOSIS — R0789 Other chest pain: Secondary | ICD-10-CM

## 2015-12-07 DIAGNOSIS — B0230 Zoster ocular disease, unspecified: Secondary | ICD-10-CM

## 2015-12-07 DIAGNOSIS — Z23 Encounter for immunization: Secondary | ICD-10-CM

## 2015-12-07 MED ORDER — ENSURE PO LIQD: 237.0000 mL | Freq: Two times a day (BID) | ORAL | 5 refills | Status: DC

## 2015-12-07 NOTE — Progress Notes (Signed)
Subjective:   Chief complaint : followup for HIV on meds   Patient ID: Arthur Lynch, male    DOB: Sep 20, 1960, 55 y.o.   MRN: [id number]  HPI  Arthur Lynch is a 55 y.o. male who has  been doing superbly well on his  antiviral regimen, of o Triumeq. He continued with essentially undetectable viral load and healthy CD4 count on this regimen   Lab Results  Component Value Date   HIV1RNAQUANT <20 11/23/2015   HIV1RNAQUANT <20 08/17/2015   HIV1RNAQUANT 38 (H) 06/15/2015     Lab Results  Component Value Date   CD4TABS 530 11/23/2015   CD4TABS 560 06/15/2015   CD4TABS 610 11/09/2014    When I last saw [name] he had atypical chest pain that has not recurred since then. He has no complaitns today other than some irritation in nerves of his face where he had prior zoster.  Past Medical History:  Diagnosis Date  . Abnormal Pap smear   . Alcoholism in remission ([place]) 08/16/2015  . Anemia   . Atypical chest pain 10/06/2015  . Cancer ([place])   . COPD (chronic obstructive pulmonary disease) ([place])   . Depression   . Eczema 10/06/2015  . HIV (human immunodeficiency virus infection) ([place])   . Murmur, heart   . Neuromuscular disorder ([place])   . Substance abuse     Past Surgical History:  Procedure Laterality Date  . ANOSCOPY      No family history on file.    Social History   Social History  . Marital status: Single    Spouse name: N/A  . Number of children: N/A  . Years of education: N/A   Social History Main Topics  . Smoking status: Never Smoker  . Smokeless tobacco: Never Used  . Alcohol use No     Comment: quit per patient 5.15.17  . Drug use: No  . Sexual activity: No     Comment: given condoms   Other Topics Concern  . None   Social History Narrative  . None    Allergies  Allergen Reactions  . Penicillins Itching     Current Outpatient Prescriptions:  .  abacavir-dolutegravir-lamiVUDine (TRIUMEQ) 600-50-300 MG tablet, Take 1 tablet by mouth  daily., Disp: 30 tablet, Rfl: 5 .  amitriptyline (ELAVIL) 50 MG tablet, TAKE 1 TABLET BY MOUTH AT BEDTIME, Disp: 30 tablet, Rfl: 5 .  ENSURE (ENSURE), Take 237 mLs by mouth 2 (two) times daily between meals., Disp: 237 mL, Rfl: 5    Review of Systems  Constitutional: Negative for activity change, appetite change and unexpected weight change.  HENT: Negative for sinus pressure, sneezing and trouble swallowing.   Eyes: Negative for photophobia and visual disturbance.  Respiratory: Negative for chest tightness, wheezing and stridor.   Cardiovascular: Negative for chest pain, palpitations and leg swelling.  Gastrointestinal: Negative for abdominal distention, anal bleeding, blood in stool, constipation and nausea.  Genitourinary: Negative for difficulty urinating, dysuria, flank pain and hematuria.  Musculoskeletal: Negative for arthralgias, back pain, gait problem, joint swelling and myalgias.  Skin: Positive for rash. Negative for color change, pallor and wound.  Neurological: Positive for numbness. Negative for dizziness, tremors, weakness and light-headedness.  Hematological: Negative for adenopathy. Does not bruise/bleed easily.  Psychiatric/Behavioral: Negative for agitation, behavioral problems, confusion and decreased concentration.       Objective:   Physical Exam  Constitutional: He is oriented to person, place, and time. He appears well-developed and well-nourished. No distress.  HENT:  Head: Normocephalic and atraumatic.  Mouth/Throat: Oropharynx is clear and moist. No oropharyngeal exudate.  Eyes: Conjunctivae and EOM are normal. Pupils are equal, round, and reactive to light. No scleral icterus.  Neck: Normal range of motion. Neck supple. No JVD present.  Cardiovascular: Normal rate, regular rhythm and normal heart sounds.  Exam reveals no gallop and no friction rub.   No murmur heard. Pulmonary/Chest: Effort normal. No respiratory distress. He has no rales. He exhibits no  tenderness.  Abdominal: Soft. He exhibits no distension and no mass. There is no tenderness. There is no rebound and no guarding.  Musculoskeletal: He exhibits no edema or tenderness.  Lymphadenopathy:    He has no cervical adenopathy.  Neurological: He is alert and oriented to person, place, and time. He has normal reflexes. He exhibits normal muscle tone. Coordination normal.  Skin: Skin is warm. No rash noted. He is not diaphoretic.  Psychiatric: He has a normal mood and affect. His behavior is normal. Judgment and thought content normal.              Assessment & Plan:   HIV: Doing very well on TRIUMEQ  Perfect control. We will recheck labs and have him renew ADAP in January. He can RTC to see me in one year     Atypical chest pain: not recurred   Rash: Due to eczema and responded to  topical steroids  Neuralgia:continue amitriptyline   Alcholism  In remission:   I spent greater than 25  minutes with the patient including greater than 50% of time in face to face counsel of the patient re his prior atypical chest pain, his HIV,  his rash and his etohism in remission and in coordination of their care.

## 2015-12-07 NOTE — Progress Notes (Signed)
Arthur Lynch is here for his month 49 Reprieve study visit. He denies any new problems or concerns. He has not had a recurrence of chest pain since he was evaluated for it by Dr. Tommy Medal in July. His rash has also completely cleared up he said within a few times of using triamcinolone cream. He denies any muscle pain or weakness and says his adherence has been good. He will return in January for the next study appt.

## 2015-12-08 ENCOUNTER — Encounter: Payer: Self-pay | Admitting: Infectious Disease

## 2015-12-25 ENCOUNTER — Other Ambulatory Visit: Payer: Self-pay | Admitting: Infectious Disease

## 2015-12-25 DIAGNOSIS — B20 Human immunodeficiency virus [HIV] disease: Secondary | ICD-10-CM

## 2016-01-07 ENCOUNTER — Ambulatory Visit: Payer: Self-pay | Admitting: Infectious Diseases

## 2016-04-05 ENCOUNTER — Other Ambulatory Visit: Payer: Self-pay

## 2016-04-06 ENCOUNTER — Other Ambulatory Visit: Payer: Self-pay | Admitting: *Deleted

## 2016-04-06 MED ORDER — ENSURE PO LIQD: 237.0000 mL | Freq: Two times a day (BID) | ORAL | 5 refills | Status: DC

## 2016-04-18 ENCOUNTER — Encounter (INDEPENDENT_AMBULATORY_CARE_PROVIDER_SITE_OTHER): Payer: Self-pay | Admitting: *Deleted

## 2016-04-18 VITALS — BP 114/77 | HR 101 | Temp 98.3°F | Wt 144.8 lb

## 2016-04-18 DIAGNOSIS — Z006 Encounter for examination for normal comparison and control in clinical research program: Secondary | ICD-10-CM

## 2016-04-18 NOTE — Progress Notes (Signed)
Arthur Lynch is here for his month 20 visit for Reprieve, A Randomized Trial to Prevent Vascular Events in HIV (study drug is Pitavastatin 4mg  or placebo). He denies any new muscle aches or weakness. And he says his adherence to study meds is good. He has not had any recurrence of chest pain since back in July and has avoided getting the flu this season so far. He will return in May for the next study visit.

## 2016-05-17 ENCOUNTER — Other Ambulatory Visit: Payer: Self-pay

## 2016-05-23 ENCOUNTER — Encounter (HOSPITAL_COMMUNITY): Payer: Self-pay

## 2016-05-23 ENCOUNTER — Emergency Department (HOSPITAL_COMMUNITY): Payer: Self-pay

## 2016-05-23 ENCOUNTER — Emergency Department (HOSPITAL_COMMUNITY)
Admission: EM | Admit: 2016-05-23 | Discharge: 2016-05-23 | Disposition: A | Discharge status: Home / Self Care | Payer: Self-pay | Attending: Emergency Medicine | Admitting: Emergency Medicine

## 2016-05-23 ENCOUNTER — Telehealth: Payer: Self-pay | Admitting: *Deleted

## 2016-05-23 DIAGNOSIS — R0789 Other chest pain: Secondary | ICD-10-CM | POA: Insufficient documentation

## 2016-05-23 DIAGNOSIS — K219 Gastro-esophageal reflux disease without esophagitis: Secondary | ICD-10-CM | POA: Insufficient documentation

## 2016-05-23 DIAGNOSIS — J449 Chronic obstructive pulmonary disease, unspecified: Secondary | ICD-10-CM | POA: Insufficient documentation

## 2016-05-23 DIAGNOSIS — A084 Viral intestinal infection, unspecified: Secondary | ICD-10-CM | POA: Insufficient documentation

## 2016-05-23 DIAGNOSIS — Z79899 Other long term (current) drug therapy: Secondary | ICD-10-CM | POA: Insufficient documentation

## 2016-05-23 LAB — HEPATIC FUNCTION PANEL
ALBUMIN: 3.8 g/dL (ref 3.5–5.0)
ALT: 23 U/L (ref 17–63)
AST: 56 U/L — AB (ref 15–41)
Alkaline Phosphatase: 66 U/L (ref 38–126)
BILIRUBIN DIRECT: 0.2 mg/dL (ref 0.1–0.5)
BILIRUBIN TOTAL: 1 mg/dL (ref 0.3–1.2)
Indirect Bilirubin: 0.8 mg/dL (ref 0.3–0.9)
Total Protein: 8.7 g/dL — ABNORMAL HIGH (ref 6.5–8.1)

## 2016-05-23 LAB — URINALYSIS, ROUTINE W REFLEX MICROSCOPIC
Bilirubin Urine: NEGATIVE
Glucose, UA: NEGATIVE mg/dL
HGB URINE DIPSTICK: NEGATIVE
KETONES UR: 5 mg/dL — AB
Leukocytes, UA: NEGATIVE
Nitrite: NEGATIVE
PH: 5 (ref 5.0–8.0)
Protein, ur: 100 mg/dL — AB
Specific Gravity, Urine: 1.025 (ref 1.005–1.030)

## 2016-05-23 LAB — BASIC METABOLIC PANEL
ANION GAP: 15 (ref 5–15)
BUN: 9 mg/dL (ref 6–20)
CALCIUM: 9.3 mg/dL (ref 8.9–10.3)
CHLORIDE: 98 mmol/L — AB (ref 101–111)
CO2: 21 mmol/L — AB (ref 22–32)
Creatinine, Ser: 1.47 mg/dL — ABNORMAL HIGH (ref 0.61–1.24)
GFR calc Af Amer: >60 mL/min (ref >60)
GFR calc non Af Amer: 53 mL/min — ABNORMAL LOW (ref >60)
Glucose, Bld: 131 mg/dL — ABNORMAL HIGH (ref 65–99)
POTASSIUM: 3.4 mmol/L — AB (ref 3.5–5.1)
Sodium: 134 mmol/L — ABNORMAL LOW (ref 135–145)

## 2016-05-23 LAB — I-STAT TROPONIN, ED
TROPONIN I, POC: 0 ng/mL (ref 0.00–0.08)
Troponin i, poc: 0 ng/mL (ref 0.00–0.08)

## 2016-05-23 LAB — CBC
HEMATOCRIT: 43.6 % (ref 39.0–52.0)
Hemoglobin: 15.7 g/dL (ref 13.0–17.0)
MCH: 36.9 pg — AB (ref 26.0–34.0)
MCHC: 36 g/dL (ref 30.0–36.0)
MCV: 102.6 fL — AB (ref 78.0–100.0)
Platelets: 301 10*3/uL (ref 150–400)
RBC: 4.25 MIL/uL (ref 4.22–5.81)
RDW: 13.1 % (ref 11.5–15.5)
WBC: 6.5 10*3/uL (ref 4.0–10.5)

## 2016-05-23 LAB — LIPASE, BLOOD: Lipase: 11 U/L (ref 11–51)

## 2016-05-23 LAB — D-DIMER, QUANTITATIVE: D-Dimer, Quant: 0.46 ug/mL-FEU (ref 0.00–0.50)

## 2016-05-23 MED ORDER — ONDANSETRON 4 MG PO TBDP: 4.0000 mg | ORAL_TABLET | Freq: Three times a day (TID) | ORAL | 0 refills | Status: DC | PRN

## 2016-05-23 MED ORDER — FAMOTIDINE 20 MG PO TABS
20.0000 mg | ORAL_TABLET | Freq: Once | ORAL | Status: AC
  Administered 2016-05-23: 20 mg via ORAL
  Filled 2016-05-23: qty 1

## 2016-05-23 MED ORDER — SODIUM CHLORIDE 0.9 % IV BOLUS (SEPSIS)
1000.0000 mL | Freq: Once | INTRAVENOUS | Status: AC
  Administered 2016-05-23: 1000 mL via INTRAVENOUS

## 2016-05-23 MED ORDER — FAMOTIDINE 20 MG PO TABS: 20.0000 mg | ORAL_TABLET | Freq: Two times a day (BID) | ORAL | 0 refills | Status: DC

## 2016-05-23 MED ORDER — GI COCKTAIL ~~LOC~~
30.0000 mL | Freq: Once | ORAL | Status: AC
  Administered 2016-05-23: 30 mL via ORAL
  Filled 2016-05-23: qty 30
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
## 2016-05-23 NOTE — ED Notes (Signed)
Went to retrieve pt from lobby, x-ray tech was getting pt to transport him to x-ray. Let the x-ray tech to bring pt to room when done.

## 2016-05-23 NOTE — ED Notes (Signed)
Patient at xray

## 2016-05-23 NOTE — ED Triage Notes (Signed)
Per Pt, Pt is coming from home with complaints of left-sided chest pain that started this morning. Pt reports having nausea and vomiting x 4 days. Denies SOB.

## 2016-05-23 NOTE — Discharge Instructions (Signed)
Take your medications as prescribed as needed for nausea and abdominal pain. Continue drinking fluids at home to remain hydrated. Continue taking your home medications as prescribed. I recommend eating a bland diet for the next few days until your symptoms have improved. Follow-up with your primary care provider at your scheduled appointment within the next week. Please return to the Emergency Department if symptoms worsen or new onset of fever, new/worsening chest pain, difficulty breathing, worsening abdominal pain, vomiting, unable to keep fluids down.

## 2016-05-23 NOTE — Telephone Encounter (Signed)
Patient called and left a voice mail stating he has been out of work since Friday 05/19/16 with upset stomach. The message was not clear. Attempted to call patient back and left voice mail. Noticed that he has gone to the ED. Myrtis Hopping

## 2016-05-31 ENCOUNTER — Ambulatory Visit: Payer: Self-pay | Admitting: Infectious Disease

## 2016-05-31 ENCOUNTER — Ambulatory Visit: Payer: Self-pay

## 2016-07-10 ENCOUNTER — Ambulatory Visit: Payer: Self-pay

## 2016-07-10 ENCOUNTER — Encounter: Payer: Self-pay | Admitting: Infectious Disease

## 2016-07-10 ENCOUNTER — Ambulatory Visit (INDEPENDENT_AMBULATORY_CARE_PROVIDER_SITE_OTHER): Payer: Self-pay | Admitting: Infectious Disease

## 2016-07-10 VITALS — BP 124/87 | HR 98 | Temp 98.4°F | Ht 74.0 in | Wt 134.0 lb

## 2016-07-10 DIAGNOSIS — B0229 Other postherpetic nervous system involvement: Secondary | ICD-10-CM

## 2016-07-10 DIAGNOSIS — L719 Rosacea, unspecified: Secondary | ICD-10-CM

## 2016-07-10 DIAGNOSIS — Z113 Encounter for screening for infections with a predominantly sexual mode of transmission: Secondary | ICD-10-CM

## 2016-07-10 DIAGNOSIS — G500 Trigeminal neuralgia: Secondary | ICD-10-CM

## 2016-07-10 DIAGNOSIS — R85619 Unspecified abnormal cytological findings in specimens from anus: Secondary | ICD-10-CM

## 2016-07-10 DIAGNOSIS — Z79899 Other long term (current) drug therapy: Secondary | ICD-10-CM

## 2016-07-10 DIAGNOSIS — Z23 Encounter for immunization: Secondary | ICD-10-CM

## 2016-07-10 DIAGNOSIS — B20 Human immunodeficiency virus [HIV] disease: Secondary | ICD-10-CM

## 2016-07-10 LAB — CMP 10231
AG RATIO: 1 ratio (ref 1.0–2.5)
ALK PHOS: 96 U/L (ref 40–115)
ALT: 20 U/L (ref 9–46)
AST: 54 U/L — ABNORMAL HIGH (ref 10–35)
Albumin: 3.7 g/dL (ref 3.6–5.1)
BILIRUBIN TOTAL: 0.7 mg/dL (ref 0.2–1.2)
BUN/Creatinine Ratio: 8.6 Ratio (ref 6–22)
BUN: 10 mg/dL (ref 7–25)
CO2: 30 mmol/L (ref 20–31)
Calcium: 9.3 mg/dL (ref 8.6–10.3)
Chloride: 104 mmol/L (ref 98–110)
Creat: 1.16 mg/dL (ref 0.70–1.33)
GFR, EST AFRICAN AMERICAN: 83 mL/min (ref >=60)
GFR, Est Non African American: 71 mL/min (ref >=60)
GLOBULIN: 3.6 g/dL (ref 1.9–3.7)
Glucose, Bld: 94 mg/dL (ref 65–99)
Potassium: 3.7 mmol/L (ref 3.5–5.3)
Sodium: 142 mmol/L (ref 135–146)
Total Protein: 7.3 g/dL (ref 6.1–8.1)

## 2016-07-10 LAB — COMPLETE METABOLIC PANEL WITH GFR

## 2016-07-10 LAB — CBC WITH DIFFERENTIAL/PLATELET
BASOS PCT: 1 %
Basophils Absolute: 41 cells/uL (ref 0–200)
EOS PCT: 0 %
Eosinophils Absolute: 0 cells/uL — ABNORMAL LOW (ref 15–500)
HEMATOCRIT: 39.8 % (ref 38.5–50.0)
Hemoglobin: 12.9 g/dL — ABNORMAL LOW (ref 13.2–17.1)
LYMPHS PCT: 33 %
Lymphs Abs: 1353 cells/uL (ref 850–3900)
MCH: 34.4 pg — ABNORMAL HIGH (ref 27.0–33.0)
MCHC: 32.4 g/dL (ref 32.0–36.0)
MCV: 106.1 fL — ABNORMAL HIGH (ref 80.0–100.0)
MONO ABS: 451 {cells}/uL (ref 200–950)
MONOS PCT: 11 %
MPV: 9.3 fL (ref 7.5–12.5)
NEUTROS PCT: 55 %
Neutro Abs: 2255 cells/uL (ref 1500–7800)
PLATELETS: 382 10*3/uL (ref 140–400)
RBC: 3.75 MIL/uL — ABNORMAL LOW (ref 4.20–5.80)
RDW: 13.6 % (ref 11.0–15.0)
WBC: 4.1 10*3/uL (ref 3.8–10.8)

## 2016-07-10 LAB — COMPREHENSIVE METABOLIC PANEL
AG Ratio: 1 Ratio (ref 1.0–2.5)
ALT: 20 U/L (ref 9–46)
AST: 54 U/L — AB (ref 10–35)
Albumin: 3.7 g/dL (ref 3.6–5.1)
Alkaline Phosphatase: 96 U/L (ref 40–115)
BUN/Creatinine Ratio: 8.6 Ratio (ref 6–22)
BUN: 10 mg/dL (ref 7–25)
CHLORIDE: 104 mmol/L (ref 98–110)
CO2: 30 mmol/L (ref 20–31)
Calcium: 9.3 mg/dL (ref 8.6–10.3)
Creat: 1.16 mg/dL (ref 0.70–1.33)
GFR, EST AFRICAN AMERICAN: 83 mL/min (ref >=60)
GFR, EST NON AFRICAN AMERICAN: 71 mL/min (ref >=60)
Globulin: 3.6 g/dL (ref 1.9–3.7)
Glucose, Bld: 94 mg/dL (ref 65–99)
Potassium: 3.7 mmol/L (ref 3.5–5.3)
Sodium: 142 mmol/L (ref 135–146)
Total Bilirubin: 0.7 mg/dL (ref 0.2–1.2)
Total Protein: 7.3 g/dL (ref 6.1–8.1)

## 2016-07-10 LAB — LIPID PANEL
CHOLESTEROL: 179 mg/dL (ref <200)
HDL: 132 mg/dL (ref >40)
LDL CALC: 36 mg/dL (ref <100)
TRIGLYCERIDES: 53 mg/dL (ref <150)
Total CHOL/HDL Ratio: 1.4 Ratio (ref <5.0)
VLDL: 11 mg/dL (ref <30)

## 2016-07-10 NOTE — Progress Notes (Signed)
Subjective:   Chief complaint : followup for HIV on meds   Patient ID: Arthur Lynch, male    DOB: [birth year], 56 y.o.   MRN: [id number]  HPI  Arthur Lynch is a 56 y.o. male who has  been doing superbly well on his  antiviral regimen, of o Triumeq. He continued with essentially undetectable viral load and healthy CD4 count on this regimen.   Lab Results  Component Value Date   HIV1RNAQUANT <20 11/23/2015   HIV1RNAQUANT <20 08/17/2015   HIV1RNAQUANT 38 (H) 06/15/2015     Lab Results  Component Value Date   CD4TABS 530 11/23/2015   CD4TABS 560 06/15/2015   CD4TABS 610 11/09/2014    He has not renewed his ADAP on time but somehow he claims that he was able to get a bottle of TRIUMEQ from [facility] 1-2 weeks ago?  Will ask him to do ADAP and [employer]  Past Medical History:  Diagnosis Date  . Abnormal Pap smear   . Alcoholism in remission ([place]) 08/16/2015  . Anemia   . Atypical chest pain 10/06/2015  . Cancer ([place])   . COPD (chronic obstructive pulmonary disease) ([place])   . Depression   . Eczema 10/06/2015  . HIV (human immunodeficiency virus infection) ([place])   . Murmur, heart   . Neuromuscular disorder ([place])   . Substance abuse     Past Surgical History:  Procedure Laterality Date  . ANOSCOPY      No family history on file.    Social History   Social History  . Marital status: Single    Spouse name: N/A  . Number of children: N/A  . Years of education: N/A   Social History Main Topics  . Smoking status: Never Smoker  . Smokeless tobacco: Never Used  . Alcohol use No     Comment: quit per patient 5.15.17  . Drug use: No  . Sexual activity: No     Comment: given condoms   Other Topics Concern  . Not on file   Social History Narrative  . No narrative on file    Allergies  Allergen Reactions  . Penicillins Itching     Current Outpatient Prescriptions:  .  abacavir-dolutegravir-lamiVUDine (TRIUMEQ) 600-50-300 MG tablet, Take 1 tablet  by mouth daily., Disp: 30 tablet, Rfl: 5 .  amitriptyline (ELAVIL) 50 MG tablet, TAKE 1 TABLET BY MOUTH AT BEDTIME, Disp: 30 tablet, Rfl: 5 .  ENSURE (ENSURE), Take 237 mLs by mouth 2 (two) times daily between meals., Disp: 237 mL, Rfl: 5 .  famotidine (PEPCID) 20 MG tablet, Take 1 tablet (20 mg total) by mouth 2 (two) times daily., Disp: 30 tablet, Rfl: 0 .  Investigational - Study Medication, Take 1 capsule by mouth daily. Study name:  Additional study details: per pt HIV Study Medication, Disp: , Rfl:  .  ondansetron (ZOFRAN ODT) 4 MG disintegrating tablet, Take 1 tablet (4 mg total) by mouth every 8 (eight) hours as needed for nausea or vomiting., Disp: 10 tablet, Rfl: 0 .  TRIUMEQ 600-50-300 MG tablet, TAKE 1 TABLET BY MOUTH DAILY (Patient not taking: Reported on 05/23/2016), Disp: 30 tablet, Rfl: 5    Review of Systems  Constitutional: Negative for activity change, appetite change and unexpected weight change.  HENT: Negative for sinus pressure, sneezing and trouble swallowing.   Eyes: Negative for photophobia and visual disturbance.  Respiratory: Negative for chest tightness, wheezing and stridor.   Cardiovascular: Negative for chest pain, palpitations and  leg swelling.  Gastrointestinal: Negative for abdominal distention, anal bleeding, blood in stool, constipation and nausea.  Genitourinary: Negative for difficulty urinating, dysuria, flank pain and hematuria.  Musculoskeletal: Negative for arthralgias, back pain, gait problem, joint swelling and myalgias.  Skin: Positive for rash. Negative for color change, pallor and wound.  Neurological: Positive for numbness. Negative for dizziness, tremors, weakness and light-headedness.  Hematological: Negative for adenopathy. Does not bruise/bleed easily.  Psychiatric/Behavioral: Negative for agitation, behavioral problems and confusion.       Objective:   Physical Exam  Constitutional: He is oriented to person, place, and time. He appears  well-developed and well-nourished. No distress.  HENT:  Head: Normocephalic and atraumatic.  Mouth/Throat: Oropharynx is clear and moist. No oropharyngeal exudate.  Eyes: Conjunctivae and EOM are normal. Pupils are equal, round, and reactive to light. No scleral icterus.  Neck: Normal range of motion. Neck supple. No JVD present.  Cardiovascular: Normal rate, regular rhythm and normal heart sounds.   Pulmonary/Chest: Effort normal. No respiratory distress.  Abdominal: Soft. He exhibits no distension. There is no tenderness. There is no rebound.  Musculoskeletal: He exhibits no edema or tenderness.  Lymphadenopathy:    He has no cervical adenopathy.  Neurological: He is alert and oriented to person, place, and time. He has normal reflexes. He exhibits normal muscle tone. Coordination normal.  Skin: Skin is warm. No rash noted. He is not diaphoretic.  Psychiatric: He has a normal mood and affect. His behavior is normal. Judgment and thought content normal.              Assessment & Plan:   HIV: Doing very well on TRIUMEQ  Perfect control. We will recheck labs and have him renew ADAP in  Today and in July, RTC in August, Sept to see me   Recent episode or GI ilnesss: resolved  Neuralgia:continue amitriptyline   Alcholism  In remission  Abnormal anal pap: needs to get into Dr. Algis [facility]s [facility] clinic  I spent greater than 25  minutes with the patient including greater than 50% of time in face to face counsel of the patient re hishis HIV,  his rash andin remission and in coordination of his care.

## 2016-07-10 NOTE — Patient Instructions (Signed)
We will get labs today  Make sure you enroll into ADAP and we may also want to do [place]  Make appt for labs in July and also to renew ADAP in July  Then rtc to see me in [place]

## 2016-07-11 LAB — RPR

## 2016-07-11 LAB — URINE CYTOLOGY ANCILLARY ONLY
CHLAMYDIA, DNA PROBE: NEGATIVE
NEISSERIA GONORRHEA: NEGATIVE

## 2016-07-11 LAB — T-HELPER CELL (CD4) - (RCID CLINIC ONLY)
CD4 % Helper T Cell: 32 % — ABNORMAL LOW (ref 33–55)
CD4 T CELL ABS: 490 /uL (ref 400–2700)

## 2016-07-12 LAB — HIV-1 RNA QUANT-NO REFLEX-BLD
HIV 1 RNA QUANT: DETECTED {copies}/mL — AB
HIV-1 RNA QUANT, LOG: DETECTED {Log_copies}/mL — AB

## 2016-07-12 NOTE — Addendum Note (Signed)
Addended by: Roma Kayser on: 07/12/2016 09:40 AM   Modules accepted: Orders

## 2016-07-14 ENCOUNTER — Encounter: Payer: Self-pay | Admitting: Infectious Disease

## 2016-07-24 ENCOUNTER — Emergency Department (HOSPITAL_COMMUNITY)
Admission: EM | Admit: 2016-07-24 | Discharge: 2016-07-25 | Disposition: A | Discharge status: Other Acute Inpatient Hospital | Payer: Self-pay | Attending: Emergency Medicine | Admitting: Emergency Medicine

## 2016-07-24 ENCOUNTER — Emergency Department (HOSPITAL_COMMUNITY): Payer: Self-pay

## 2016-07-24 ENCOUNTER — Encounter (HOSPITAL_COMMUNITY): Payer: Self-pay

## 2016-07-24 ENCOUNTER — Ambulatory Visit (HOSPITAL_COMMUNITY)
Admission: RE | Admit: 2016-07-24 | Discharge: 2016-07-24 | Disposition: A | Discharge status: Home / Self Care | Payer: Self-pay | Attending: Psychiatry | Admitting: Psychiatry

## 2016-07-24 DIAGNOSIS — J449 Chronic obstructive pulmonary disease, unspecified: Secondary | ICD-10-CM | POA: Insufficient documentation

## 2016-07-24 DIAGNOSIS — B20 Human immunodeficiency virus [HIV] disease: Secondary | ICD-10-CM | POA: Insufficient documentation

## 2016-07-24 DIAGNOSIS — F329 Major depressive disorder, single episode, unspecified: Secondary | ICD-10-CM | POA: Insufficient documentation

## 2016-07-24 DIAGNOSIS — R220 Localized swelling, mass and lump, head: Secondary | ICD-10-CM | POA: Insufficient documentation

## 2016-07-24 DIAGNOSIS — F1014 Alcohol abuse with alcohol-induced mood disorder: Secondary | ICD-10-CM | POA: Insufficient documentation

## 2016-07-24 DIAGNOSIS — F332 Major depressive disorder, recurrent severe without psychotic features: Secondary | ICD-10-CM | POA: Diagnosis present

## 2016-07-24 DIAGNOSIS — D649 Anemia, unspecified: Secondary | ICD-10-CM | POA: Insufficient documentation

## 2016-07-24 DIAGNOSIS — F10229 Alcohol dependence with intoxication, unspecified: Secondary | ICD-10-CM | POA: Diagnosis present

## 2016-07-24 DIAGNOSIS — R011 Cardiac murmur, unspecified: Secondary | ICD-10-CM | POA: Insufficient documentation

## 2016-07-24 DIAGNOSIS — Y929 Unspecified place or not applicable: Secondary | ICD-10-CM | POA: Insufficient documentation

## 2016-07-24 DIAGNOSIS — S01511A Laceration without foreign body of lip, initial encounter: Secondary | ICD-10-CM | POA: Insufficient documentation

## 2016-07-24 DIAGNOSIS — L309 Dermatitis, unspecified: Secondary | ICD-10-CM | POA: Insufficient documentation

## 2016-07-24 DIAGNOSIS — F1010 Alcohol abuse, uncomplicated: Secondary | ICD-10-CM | POA: Insufficient documentation

## 2016-07-24 DIAGNOSIS — W01198A Fall on same level from slipping, tripping and stumbling with subsequent striking against other object, initial encounter: Secondary | ICD-10-CM | POA: Insufficient documentation

## 2016-07-24 DIAGNOSIS — Y999 Unspecified external cause status: Secondary | ICD-10-CM | POA: Insufficient documentation

## 2016-07-24 DIAGNOSIS — Z880 Allergy status to penicillin: Secondary | ICD-10-CM | POA: Insufficient documentation

## 2016-07-24 DIAGNOSIS — Y939 Activity, unspecified: Secondary | ICD-10-CM | POA: Insufficient documentation

## 2016-07-24 LAB — SALICYLATE LEVEL

## 2016-07-24 LAB — CBC WITH DIFFERENTIAL/PLATELET
Basophils Absolute: 0 10*3/uL (ref 0.0–0.1)
Basophils Relative: 0 %
EOS ABS: 0 10*3/uL (ref 0.0–0.7)
EOS PCT: 0 %
HCT: 37.1 % — ABNORMAL LOW (ref 39.0–52.0)
Hemoglobin: 12.9 g/dL — ABNORMAL LOW (ref 13.0–17.0)
Lymphocytes Relative: 11 %
Lymphs Abs: 1 10*3/uL (ref 0.7–4.0)
MCH: 35.1 pg — AB (ref 26.0–34.0)
MCHC: 34.8 g/dL (ref 30.0–36.0)
MCV: 100.8 fL — ABNORMAL HIGH (ref 78.0–100.0)
MONOS PCT: 7 %
Monocytes Absolute: 0.6 10*3/uL (ref 0.1–1.0)
NEUTROS PCT: 82 %
Neutro Abs: 7.5 10*3/uL (ref 1.7–7.7)
PLATELETS: 166 10*3/uL (ref 150–400)
RBC: 3.68 MIL/uL — ABNORMAL LOW (ref 4.22–5.81)
RDW: 13 % (ref 11.5–15.5)
WBC: 9.1 10*3/uL (ref 4.0–10.5)

## 2016-07-24 LAB — COMPREHENSIVE METABOLIC PANEL
ALK PHOS: 92 U/L (ref 38–126)
ALT: 24 U/L (ref 17–63)
AST: 48 U/L — AB (ref 15–41)
Albumin: 4.2 g/dL (ref 3.5–5.0)
Anion gap: 11 (ref 5–15)
BUN: 11 mg/dL (ref 6–20)
CALCIUM: 9.6 mg/dL (ref 8.9–10.3)
CHLORIDE: 98 mmol/L — AB (ref 101–111)
CO2: 29 mmol/L (ref 22–32)
CREATININE: 0.89 mg/dL (ref 0.61–1.24)
GFR calc non Af Amer: >60 mL/min (ref >60)
Glucose, Bld: 132 mg/dL — ABNORMAL HIGH (ref 65–99)
Potassium: 3.9 mmol/L (ref 3.5–5.1)
Sodium: 138 mmol/L (ref 135–145)
Total Bilirubin: 2.3 mg/dL — ABNORMAL HIGH (ref 0.3–1.2)
Total Protein: 8.3 g/dL — ABNORMAL HIGH (ref 6.5–8.1)

## 2016-07-24 LAB — ETHANOL

## 2016-07-24 LAB — ACETAMINOPHEN LEVEL

## 2016-07-24 MED ORDER — CLINDAMYCIN HCL 300 MG PO CAPS
300.0000 mg | ORAL_CAPSULE | Freq: Three times a day (TID) | ORAL | Status: DC
  Administered 2016-07-25 (×2): 300 mg via ORAL
  Filled 2016-07-24 (×2): qty 1

## 2016-07-24 MED ORDER — LIDOCAINE-EPINEPHRINE (PF) 2 %-1:200000 IJ SOLN
10.0000 mL | Freq: Once | INTRAMUSCULAR | Status: AC
  Administered 2016-07-24: 10 mL
  Filled 2016-07-24: qty 20
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
## 2016-07-24 NOTE — ED Notes (Signed)
States a walk in from [place] [facility] for detox facility
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
## 2016-07-24 NOTE — BH Assessment (Signed)
Tele Assessment Note   Arthur Lynch is an 56 y.o. male who presents voluntarily to [facility] as a walk in and says he took a bus from his cousin's house. On the way to the bus, pt says he fell and busted his lip, so he was bleeding at the time. Pt requests help for alcohol detox, saying he has lost control of his drinking and drank two fifths the other day. He states that despite his employer being very flexible and trying to work with him, he lost his job working with [employer] due to customers saying he had alcohol on his breath and was "walking sideways". Pt admits to depression, but denies SI, HI, AVH. He states that he lives with a cousin, but she is "mad at me" right now and wants me to get help. He states that the last time he was admitted to [facility] in 2010, he stayed sober 3 years.  Pt reports there is a family history with mom and dad of drinking. Pt has fair insight and judgment. Pt's memory is normal. Pt denies legal history. ? Pt denies OP history. ? MSE: Pt is casually dressed, malodorous, alert, oriented x4 with normal speech and normal motor behavior. Eye contact is good. Pt's mood is depressed and affect is depressed and anxious. Affect is congruent with mood. Thought process is coherent and relevant. There is no indication Pt is currently responding to internal stimuli or experiencing delusional thought content. Pt was cooperative throughout assessment.    Elmarie Shiley, NP recommends inpatient detox.    Diagnosis: Alcohol abuse Disorder  Past Medical History:  Past Medical History:  Diagnosis Date  . Abnormal Pap smear   . Alcoholism in remission ([place]) 08/16/2015  . Anemia   . Atypical chest pain 10/06/2015  . Cancer ([place])   . COPD (chronic obstructive pulmonary disease) ([place])   . Depression   . Eczema 10/06/2015  . HIV (human immunodeficiency virus infection) ([place])   . Murmur, heart   . Neuromuscular disorder ([place])   . Substance abuse     Past Surgical History:  Procedure  Laterality Date  . ANOSCOPY      Family History: No family history on file.  Social History:  reports that he has never smoked. He has never used smokeless tobacco. He reports that he does not drink alcohol or use drugs.  Additional Social History:  Alcohol / Drug Use Pain Medications: denies Prescriptions: denies Over the Counter: denies History of alcohol / drug use?: Yes Longest period of sobriety (when/how long): 3 years Negative Consequences of Use: Financial, Personal relationships, Work / Youth worker Withdrawal Symptoms:  (none currently) Substance #1 Name of Substance 1: alcohol 1 - Age of First Use: 20 1 - Amount (size/oz): 1 fifth 1 - Frequency: daily 1 - Duration: ongoing 1 - Last Use / Amount: this am -1 pt  CIWA:   COWS:    PATIENT STRENGTHS: (choose at least two) Ability for insight Average or above average intelligence Capable of independent living Communication skills General fund of knowledge Motivation for treatment/growth Supportive family/friends Work skills  Allergies:  Allergies  Allergen Reactions  . Penicillins Itching    Home Medications:  (Not in a hospital admission)  OB/GYN Status:  No LMP for male patient.  General Assessment Data Location of Assessment: [facility] TTS Assessment: In system Is this a Tele or Face-to-Face Assessment?: Face-to-Face Is this an Initial Assessment or a Re-assessment for this encounter?: Initial Assessment Marital status: Single Is  patient pregnant?: No Pregnancy Status: No Living Arrangements:  (cousin) Can pt return to current living arrangement?: Yes Admission Status: Voluntary Is patient capable of signing voluntary admission?: Yes Referral Source: Self/Family/Friend Insurance type: self pay  Medical Screening Exam ([place]) Medical Exam completed: Yes  Crisis Care Plan Living Arrangements:  (cousin) Name of Psychiatrist: [facility] Name of Therapist:  none  Education Status Is patient currently in school?: No  Risk to self with the past 6 months Suicidal Ideation: No Has patient been a risk to self within the past 6 months prior to admission? : No Suicidal Intent: No Has patient had any suicidal intent within the past 6 months prior to admission? : No Is patient at risk for suicide?: No Suicidal Plan?: No Has patient had any suicidal plan within the past 6 months prior to admission? : No Access to Means: No What has been your use of drugs/alcohol within the last 12 months?: see SA section Previous Attempts/Gestures: No Intentional Self Injurious Behavior: None Family Suicide History: No Recent stressful life event(s): Job Loss Persecutory voices/beliefs?: No Depression: Yes Depression Symptoms: Insomnia, Guilt, Fatigue, Isolating, Loss of interest in usual pleasures Substance abuse history and/or treatment for substance abuse?: Yes Suicide prevention information given to non-admitted patients: Not applicable  Risk to Others within the past 6 months Homicidal Ideation: No Does patient have any lifetime risk of violence toward others beyond the six months prior to admission? : No Thoughts of Harm to Others: No Current Homicidal Intent: No Current Homicidal Plan: No Access to Homicidal Means: No History of harm to others?: No Assessment of Violence: None Noted Does patient have access to weapons?: No Criminal Charges Pending?: No Does patient have a court date: No Is patient on probation?: No  Psychosis Hallucinations: None noted Delusions: None noted  Mental Status Report Appearance/Hygiene: Unremarkable, Body odor Eye Contact: Good Motor Activity: Unremarkable Speech: Logical/coherent Level of Consciousness: Alert Mood: Depressed, Anxious Affect: Anxious, Depressed Anxiety Level: Moderate Thought Processes: Coherent, Relevant Judgement: Partial Orientation: Person, Place, Time, Situation, Appropriate for  developmental age Obsessive Compulsive Thoughts/Behaviors: None  Cognitive Functioning Memory: Recent Intact, Remote Intact IQ: Average Insight: Fair Impulse Control: Fair Appetite: Poor Weight Loss:  (40 lbs in 3 mo) Weight Gain: 0 Sleep: Decreased Total Hours of Sleep:  ("hardly any") Vegetative Symptoms: Decreased grooming  ADLScreening [facility] Assessment Services) Patient's cognitive ability adequate to safely complete daily activities?: Yes Patient able to express need for assistance with ADLs?: Yes Independently performs ADLs?: Yes (appropriate for developmental age)  Prior Inpatient Therapy Prior Inpatient Therapy: Yes Prior Therapy Dates: 2010 Prior Therapy Facilty/Provider(s): [facility] Reason for Treatment: etoh  Prior Outpatient Therapy Prior Outpatient Therapy: No Does patient have an ACCT team?: No Does patient have Intensive In-House Services?  : No Does patient have [facility] services? : No Does patient have P4CC services?: No  ADL Screening (condition at time of admission) Patient's cognitive ability adequate to safely complete daily activities?: Yes Is the patient deaf or have difficulty hearing?: No Does the patient have difficulty seeing, even when wearing glasses/contacts?: No Does the patient have difficulty concentrating, remembering, or making decisions?: Yes Patient able to express need for assistance with ADLs?: Yes Does the patient have difficulty dressing or bathing?: No Independently performs ADLs?: Yes (appropriate for developmental age) Does the patient have difficulty walking or climbing stairs?: No Weakness of Legs: None Weakness of Arms/Hands: None  Home Assistive Devices/Equipment Home Assistive Devices/Equipment: None    Abuse/Neglect Assessment (  Assessment to be complete while patient is alone) Physical Abuse: Denies Verbal Abuse: Denies Sexual Abuse: Denies Exploitation of patient/patient's resources: Denies Self-Neglect: Denies Values  / Beliefs Cultural Requests During Hospitalization: None Spiritual Requests During Hospitalization: None   Advance Directives (For Healthcare) Does Patient Have a Medical Advance Directive?: No    Additional Information 1:1 In Past 12 Months?: No CIRT Risk: No Elopement Risk: No Does patient have medical clearance?: No     Disposition:  Disposition Initial Assessment Completed for this Encounter: Yes Disposition of Patient: Inpatient treatment program Type of inpatient treatment program: Adult  [facility] 07/24/2016 2:27 PM
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
## 2016-07-25 ENCOUNTER — Inpatient Hospital Stay (HOSPITAL_COMMUNITY)
Admission: AD | Admit: 2016-07-25 | Discharge: 2016-07-28 | DRG: 897 | Disposition: A | Discharge status: Home / Self Care | Payer: Self-pay | Source: Intra-hospital | Attending: Internal Medicine | Admitting: Internal Medicine

## 2016-07-25 ENCOUNTER — Encounter (HOSPITAL_COMMUNITY): Payer: Self-pay | Admitting: *Deleted

## 2016-07-25 DIAGNOSIS — Z880 Allergy status to penicillin: Secondary | ICD-10-CM

## 2016-07-25 DIAGNOSIS — T148XXA Other injury of unspecified body region, initial encounter: Secondary | ICD-10-CM

## 2016-07-25 DIAGNOSIS — G4700 Insomnia, unspecified: Secondary | ICD-10-CM | POA: Diagnosis present

## 2016-07-25 DIAGNOSIS — T8130XA Disruption of wound, unspecified, initial encounter: Secondary | ICD-10-CM | POA: Diagnosis present

## 2016-07-25 DIAGNOSIS — F329 Major depressive disorder, single episode, unspecified: Secondary | ICD-10-CM | POA: Diagnosis present

## 2016-07-25 DIAGNOSIS — F332 Major depressive disorder, recurrent severe without psychotic features: Secondary | ICD-10-CM | POA: Diagnosis present

## 2016-07-25 DIAGNOSIS — Z811 Family history of alcohol abuse and dependence: Secondary | ICD-10-CM

## 2016-07-25 DIAGNOSIS — R45851 Suicidal ideations: Secondary | ICD-10-CM | POA: Diagnosis present

## 2016-07-25 DIAGNOSIS — B20 Human immunodeficiency virus [HIV] disease: Secondary | ICD-10-CM | POA: Diagnosis present

## 2016-07-25 DIAGNOSIS — L089 Local infection of the skin and subcutaneous tissue, unspecified: Secondary | ICD-10-CM

## 2016-07-25 DIAGNOSIS — F10229 Alcohol dependence with intoxication, unspecified: Principal | ICD-10-CM | POA: Diagnosis present

## 2016-07-25 DIAGNOSIS — S01511A Laceration without foreign body of lip, initial encounter: Secondary | ICD-10-CM | POA: Diagnosis present

## 2016-07-25 DIAGNOSIS — J449 Chronic obstructive pulmonary disease, unspecified: Secondary | ICD-10-CM | POA: Diagnosis present

## 2016-07-25 DIAGNOSIS — K130 Diseases of lips: Secondary | ICD-10-CM | POA: Diagnosis present

## 2016-07-25 DIAGNOSIS — F10239 Alcohol dependence with withdrawal, unspecified: Secondary | ICD-10-CM | POA: Diagnosis present

## 2016-07-25 DIAGNOSIS — W19XXXA Unspecified fall, initial encounter: Secondary | ICD-10-CM | POA: Diagnosis present

## 2016-07-25 DIAGNOSIS — Z79899 Other long term (current) drug therapy: Secondary | ICD-10-CM

## 2016-07-25 DIAGNOSIS — Z21 Asymptomatic human immunodeficiency virus [HIV] infection status: Secondary | ICD-10-CM

## 2016-07-25 DIAGNOSIS — Y92009 Unspecified place in unspecified non-institutional (private) residence as the place of occurrence of the external cause: Secondary | ICD-10-CM

## 2016-07-25 MED ORDER — AMITRIPTYLINE HCL 25 MG PO TABS
50.0000 mg | ORAL_TABLET | Freq: Every day | ORAL | Status: DC
  Administered 2016-07-25: 50 mg via ORAL
  Filled 2016-07-25: qty 2

## 2016-07-25 MED ORDER — TRAZODONE HCL 50 MG PO TABS
50.0000 mg | ORAL_TABLET | Freq: Every evening | ORAL | Status: DC | PRN
  Filled 2016-07-25: qty 1

## 2016-07-25 MED ORDER — VITAMIN B-1 100 MG PO TABS
100.0000 mg | ORAL_TABLET | Freq: Every day | ORAL | Status: DC
  Administered 2016-07-25: 100 mg via ORAL
  Filled 2016-07-25: qty 1

## 2016-07-25 MED ORDER — ALUM & MAG HYDROXIDE-SIMETH 200-200-20 MG/5ML PO SUSP: 30.0000 mL | ORAL | Status: DC | PRN

## 2016-07-25 MED ORDER — ACETAMINOPHEN 325 MG PO TABS
650.0000 mg | ORAL_TABLET | Freq: Four times a day (QID) | ORAL | Status: DC | PRN
  Administered 2016-07-28 (×2): 650 mg via ORAL
  Filled 2016-07-25: qty 2

## 2016-07-25 MED ORDER — ABACAVIR-DOLUTEGRAVIR-LAMIVUD 600-50-300 MG PO TABS
1.0000 | ORAL_TABLET | Freq: Every day | ORAL | Status: DC
  Administered 2016-07-26 – 2016-07-28 (×3): 1 via ORAL
  Filled 2016-07-25 (×4): qty 1

## 2016-07-25 MED ORDER — MAGNESIUM HYDROXIDE 400 MG/5ML PO SUSP: 30.0000 mL | Freq: Every day | ORAL | Status: DC | PRN

## 2016-07-25 MED ORDER — ENSURE ENLIVE PO LIQD: 237.0000 mL | Freq: Two times a day (BID) | ORAL | Status: DC

## 2016-07-25 MED ORDER — THIAMINE HCL 100 MG/ML IJ SOLN
100.0000 mg | Freq: Every day | INTRAMUSCULAR | Status: DC
Start: 2016-07-25 — End: 2016-07-25

## 2016-07-25 MED ORDER — LORAZEPAM 1 MG PO TABS
0.0000 mg | ORAL_TABLET | Freq: Four times a day (QID) | ORAL | Status: DC
  Administered 2016-07-25: 1 mg via ORAL
  Filled 2016-07-25: qty 1

## 2016-07-25 MED ORDER — AMITRIPTYLINE HCL 25 MG PO TABS
50.0000 mg | ORAL_TABLET | Freq: Every day | ORAL | Status: DC
  Administered 2016-07-26 – 2016-07-27 (×2): 50 mg via ORAL
  Filled 2016-07-25: qty 2
  Filled 2016-07-25 (×4): qty 1

## 2016-07-25 MED ORDER — LORAZEPAM 1 MG PO TABS: 0.0000 mg | ORAL_TABLET | Freq: Two times a day (BID) | ORAL | Status: DC

## 2016-07-25 MED ORDER — IBUPROFEN 800 MG PO TABS
800.0000 mg | ORAL_TABLET | Freq: Four times a day (QID) | ORAL | Status: DC | PRN
  Administered 2016-07-25: 800 mg via ORAL
  Filled 2016-07-25: qty 1

## 2016-07-25 MED ORDER — ABACAVIR-DOLUTEGRAVIR-LAMIVUD 600-50-300 MG PO TABS
1.0000 | ORAL_TABLET | Freq: Every day | ORAL | Status: DC
  Administered 2016-07-25: 1 via ORAL
  Filled 2016-07-25: qty 1

## 2016-07-25 MED ORDER — LORAZEPAM 0.5 MG PO TABS
0.5000 mg | ORAL_TABLET | Freq: Four times a day (QID) | ORAL | Status: DC | PRN
  Administered 2016-07-25: 0.5 mg via ORAL
  Filled 2016-07-25: qty 1
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
## 2016-07-25 NOTE — ED Notes (Signed)
Psych team at bedside for eval

## 2016-07-25 NOTE — ED Notes (Signed)
[facility] called at this time.

## 2016-07-25 NOTE — BH Assessment (Signed)
[place] Assessment Progress Note  Per Corena Pilgrim, MD, this pt requires psychiatric hospitalization at this time.  Letitia Libra, RN, [facility] has assigned pt to [facility] Rm [id number].  Pt has signed Voluntary Admission and Consent for Treatment, as well as Consent to Release Information to her husband, and signed forms have been faxed to [facility].  Pt's nurse, Caryl Pina, has been notified, and agrees to send original paperwork along with pt via [facility], and to call report to [phone].  Jalene Mullet, [place] Triage Specialist [phone]

## 2016-07-25 NOTE — BHH Counselor (Signed)
Clinician contacted Dr. Tyrone Nine and expressed the pt was seen at [facility] and is here for medical clarence.   Edd Fabian, MS, [facility], [facility] Triage Specialist [phone]

## 2016-07-25 NOTE — Tx Team (Signed)
Initial Treatment Plan 07/25/2016 8:51 PM Arthur Lynch BUY:[id number]    PATIENT STRESSORS: Loss of significant relationship Marital or family conflict Substance abuse   PATIENT STRENGTHS: Ability for insight Average or above average intelligence Motivation for treatment/growth Religious Affiliation   PATIENT IDENTIFIED PROBLEMS: Substance Abuse  Depression  "Why am I doing this drinking"  "Stop drinking"               DISCHARGE CRITERIA:  Adequate post-discharge living arrangements Improved stabilization in mood, thinking, and/or behavior Need for constant or close observation no longer present Verbal commitment to aftercare and medication compliance Withdrawal symptoms are absent or subacute and managed without 24-hour nursing intervention  PRELIMINARY DISCHARGE PLAN: Attend 12-step recovery group Outpatient therapy Return to previous living arrangement  PATIENT/FAMILY INVOLVEMENT: This treatment plan has been presented to and reviewed with the patient, Arthur Lynch.  The patient and family have been given the opportunity to ask questions and make suggestions.  Dustin Flock, RN 07/25/2016, 8:51 PM

## 2016-07-25 NOTE — ED Notes (Signed)
Pt given breakfast tray, attempted to eat and stated his mouth hurt too bad. Pt given apple sauce at his request and was able to eat that.

## 2016-07-26 DIAGNOSIS — F332 Major depressive disorder, recurrent severe without psychotic features: Secondary | ICD-10-CM

## 2016-07-26 DIAGNOSIS — F10229 Alcohol dependence with intoxication, unspecified: Principal | ICD-10-CM

## 2016-07-26 MED ORDER — HYDROXYZINE HCL 25 MG PO TABS
25.0000 mg | ORAL_TABLET | Freq: Four times a day (QID) | ORAL | Status: DC | PRN
  Administered 2016-07-26: 25 mg via ORAL
  Filled 2016-07-26: qty 1

## 2016-07-26 MED ORDER — VITAMIN B-1 100 MG PO TABS
100.0000 mg | ORAL_TABLET | Freq: Every day | ORAL | Status: DC
  Filled 2016-07-26: qty 1

## 2016-07-26 MED ORDER — VITAMIN B-1 100 MG PO TABS
100.0000 mg | ORAL_TABLET | Freq: Every day | ORAL | Status: DC
  Administered 2016-07-26 – 2016-07-28 (×3): 100 mg via ORAL
  Filled 2016-07-26 (×4): qty 1

## 2016-07-26 MED ORDER — ENSURE ENLIVE PO LIQD
237.0000 mL | Freq: Three times a day (TID) | ORAL | Status: DC
  Administered 2016-07-26 – 2016-07-28 (×8): 237 mL via ORAL
  Filled 2016-07-26 (×3): qty 237

## 2016-07-26 MED ORDER — LOPERAMIDE HCL 2 MG PO CAPS
2.0000 mg | ORAL_CAPSULE | ORAL | Status: DC | PRN
Start: 2016-07-26 — End: 2016-07-28

## 2016-07-26 MED ORDER — ADULT MULTIVITAMIN W/MINERALS CH
1.0000 | ORAL_TABLET | Freq: Every day | ORAL | Status: DC
  Administered 2016-07-26 – 2016-07-28 (×3): 1 via ORAL
  Filled 2016-07-26 (×4): qty 1

## 2016-07-26 MED ORDER — THIAMINE HCL 100 MG/ML IJ SOLN: 100.0000 mg | Freq: Once | INTRAMUSCULAR | Status: DC

## 2016-07-26 MED ORDER — LORAZEPAM 1 MG PO TABS: 1.0000 mg | ORAL_TABLET | Freq: Four times a day (QID) | ORAL | Status: DC | PRN

## 2016-07-26 MED ORDER — ONDANSETRON 4 MG PO TBDP: 4.0000 mg | ORAL_TABLET | Freq: Four times a day (QID) | ORAL | Status: DC | PRN

## 2016-07-26 NOTE — Progress Notes (Signed)
Recreation Therapy Notes  Date: 07/26/16 Time: 0930 Location: [address] Dayroom  Group Topic: Stress Management  Goal Area(s) Addresses:  Patient will verbalize importance of using healthy stress management.  Patient will identify positive emotions associated with healthy stress management.   Behavioral Response: Engaged  Intervention: Stress Management  Activity :  Body Scan Meditation.  LRT introduced the stress management technique of meditation.  LRT played a meditation from the Calm app to allow patients the opportunity to scan and acknowledge the sensations and tension within the body.  Patients were to follow along as the meditation played to engage in the activity.  Education:  Stress Management, Discharge Planning.   Education Outcome: Acknowledges edcuation/In group clarification offered/Needs additional education  Clinical Observations/Feedback: Pt attended group.   Victorino Sparrow, LRT/CTRS        Victorino Sparrow A 07/26/2016 12:43 PM
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
## 2016-07-26 NOTE — BHH Group Notes (Signed)
[place] LCSW Group Therapy 07/26/2016 1:15 PM  Type of Therapy: Group Therapy- Emotion Regulation  Participation Level: Reserved  Participation Quality:  Appropriate  Affect: Appropriate  Cognitive: Alert and Oriented   Insight:  Developing/Improving  Engagement in Therapy: Developing/Improving and Engaged   Modes of Intervention: Clarification, Confrontation, Discussion, Education, Exploration, Limit-setting, Orientation, Problem-solving, Rapport Building, Art therapist, Socialization and Support  Summary of Progress/Problems: The topic for group today was emotional regulation. This group focused on both positive and negative emotion identification and allowed group members to process ways to identify feelings, regulate negative emotions, and find healthy ways to manage internal/external emotions. Group members were asked to reflect on a time when their reaction to an emotion led to a negative outcome and explored how alternative responses using emotion regulation would have benefited them. Group members were also asked to discuss a time when emotion regulation was utilized when a negative emotion was experienced. Pt was reserved during group discussion but was engaged and attentive. Pt identified anger and anxiety as difficult emotions to regulate. He reports that self-medicating with alcohol is an unhealthy coping skill he uses.   Adriana Reams, LCSW 07/26/2016 3:46 PM

## 2016-07-26 NOTE — Progress Notes (Signed)
Adult Psychoeducational Group Note  Date:  07/26/2016 Time:  1:53 PM  Group Topic/Focus:  Goals Group:   The focus of this group is to help patients establish daily goals to achieve during treatment and discuss how the patient can incorporate goal setting into their daily lives to aide in recovery.  Participation Level:  Active  Participation Quality:  Appropriate and Attentive  Affect:  Appropriate  Cognitive:  Alert and Appropriate  Insight: Appropriate, Good and Improving  Engagement in Group:  Engaged  Modes of Intervention:  Discussion  Additional Comments:  Pt did participate in group this morning.  Pt states that he has a good job that he wants to get back to but is an alcoholic and needed some help as well as some time to himself to reflect on his recovery.  Arthur Lynch R Vassie Kugel 07/26/2016, 1:53 PM
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
## 2016-07-26 NOTE — Progress Notes (Signed)
D: Pt presents with flat affect and depressed mood. Pt reports seeking treatment for substance abuse. Pt reports withdrawal symptoms of chills, sweats, tremors and nausea. Herbert Pun NP., made aware. Pt reported drinking a 1/2 pt a day. Pt appears to be disheveled, with poor hygiene and body odor. Pt c/o sutures coming a loose from a split inside of his mouth. No drainage noted. Writer informed Herbert Pun, NP., will continue to monitor.  A: Medications reviewed with pt. Medications administered as ordered per MD. Verbal support provided. Pt encouraged to perform ADL's. Pt encouraged to attend groups. 15 minute checks performed for safety.  R: Pt compliant with tx.

## 2016-07-26 NOTE — Progress Notes (Signed)
NUTRITION ASSESSMENT  Pt identified as at risk on the Malnutrition Screen Tool  INTERVENTION: 1. Educated patient on the importance of nutrition and encouraged intake of food and beverages. 2. Discussed weight goals. 3. Supplements: continue Ensure Enlive but will increase to TID, each supplement provides 350 kcal and 20 grams of protein   NUTRITION DIAGNOSIS: Unintentional weight loss related to sub-optimal intake as evidenced by pt report.   Goal: Pt to meet >/= 90% of their estimated nutrition needs.  Monitor:  PO intake  Assessment:  Pt admitted for alcohol detox. He had been sober x3 years but began drinking again ~1 month ago. Per ED RN note yesterday AM, pt's mouth hurt too badly to eat solid foods for breakfast so he asked for applesauce which he was able to eat without issue. Per review, pt has lost 10 lbs (7% body weight) in the past 2 months which is significant for time frame.  Will increase Ensure Enlive order to TID. Continue to encourage PO intakes of meals, supplements, and snacks.    56 y.o. male  Height: Ht Readings from Last 1 Encounters:  07/25/16 6\' 1"  (1.854 m)    Weight: Wt Readings from Last 1 Encounters:  07/25/16 130 lb (59 kg)    Weight Hx: Wt Readings from Last 10 Encounters:  07/25/16 130 lb (59 kg)  07/10/16 134 lb (60.8 kg)  05/23/16 140 lb (63.5 kg)  04/18/16 144 lb 12 oz (65.7 kg)  12/07/15 139 lb (63 kg)  12/07/15 138 lb (62.6 kg)  10/06/15 142 lb (64.4 kg)  08/17/15 143 lb 12 oz (65.2 kg)  08/16/15 163 lb (73.9 kg)  07/06/15 144 lb (65.3 kg)    BMI:  Body mass index is 17.15 kg/m. Pt meets criteria for underweight based on current BMI.  Estimated Nutritional Needs: Kcal: 25-30 kcal/kg Protein: > 1 gram protein/kg Fluid: 1 ml/kcal  Diet Order: Diet regular Room service appropriate? Yes; Fluid consistency: Thin Pt is also offered choice of unit snacks mid-morning and mid-afternoon.  Pt is eating as desired.   Lab results  and medications reviewed.     Jarome Matin, MS, RD, LDN, [facility] Inpatient Clinical Dietitian Pager # [phone] After hours/weekend pager # [phone]
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
## 2016-07-26 NOTE — Progress Notes (Signed)
Pt has been in bed since the beginning of the shift.  He is having moderate withdrawal symptoms at this time.  He reports some mild nausea, tremors, and sweats.  Writer medicated pt for his symptoms and gave him gatorade to prevent dehydration.  Pt denies SI/HI/AVH at this time, although he says when he is intoxicated he sees things that are not real.  Writer reviewed pt's available medications with the protocol.  Pt was encouraged to make his needs known to staff.  Support and encouragement offered.  Discharge plans are in process.  Safety maintained with q15 minute checks.

## 2016-07-26 NOTE — BHH Suicide Risk Assessment (Signed)
[facility] INPATIENT:  Family/Significant Other Suicide Prevention Education  Suicide Prevention Education:  Patient Refusal for Family/Significant Other Suicide Prevention Education: The patient Arthur Lynch has refused to provide written consent for family/significant other to be provided Family/Significant Other Suicide Prevention Education during admission and/or prior to discharge.  Physician notified.  Gladstone Lighter 07/26/2016, 1:25 PM

## 2016-07-26 NOTE — Tx Team (Signed)
Interdisciplinary Treatment and Diagnostic Plan Update  07/27/2016 Time of Session: 9:30am Arthur Lynch MRN: [id number]  Principal Diagnosis: MDD (major depressive disorder)  Secondary Diagnoses: Principal Problem:   MDD (major depressive disorder)   Current Medications:  Current Facility-Administered Medications  Medication Dose Route Frequency Provider Last Rate Last Dose  . abacavir-dolutegravir-lamiVUDine (TRIUMEQ) [id number] MG per tablet 1 tablet  1 tablet Oral Daily Kerrie Buffalo, NP   1 tablet at 07/27/16 0809  . acetaminophen (TYLENOL) tablet 650 mg  650 mg Oral Q6H PRN Kerrie Buffalo, NP      . alum & mag hydroxide-simeth (MAALOX/MYLANTA) 200-200-20 MG/5ML suspension 30 mL  30 mL Oral Q4H PRN Kerrie Buffalo, NP      . amitriptyline (ELAVIL) tablet 50 mg  50 mg Oral QHS Kerrie Buffalo, NP   50 mg at 07/26/16 2115  . feeding supplement (ENSURE ENLIVE) (ENSURE ENLIVE) liquid 237 mL  237 mL Oral TID BM Cobos, Fernando A, MD   237 mL at 07/26/16 2117  . hydrOXYzine (ATARAX/VISTARIL) tablet 25 mg  25 mg Oral Q6H PRN Cobos, Myer Peer, MD   25 mg at 07/26/16 2115  . ibuprofen (ADVIL,MOTRIN) tablet 800 mg  800 mg Oral Q6H PRN Kerrie Buffalo, NP   800 mg at 07/25/16 1951  . loperamide (IMODIUM) capsule 2-4 mg  2-4 mg Oral PRN Cobos, Myer Peer, MD      . LORazepam (ATIVAN) tablet 1 mg  1 mg Oral Q6H PRN Cobos, Myer Peer, MD      . magnesium hydroxide (MILK OF MAGNESIA) suspension 30 mL  30 mL Oral Daily PRN Kerrie Buffalo, NP      . multivitamin with minerals tablet 1 tablet  1 tablet Oral Daily Cobos, Myer Peer, MD   1 tablet at 07/27/16 0809  . ondansetron (ZOFRAN-ODT) disintegrating tablet 4 mg  4 mg Oral Q6H PRN Cobos, Fernando A, MD      . thiamine (VITAMIN B-1) tablet 100 mg  100 mg Oral Daily Cobos, Myer Peer, MD   100 mg at 07/27/16 0809    PTA Medications: Prescriptions Prior to Admission  Medication Sig Dispense Refill Last Dose  .  abacavir-dolutegravir-lamiVUDine (TRIUMEQ) 600-50-300 MG tablet Take 1 tablet by mouth daily. 30 tablet 5 07/24/2016 at Unknown time  . amitriptyline (ELAVIL) 50 MG tablet TAKE 1 TABLET BY MOUTH AT BEDTIME 30 tablet 5 07/23/2016 at 1400  . Investigational - Study Medication Take 1 capsule by mouth daily. Study name:  Additional study details: per pt HIV Study Medication   07/24/2016 at Unknown time    Treatment Modalities: Medication Management, Group therapy, Case management,  1 to 1 session with clinician, Psychoeducation, Recreational therapy.  Patient Stressors: Loss of significant relationship Marital or family conflict Substance abuse  Patient Strengths: Ability for insight Average or above average intelligence Motivation for treatment/growth Religious Affiliation  Physician Treatment Plan for Primary Diagnosis: MDD (major depressive disorder) Long Term Goal(s): Improvement in symptoms so as ready for discharge  Short Term Goals: Ability to identify changes in lifestyle to reduce recurrence of condition will improve Ability to verbalize feelings will improve Ability to demonstrate self-control will improve Ability to identify and develop effective coping behaviors will improve Compliance with prescribed medications will improve Ability to identify triggers associated with substance abuse/mental health issues will improve  Medication Management: Evaluate patient's response, side effects, and tolerance of medication regimen.  Therapeutic Interventions: 1 to 1 sessions, Unit Group sessions and Medication administration.  Evaluation of Outcomes: Not  Met  Physician Treatment Plan for Secondary Diagnosis: Principal Problem:   MDD (major depressive disorder)   Long Term Goal(s): Improvement in symptoms so as ready for discharge  Short Term Goals: Ability to identify changes in lifestyle to reduce recurrence of condition will improve Ability to verbalize feelings will  improve Ability to demonstrate self-control will improve Ability to identify and develop effective coping behaviors will improve Compliance with prescribed medications will improve Ability to identify triggers associated with substance abuse/mental health issues will improve  Medication Management: Evaluate patient's response, side effects, and tolerance of medication regimen.  Therapeutic Interventions: 1 to 1 sessions, Unit Group sessions and Medication administration.  Evaluation of Outcomes: Not Met   RN Treatment Plan for Primary Diagnosis: MDD (major depressive disorder) Long Term Goal(s): Knowledge of disease and therapeutic regimen to maintain health will improve  Short Term Goals: Ability to verbalize feelings will improve, Ability to disclose and discuss suicidal ideas and Ability to identify and develop effective coping behaviors will improve  Medication Management: RN will administer medications as ordered by provider, will assess and evaluate patient's response and provide education to patient for prescribed medication. RN will report any adverse and/or side effects to prescribing provider.  Therapeutic Interventions: 1 on 1 counseling sessions, Psychoeducation, Medication administration, Evaluate responses to treatment, Monitor vital signs and CBGs as ordered, Perform/monitor CIWA, COWS, AIMS and Fall Risk screenings as ordered, Perform wound care treatments as ordered.  Evaluation of Outcomes: Not Met   LCSW Treatment Plan for Primary Diagnosis: MDD (major depressive disorder) Long Term Goal(s): Safe transition to appropriate next level of care at discharge, Engage patient in therapeutic group addressing interpersonal concerns.  Short Term Goals: Engage patient in aftercare planning with referrals and resources, Identify triggers associated with mental health/substance abuse issues and Increase skills for wellness and recovery  Therapeutic Interventions: Assess for all  discharge needs, 1 to 1 time with Social worker, Explore available resources and support systems, Assess for adequacy in community support network, Educate family and significant other(s) on suicide prevention, Complete Psychosocial Assessment, Interpersonal group therapy.  Evaluation of Outcomes: Not Met   Progress in Treatment: Attending groups: Pt is new to milieu, continuing to assess  Participating in groups: Pt is new to milieu, continuing to assess  Taking medication as prescribed: Yes, MD continues to assess for medication changes as needed Toleration medication: Yes, no side effects reported at this time Family/Significant other contact made: No, Pt declines Patient understands diagnosis: Continuing to assess Discussing patient identified problems/goals with staff: Yes Medical problems stabilized or resolved: Yes Denies suicidal/homicidal ideation: Yes Issues/concerns per patient self-inventory: None Other: N/A  New problem(s) identified: None identified at this time.   New Short Term/Long Term Goal(s): None identified at this time.   Discharge Plan or Barriers: Pt plans to return to cousin's home temporarily until finding his own apartment/room. He will follow-up with [facility]  Reason for Continuation of Hospitalization: Depression Medication stabilization Withdrawal symptoms  Estimated Length of Stay: 2-4 days  Attendees: Patient: 07/27/2016  8:40 AM  Physician: Dr. Parke Poisson 07/27/2016  8:40 AM  Nursing: Birdie Hopes, RN 07/27/2016  8:40 AM  RN Care Manager: Lars Pinks, RN 07/27/2016  8:40 AM  Social Worker: Adriana Reams, LCSW; Matthew Saras 07/27/2016  8:40 AM  Recreational Therapist:  07/27/2016  8:40 AM  Other: Lindell Spar, NP 07/27/2016  8:40 AM  Other:  07/27/2016  8:40 AM  Other: 07/27/2016  8:40 AM    Scribe for Treatment Team: Ander Purpura  Dennis Bast, LCSW 07/27/2016 8:40 AM

## 2016-07-27 DIAGNOSIS — F419 Anxiety disorder, unspecified: Secondary | ICD-10-CM

## 2016-07-27 DIAGNOSIS — G4700 Insomnia, unspecified: Secondary | ICD-10-CM

## 2016-07-27 DIAGNOSIS — F39 Unspecified mood [affective] disorder: Secondary | ICD-10-CM

## 2016-07-27 NOTE — BHH Group Notes (Signed)
[place] LCSW Group Therapy 07/27/2016 1:15pm  Type of Therapy: Group Therapy- Balance in Life  Participation Level: Active   Description of the Group:  The topic for group was balance in life. Today's group focused on defining balance in one's own words, identifying things that can knock one off balance, and exploring healthy ways to maintain balance in life. Group members were asked to provide an example of a time when they felt off balance, describe how they handled that situation,and process healthier ways to regain balance in the future. Group members were asked to share the most important tool for maintaining balance that they learned while at [facility] and how they plan to apply this method after discharge.  Summary of Patient Progress Pt participated in group discussion and identified structure in his day as something that helps him stay balanced. Pt expressed that he writes things down to allow himself to see goals.     Therapeutic Modalities:   Cognitive Behavioral Therapy Solution-Focused Therapy Assertiveness Training   Adriana Reams, LCSW 07/27/2016 6:37 PM

## 2016-07-27 NOTE — Progress Notes (Signed)
D: Pt presents with a flat affect. Pt appears brighter on approach today. Pt rates depression 2/10. Pt denies withdrawal symptoms. Pt denies SI. Pt appeared cautious during shift assessment and forwarded little information. Pt observed attending groups today with minimal participation. Pt c/o left eye swelling after starting a new medication (Elavil) last night. Dr. Parke Poisson and treatment team made aware.  A: Medications reviewed with pt. Medications administered as ordered per MD. Verbal support provided. Pt encouraged to attend groups. Laceration to mouth assessed, no drainage noted. 15 minute checks performed for safety. R: Pt compliant with tx.

## 2016-07-27 NOTE — Progress Notes (Signed)
Adult Psychoeducational Group Note  Date:  07/27/2016 Time:  1:51 AM  Group Topic/Focus:  Wrap-Up Group:   The focus of this group is to help patients review their daily goal of treatment and discuss progress on daily workbooks.  Participation Level:  Active  Participation Quality:  Attentive  Affect:  Appropriate  Cognitive:  Appropriate  Insight: Appropriate and Good  Engagement in Group:  Developing/Improving  Modes of Intervention:  Discussion  Additional Comments:  Pt attended group, and stated he had a great day. Pt stated he was able to attend group and enjoyed them. Pt stated he has a great support system of people who will be willing to help him with his mental health when he discharges.  Abe People Brittini 07/27/2016, 1:51 AM

## 2016-07-27 NOTE — Progress Notes (Signed)
Patient ID: Arthur Lynch, male   DOB: Jan 05, 1961, 56 y.o.   MRN: [id number] D: Client visible on the unit, in dayroom. Client reports "of his day "great" "doing good on my medicine" "I woke up this morning and I prayed" Client plans to continue therapy through church counseling. "I'll be working with the elder and the deacon, who will direct me to [facility]" "I going to continue my part-time job and look for a full time job" A: Probation officer provided emotional support encouraged client to focus on recovery. Medications reviewed, administered as ordered. Staff will monitor q33min for safety. R: Client is safe on the unit, attended karaoke, "I sang a [employer], I'm old school" "I dance a little with them"
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
## 2016-07-27 NOTE — Plan of Care (Addendum)
Problem: Activity: Goal: Interest or engagement in activities will improve Outcome: Progressing  Pt compliant with attending scheduled groups today.

## 2016-07-27 NOTE — Progress Notes (Signed)
Adult Psychoeducational Group Note  Date:  07/27/2016 Time:  0845 am  Group Topic/Focus:  Orientation:   The focus of this group is to educate the patient on the purpose and policies of crisis stabilization and provide a format to answer questions about their admission.  The group details unit policies and expectations of patients while admitted.  Participation Level:  Did Not Attend  Participation Quality:    Affect:    Cognitive:    Insight:   Engagement in Group:    Modes of Intervention:    Additional Comments:    Makella Buckingham L 07/27/2016, 4:51 PM

## 2016-07-27 NOTE — Progress Notes (Signed)
Pt has been up and out of his room this evening.  He reports feeling much better and states his withdrawal symptoms are mild.  He has spent the evening sitting in the dayroom talking with peers. He is pleasant and appropriate.  He denies having any significant pain to his mouth injury that occurred prior to his admit.  He says that he has been able to eat enough today, and has pudding in his room if he needs it.  He denies SI/HI/AVH.  He makes his needs known to staff.  Support and encouragement offered.  Discharge plans are in process.  Pt is contemplating long term treatment after his detox.  Safety maintained with q15 minute checks.

## 2016-07-28 ENCOUNTER — Inpatient Hospital Stay (HOSPITAL_COMMUNITY)
Admission: AD | Admit: 2016-07-28 | Discharge: 2016-07-30 | DRG: 157 | Disposition: A | Discharge status: Home / Self Care | Payer: Self-pay | Source: Ambulatory Visit | Attending: Internal Medicine | Admitting: Internal Medicine

## 2016-07-28 ENCOUNTER — Encounter (HOSPITAL_COMMUNITY): Payer: Self-pay

## 2016-07-28 ENCOUNTER — Encounter (HOSPITAL_COMMUNITY): Payer: Self-pay | Admitting: *Deleted

## 2016-07-28 DIAGNOSIS — K130 Diseases of lips: Secondary | ICD-10-CM | POA: Diagnosis present

## 2016-07-28 DIAGNOSIS — F10239 Alcohol dependence with withdrawal, unspecified: Secondary | ICD-10-CM | POA: Diagnosis present

## 2016-07-28 DIAGNOSIS — L089 Local infection of the skin and subcutaneous tissue, unspecified: Secondary | ICD-10-CM | POA: Diagnosis present

## 2016-07-28 DIAGNOSIS — F329 Major depressive disorder, single episode, unspecified: Secondary | ICD-10-CM | POA: Diagnosis present

## 2016-07-28 DIAGNOSIS — F1024 Alcohol dependence with alcohol-induced mood disorder: Secondary | ICD-10-CM | POA: Diagnosis present

## 2016-07-28 DIAGNOSIS — W1830XA Fall on same level, unspecified, initial encounter: Secondary | ICD-10-CM

## 2016-07-28 DIAGNOSIS — Z880 Allergy status to penicillin: Secondary | ICD-10-CM

## 2016-07-28 DIAGNOSIS — Z859 Personal history of malignant neoplasm, unspecified: Secondary | ICD-10-CM

## 2016-07-28 DIAGNOSIS — R45851 Suicidal ideations: Secondary | ICD-10-CM | POA: Diagnosis present

## 2016-07-28 DIAGNOSIS — R6510 Systemic inflammatory response syndrome (SIRS) of non-infectious origin without acute organ dysfunction: Secondary | ICD-10-CM

## 2016-07-28 DIAGNOSIS — B20 Human immunodeficiency virus [HIV] disease: Secondary | ICD-10-CM | POA: Diagnosis present

## 2016-07-28 DIAGNOSIS — S01511A Laceration without foreign body of lip, initial encounter: Secondary | ICD-10-CM | POA: Diagnosis present

## 2016-07-28 LAB — URINALYSIS, ROUTINE W REFLEX MICROSCOPIC
BILIRUBIN URINE: NEGATIVE
GLUCOSE, UA: NEGATIVE mg/dL
HGB URINE DIPSTICK: NEGATIVE
Ketones, ur: NEGATIVE mg/dL
Leukocytes, UA: NEGATIVE
Nitrite: NEGATIVE
Protein, ur: NEGATIVE mg/dL
Specific Gravity, Urine: 1.006 (ref 1.005–1.030)
pH: 6 (ref 5.0–8.0)

## 2016-07-28 LAB — COMPREHENSIVE METABOLIC PANEL
ALT: 18 U/L (ref 17–63)
AST: 37 U/L (ref 15–41)
Albumin: 4 g/dL (ref 3.5–5.0)
Alkaline Phosphatase: 95 U/L (ref 38–126)
Anion gap: 7 (ref 5–15)
BUN: 15 mg/dL (ref 6–20)
CHLORIDE: 96 mmol/L — AB (ref 101–111)
CO2: 27 mmol/L (ref 22–32)
CREATININE: 0.93 mg/dL (ref 0.61–1.24)
Calcium: 9.2 mg/dL (ref 8.9–10.3)
GFR calc Af Amer: >60 mL/min (ref >60)
Glucose, Bld: 92 mg/dL (ref 65–99)
Potassium: 4.4 mmol/L (ref 3.5–5.1)
Sodium: 130 mmol/L — ABNORMAL LOW (ref 135–145)
Total Bilirubin: 1 mg/dL (ref 0.3–1.2)
Total Protein: 8.3 g/dL — ABNORMAL HIGH (ref 6.5–8.1)

## 2016-07-28 LAB — CBC WITH DIFFERENTIAL/PLATELET
BASOS ABS: 0 10*3/uL (ref 0.0–0.1)
Basophils Relative: 0 %
Eosinophils Absolute: 0 10*3/uL (ref 0.0–0.7)
Eosinophils Relative: 0 %
HCT: 35.5 % — ABNORMAL LOW (ref 39.0–52.0)
HEMOGLOBIN: 12.3 g/dL — AB (ref 13.0–17.0)
LYMPHS ABS: 1.1 10*3/uL (ref 0.7–4.0)
LYMPHS PCT: 11 %
MCH: 35.1 pg — AB (ref 26.0–34.0)
MCHC: 34.6 g/dL (ref 30.0–36.0)
MCV: 101.4 fL — AB (ref 78.0–100.0)
Monocytes Absolute: 0.9 10*3/uL (ref 0.1–1.0)
Monocytes Relative: 10 %
NEUTROS ABS: 7.2 10*3/uL (ref 1.7–7.7)
Neutrophils Relative %: 79 %
Platelets: 157 10*3/uL (ref 150–400)
RBC: 3.5 MIL/uL — AB (ref 4.22–5.81)
RDW: 12.8 % (ref 11.5–15.5)
WBC: 9.2 10*3/uL (ref 4.0–10.5)

## 2016-07-28 LAB — I-STAT CG4 LACTIC ACID, ED: Lactic Acid, Venous: 0.81 mmol/L (ref 0.5–1.9)

## 2016-07-28 MED ORDER — BIOTENE DRY MOUTH MT LIQD
15.0000 mL | Freq: Three times a day (TID) | OROMUCOSAL | Status: DC | PRN
  Administered 2016-07-28: 15 mL via OROMUCOSAL
  Filled 2016-07-28: qty 15

## 2016-07-28 MED ORDER — ENSURE ENLIVE PO LIQD
237.0000 mL | Freq: Two times a day (BID) | ORAL | Status: DC
  Administered 2016-07-29 – 2016-07-30 (×3): 237 mL via ORAL

## 2016-07-28 MED ORDER — DEXTROSE 5 % IV SOLN
1.0000 g | INTRAVENOUS | Status: DC
  Administered 2016-07-28: 1 g via INTRAVENOUS
  Filled 2016-07-28: qty 10

## 2016-07-28 MED ORDER — SODIUM CHLORIDE 0.9% FLUSH: 3.0000 mL | INTRAVENOUS | Status: DC | PRN

## 2016-07-28 MED ORDER — ADULT MULTIVITAMIN W/MINERALS CH
1.0000 | ORAL_TABLET | Freq: Every day | ORAL | Status: DC
  Administered 2016-07-28 – 2016-07-29 (×2): 1 via ORAL
  Filled 2016-07-28 (×2): qty 1

## 2016-07-28 MED ORDER — ACETAMINOPHEN 325 MG PO TABS: 650.0000 mg | ORAL_TABLET | Freq: Four times a day (QID) | ORAL | Status: DC | PRN

## 2016-07-28 MED ORDER — METRONIDAZOLE IN NACL 5-0.79 MG/ML-% IV SOLN: 500.0000 mg | Freq: Once | INTRAVENOUS | Status: DC

## 2016-07-28 MED ORDER — SODIUM CHLORIDE 0.9 % IV SOLN: 250.0000 mL | INTRAVENOUS | Status: DC | PRN

## 2016-07-28 MED ORDER — SODIUM CHLORIDE 0.9 % IV BOLUS (SEPSIS): 1000.0000 mL | Freq: Once | INTRAVENOUS | Status: DC

## 2016-07-28 MED ORDER — SODIUM CHLORIDE 0.9% FLUSH
3.0000 mL | Freq: Two times a day (BID) | INTRAVENOUS | Status: DC
  Administered 2016-07-28 – 2016-07-30 (×4): 3 mL via INTRAVENOUS

## 2016-07-28 MED ORDER — DEXTROSE 5 % IV SOLN: 2.0000 g | Freq: Once | INTRAVENOUS | Status: DC

## 2016-07-28 MED ORDER — LIDOCAINE HCL 1 % IJ SOLN
INTRAMUSCULAR | Status: AC
  Filled 2016-07-28: qty 20

## 2016-07-28 MED ORDER — SODIUM CHLORIDE 0.9 % IV SOLN
250.0000 mL | INTRAVENOUS | Status: DC | PRN
  Administered 2016-07-28 – 2016-07-30 (×2): 250 mL via INTRAVENOUS

## 2016-07-28 MED ORDER — METRONIDAZOLE IN NACL 5-0.79 MG/ML-% IV SOLN
500.0000 mg | Freq: Three times a day (TID) | INTRAVENOUS | Status: DC
  Administered 2016-07-28 – 2016-07-30 (×5): 500 mg via INTRAVENOUS
  Filled 2016-07-28 (×5): qty 100

## 2016-07-28 MED ORDER — HYDROXYZINE HCL 25 MG PO TABS: 25.0000 mg | ORAL_TABLET | Freq: Three times a day (TID) | ORAL | Status: DC | PRN

## 2016-07-28 MED ORDER — AMITRIPTYLINE HCL 25 MG PO TABS
50.0000 mg | ORAL_TABLET | Freq: Every day | ORAL | Status: DC
  Administered 2016-07-28 – 2016-07-29 (×2): 50 mg via ORAL
  Filled 2016-07-28 (×2): qty 2

## 2016-07-28 MED ORDER — BACITRACIN ZINC 500 UNIT/GM EX OINT: TOPICAL_OINTMENT | Freq: Two times a day (BID) | CUTANEOUS | Status: DC

## 2016-07-28 MED ORDER — ACETAMINOPHEN 650 MG RE SUPP: 650.0000 mg | Freq: Four times a day (QID) | RECTAL | Status: DC | PRN

## 2016-07-28 MED ORDER — DEXTROSE 5 % IV SOLN
1.0000 g | INTRAVENOUS | Status: DC
  Administered 2016-07-29 – 2016-07-30 (×2): 1 g via INTRAVENOUS
  Filled 2016-07-28 (×2): qty 10

## 2016-07-28 MED ORDER — ABACAVIR-DOLUTEGRAVIR-LAMIVUD 600-50-300 MG PO TABS
1.0000 | ORAL_TABLET | Freq: Every day | ORAL | Status: DC
  Administered 2016-07-29 – 2016-07-30 (×2): 1 via ORAL
  Filled 2016-07-28 (×2): qty 1

## 2016-07-28 MED ORDER — FOLIC ACID 1 MG PO TABS
1.0000 mg | ORAL_TABLET | Freq: Every day | ORAL | Status: DC
  Administered 2016-07-28 – 2016-07-30 (×3): 1 mg via ORAL
  Filled 2016-07-28 (×3): qty 1

## 2016-07-28 MED ORDER — METRONIDAZOLE IN NACL 5-0.79 MG/ML-% IV SOLN
500.0000 mg | Freq: Three times a day (TID) | INTRAVENOUS | Status: DC
  Administered 2016-07-28: 500 mg via INTRAVENOUS
  Filled 2016-07-28: qty 100

## 2016-07-28 MED ORDER — SODIUM CHLORIDE 0.9 % IV BOLUS (SEPSIS)
1000.0000 mL | Freq: Once | INTRAVENOUS | Status: AC
Start: 2016-07-28 — End: 2016-07-28
  Administered 2016-07-28: 1000 mL via INTRAVENOUS

## 2016-07-28 MED ORDER — LORAZEPAM 1 MG PO TABS: 1.0000 mg | ORAL_TABLET | Freq: Four times a day (QID) | ORAL | Status: DC | PRN

## 2016-07-28 MED ORDER — LORAZEPAM 2 MG/ML IJ SOLN: 1.0000 mg | Freq: Four times a day (QID) | INTRAMUSCULAR | Status: DC | PRN

## 2016-07-28 MED ORDER — BENZOCAINE 10 % MT GEL
Freq: Four times a day (QID) | OROMUCOSAL | Status: DC | PRN
  Filled 2016-07-28: qty 9.4

## 2016-07-28 MED ORDER — ENOXAPARIN SODIUM 40 MG/0.4ML ~~LOC~~ SOLN: 40.0000 mg | SUBCUTANEOUS | Status: DC

## 2016-07-28 MED ORDER — BACITRACIN ZINC 500 UNIT/GM EX OINT
TOPICAL_OINTMENT | Freq: Two times a day (BID) | CUTANEOUS | Status: DC
  Administered 2016-07-29: 09:00:00 via TOPICAL
  Administered 2016-07-29: 1 via TOPICAL
  Administered 2016-07-30: 10:00:00 via TOPICAL
  Filled 2016-07-28 (×2): qty 28.35

## 2016-07-28 MED ORDER — SODIUM CHLORIDE 0.9 % IV BOLUS (SEPSIS)
1000.0000 mL | Freq: Once | INTRAVENOUS | Status: AC
  Administered 2016-07-28: 1000 mL via INTRAVENOUS

## 2016-07-28 MED ORDER — ONDANSETRON HCL 4 MG PO TABS: 4.0000 mg | ORAL_TABLET | Freq: Four times a day (QID) | ORAL | Status: DC | PRN

## 2016-07-28 MED ORDER — ENOXAPARIN SODIUM 40 MG/0.4ML ~~LOC~~ SOLN
40.0000 mg | SUBCUTANEOUS | Status: DC
  Administered 2016-07-28 – 2016-07-29 (×2): 40 mg via SUBCUTANEOUS
  Filled 2016-07-28 (×2): qty 0.4

## 2016-07-28 MED ORDER — ONDANSETRON HCL 4 MG/2ML IJ SOLN: 4.0000 mg | Freq: Four times a day (QID) | INTRAMUSCULAR | Status: DC | PRN

## 2016-07-28 MED ORDER — VANCOMYCIN HCL IN DEXTROSE 1-5 GM/200ML-% IV SOLN: 1000.0000 mg | Freq: Once | INTRAVENOUS | Status: DC

## 2016-07-28 MED ORDER — ALBUTEROL SULFATE (2.5 MG/3ML) 0.083% IN NEBU: 2.5000 mg | INHALATION_SOLUTION | RESPIRATORY_TRACT | Status: DC | PRN

## 2016-07-28 MED ORDER — SODIUM CHLORIDE 0.9% FLUSH: 3.0000 mL | Freq: Two times a day (BID) | INTRAVENOUS | Status: DC

## 2016-07-28 MED ORDER — VITAMIN B-1 100 MG PO TABS
100.0000 mg | ORAL_TABLET | Freq: Every day | ORAL | Status: DC
  Administered 2016-07-28 – 2016-07-30 (×3): 100 mg via ORAL
  Filled 2016-07-28 (×3): qty 1

## 2016-07-28 MED ORDER — THIAMINE HCL 100 MG/ML IJ SOLN: 100.0000 mg | Freq: Every day | INTRAMUSCULAR | Status: DC

## 2016-07-28 NOTE — H&P (Deleted)
  The note originally documented on this encounter has been moved the the encounter in which it belongs.  

## 2016-07-28 NOTE — Progress Notes (Signed)
Patient this AM c/o pain in left inner lip.  Open area malodorous with purulent drainage.  Edema observed on right face  Stitches not intact.  Afebrile.  VS normal except tachycardic and slightly orthostatic.  NP/MD notified, MD consulted IM.  Fluids pushed, patient reports difficulty eating to pain in mouth.  Received order to send patient to [facility] to be evaluated.  Sent via [place], report called to ED charge.
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
## 2016-07-28 NOTE — Progress Notes (Signed)
PHARMACY NOTE -  Flagyl  Pharmacy has been assisting with dosing of Flagyl for infected wound; Rocephin per MD Dosage remains stable at 500 mg IV q8 hr and need for further dosage adjustment appears unlikely at present.    Will sign off at this time.  Please reconsult if a change in clinical status warrants re-evaluation of dosage.  Reuel Boom, PharmD, BCPS Pager: [phone] 07/28/2016, 2:13 PM

## 2016-07-28 NOTE — Progress Notes (Signed)
Pt attended [place] group and participated.

## 2016-07-28 NOTE — Plan of Care (Signed)
Problem: Safety: Goal: Periods of time without injury will increase Outcome: Progressing Periods of time without injury will increase AEB q68min safety checks, client denies SI, remains safe on the unit.
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
## 2016-07-28 NOTE — ED Notes (Signed)
Bed: WLPT1 Expected date:  Expected time:  Means of arrival:  Comments: 

## 2016-07-29 DIAGNOSIS — S01511D Laceration without foreign body of lip, subsequent encounter: Secondary | ICD-10-CM

## 2016-07-29 DIAGNOSIS — L089 Local infection of the skin and subcutaneous tissue, unspecified: Secondary | ICD-10-CM

## 2016-07-29 LAB — CBC
HEMATOCRIT: 30.9 % — AB (ref 39.0–52.0)
Hemoglobin: 10.6 g/dL — ABNORMAL LOW (ref 13.0–17.0)
MCH: 35 pg — AB (ref 26.0–34.0)
MCHC: 34.3 g/dL (ref 30.0–36.0)
MCV: 102 fL — ABNORMAL HIGH (ref 78.0–100.0)
Platelets: 163 10*3/uL (ref 150–400)
RBC: 3.03 MIL/uL — ABNORMAL LOW (ref 4.22–5.81)
RDW: 12.9 % (ref 11.5–15.5)
WBC: 4.6 10*3/uL (ref 4.0–10.5)

## 2016-07-29 LAB — BASIC METABOLIC PANEL
Anion gap: 7 (ref 5–15)
BUN: 10 mg/dL (ref 6–20)
CALCIUM: 9 mg/dL (ref 8.9–10.3)
CO2: 27 mmol/L (ref 22–32)
Chloride: 102 mmol/L (ref 101–111)
Creatinine, Ser: 0.97 mg/dL (ref 0.61–1.24)
GFR calc Af Amer: >60 mL/min (ref >60)
GLUCOSE: 95 mg/dL (ref 65–99)
Potassium: 3.6 mmol/L (ref 3.5–5.1)
Sodium: 136 mmol/L (ref 135–145)

## 2016-07-29 NOTE — Progress Notes (Signed)
PROGRESS NOTE    Arthur Lynch  IEP:[id number] DOB: [birth year] DOA: 07/28/2016 PCP: Truman Hayward, MD     Brief Narrative:  56 y/o man admitted from [facility] on 5/18 due to an infected lip laceration. Was admitted to [facility] a few days back due to ETOH withdrawals and depression with suicidal ideation.   Assessment & Plan:   Active Problems:   Infected lip laceration   Infected lip Laceration/Mild Facial Cellulitis -Seems improved. -Continue rocephin/flagyl today. -Will need IV abx for another 24 hours  ETOH Withdrawals -Continue ativan per CIWA protocol. -Seems to have gone thru the worse of the withdrawals already.  Major Depression -Consult psychiatry as he may need to return to [facility] once medically stable. -Continue sitter at bedside.  HIV -Continue antiretrovirals.   DVT prophylaxis: lovenox Code Status: full code Family Communication: patient only Disposition Plan: should be medically ready for DC in the 24 hours. Psych has been consulted to see if he needs to return to [facility].  Consultants:   Psychiatry  Procedures:   None  Antimicrobials:  Anti-infectives    Start     Dose/Rate Route Frequency Ordered Stop   07/29/16 1000  cefTRIAXone (ROCEPHIN) 1 g in dextrose 5 % 50 mL IVPB     1 g 100 mL/hr over 30 Minutes Intravenous Every 24 hours 07/28/16 2122     07/29/16 1000  abacavir-dolutegravir-lamiVUDine (TRIUMEQ) 600-50-300 MG per tablet 1 tablet     1 tablet Oral Daily 07/28/16 2122     07/28/16 2200  metroNIDAZOLE (FLAGYL) IVPB 500 mg     500 mg 100 mL/hr over 60 Minutes Intravenous Every 8 hours 07/28/16 2122         Subjective: Feels well, swelling has improved  Objective: Vitals:   07/28/16 2122 07/29/16 0611  BP: (!) 133/94 105/82  Pulse: 96 88  Resp: 19 18  Temp: 98.9 F (37.2 C) 98.6 F (37 C)  TempSrc: Oral Oral  SpO2: 100% 100%  Weight: 59 kg (130 lb)   Height: 6\' 1"  (1.854 m)     Intake/Output Summary (Last 24 hours) at  07/29/16 1004 Last data filed at 07/29/16 0841  Gross per 24 hour  Intake              510 ml  Output                0 ml  Net              510 ml   Filed Weights   07/28/16 2122  Weight: 59 kg (130 lb)    Examination:  General exam: Alert, awake, oriented x 3 Respiratory system: Clear to auscultation. Respiratory effort normal. Cardiovascular system:RRR. No murmurs, rubs, gallops. Gastrointestinal system: Abdomen is nondistended, soft and nontender. No organomegaly or masses felt. Normal bowel sounds heard. Central nervous system: Alert and oriented. No focal neurological deficits. Extremities: No C/C/E, +pedal pulses Skin: No rashes, lesions or ulcers Psychiatry: Judgement and insight appear normal. Mood & affect appropriate.     Data Reviewed: I have personally reviewed following labs and imaging studies  CBC:  Recent Labs Lab 07/24/16 2209 07/28/16 1346 07/29/16 0627  WBC 9.1 9.2 4.6  NEUTROABS 7.5 7.2  --   HGB 12.9* 12.3* 10.6*  HCT 37.1* 35.5* 30.9*  MCV 100.8* 101.4* 102.0*  PLT 166 157 660   Basic Metabolic Panel:  Recent Labs Lab 07/24/16 2209 07/28/16 1346 07/29/16 0627  NA 138 130* 136  K  3.9 4.4 3.6  CL 98* 96* 102  CO2 29 27 27   GLUCOSE 132* 92 95  BUN 11 15 10   CREATININE 0.89 0.93 0.97  CALCIUM 9.6 9.2 9.0   GFR: Estimated Creatinine Clearance: 73.5 mL/min (by C-G formula based on SCr of 0.97 mg/dL). Liver Function Tests:  Recent Labs Lab 07/24/16 2209 07/28/16 1346  AST 48* 37  ALT 24 18  ALKPHOS 92 95  BILITOT 2.3* 1.0  PROT 8.3* 8.3*  ALBUMIN 4.2 4.0   No results for input(s): LIPASE, AMYLASE in the last 168 hours. No results for input(s): AMMONIA in the last 168 hours. Coagulation Profile: No results for input(s): INR, PROTIME in the last 168 hours. Cardiac Enzymes: No results for input(s): CKTOTAL, CKMB, CKMBINDEX, TROPONINI in the last 168 hours. BNP (last 3 results) No results for input(s): PROBNP in the last 8760  hours. HbA1C: No results for input(s): HGBA1C in the last 72 hours. CBG: No results for input(s): GLUCAP in the last 168 hours. Lipid Profile: No results for input(s): CHOL, HDL, LDLCALC, TRIG, CHOLHDL, LDLDIRECT in the last 72 hours. Thyroid Function Tests: No results for input(s): TSH, T4TOTAL, FREET4, T3FREE, THYROIDAB in the last 72 hours. Anemia Panel: No results for input(s): VITAMINB12, FOLATE, FERRITIN, TIBC, IRON, RETICCTPCT in the last 72 hours. Urine analysis:    Component Value Date/Time   COLORURINE YELLOW 07/28/2016 [place] 07/28/2016 1518   LABSPEC 1.006 07/28/2016 1518   PHURINE 6.0 07/28/2016 1518   GLUCOSEU NEGATIVE 07/28/2016 1518   HGBUR NEGATIVE 07/28/2016 1518   BILIRUBINUR NEGATIVE 07/28/2016 1518   [place] 07/28/2016 1518   PROTEINUR NEGATIVE 07/28/2016 1518   UROBILINOGEN 1.0 04/05/2012 2040   NITRITE NEGATIVE 07/28/2016 1518   LEUKOCYTESUR NEGATIVE 07/28/2016 1518   Sepsis Labs: @LABRCNTIP (procalcitonin:4,lacticidven:4)  )No results found for this or any previous visit (from the past 240 hour(s)).       Radiology Studies: No results found.      Scheduled Meds: . abacavir-dolutegravir-lamiVUDine  1 tablet Oral Daily  . amitriptyline  50 mg Oral QHS  . bacitracin   Topical BID  . enoxaparin (LOVENOX) injection  40 mg Subcutaneous Q24H  . feeding supplement (ENSURE ENLIVE)  237 mL Oral [id number]  . folic acid  1 mg Oral Daily  . multivitamin with minerals  1 tablet Oral QHS  . sodium chloride flush  3 mL Intravenous Q12H  . thiamine  100 mg Oral Daily   Or  . thiamine  100 mg Intravenous Daily   Continuous Infusions: . sodium chloride 250 mL (07/28/16 2146)  . cefTRIAXone (ROCEPHIN)  IV 1 g (07/29/16 0923)  . metronidazole Stopped (07/29/16 2119)     LOS: 1 day    Time spent: 25 minutes. Greater than 50% of this time was spent in direct contact with the patient coordinating care.     Lelon Frohlich, MD [facility] Pager [phone]  If 7PM-7AM, please contact night-coverage [email or web address] Password [email or web address] 07/29/2016, 10:04 AM

## 2016-07-30 DIAGNOSIS — Z813 Family history of other psychoactive substance abuse and dependence: Secondary | ICD-10-CM

## 2016-07-30 DIAGNOSIS — F1024 Alcohol dependence with alcohol-induced mood disorder: Secondary | ICD-10-CM

## 2016-07-30 MED ORDER — CLINDAMYCIN HCL 300 MG PO CAPS: 300.0000 mg | ORAL_CAPSULE | Freq: Three times a day (TID) | ORAL | 0 refills | Status: AC

## 2016-07-30 MED ORDER — THIAMINE HCL 100 MG PO TABS: 100.0000 mg | ORAL_TABLET | Freq: Every day | ORAL | Status: DC

## 2016-07-30 MED ORDER — ADULT MULTIVITAMIN W/MINERALS CH: 1.0000 | ORAL_TABLET | Freq: Every day | ORAL | Status: DC

## 2016-07-30 MED ORDER — FOLIC ACID 1 MG PO TABS: 1.0000 mg | ORAL_TABLET | Freq: Every day | ORAL | Status: DC

## 2016-07-30 NOTE — Progress Notes (Signed)
CSW spoke with [facility] regarding patient/ admission. Per [facility], patient was a walk-in a [place] and never admitted to their facility. Patient will need to referred to [facility] plus other facilities. AC will follow up with CSW if they are able to accept patient.   Kingsley Spittle, [place] Clinical Social Worker [phone]

## 2016-07-30 NOTE — Discharge Summary (Addendum)
Physician Discharge Summary  Arthur Lynch IDP:[id number] DOB: [birth year] DOA: 07/28/2016  PCP: Truman Hayward, MD  Admit date: 07/28/2016 Discharge date: 07/30/2016  Time spent: 45 minutes  Recommendations for Outpatient Follow-up:  -Will be discharged home today, unclear if he will need to go back to [facility]: psych consultation to determine need for further inpatient psychiatric care. Addendum: Psychiatry has cleared patient for discharge home. -Will need to continue clindamycin for an additional 8 days.   Discharge Diagnoses:  Active Problems:   Infected lip laceration   Discharge Condition: Stable and improved  Filed Weights   07/28/16 2122  Weight: 59 kg (130 lb)    History of present illness:  As per Dr. Sloan Leiter on 5/18: Arthur Lynch is a 56 y.o. male with medical history significant of HIV on antiretroviral therapy, long-standing history of alcohol use, depression who was admitted to the behavioral [facility] a few days back for alcohol withdrawal, depression with suicidal ideation. If her presenting to the hospital then, he had sustained a mechanical fall when he was intoxicated with alcohol and had lacerated his lower lip. His lower lip was sutured in the emergency room, he was then subsequently transferred to behavioral [place]. While at [place], on 5/16, he was noted to have dehiscence of his left lower lip wound with some purulent discharge. This worsened over the past few days, with more swelling and more discharge of his left lower lip. He was subsequently transferred to the emergency room, where he was found to be tachycardic but he was afebrile. ED M.D. noted increased swelling and discharge, sutures were subsequently removed, and the wound was irrigated copiously with saline. He was given IV Rocephin and Flagyl, hospitalist service was then asked to admit this patient for further evaluation and treatment.  Patient's last alcoholic  drink was on 4/43, he feels much better-his mood is much better. He hardly has any tremors on exam.  There is no history of fever, headache, chest pain, shortness of breath, nausea, vomiting or diarrhea.  Hospital Course:   Infected lip Laceration/Mild Facial Cellulitis -Cellulitis resolved, no further purulent drainage of lip laceration. -Transition over to clindamycin to complete another 8 days of antibiotics.  ETOH Withdrawals -Continue ativan per CIWA protocol. -Seems to have gone thru the worse of the withdrawals already and has not exhibited any signs of withdrawal here.  Major Depression with Suicidal Ideation -Consult psychiatry as he may need to return to [facility] once medically stable. -Continue sitter at bedside.  HIV -Continue antiretrovirals.  Procedures:  None   Consultations:  Psychiatry  Discharge Instructions   Allergies as of 07/30/2016      Reactions   Penicillins Itching   Has patient had a PCN reaction causing immediate rash, facial/tongue/throat swelling, SOB or lightheadedness with hypotension: No Has patient had a PCN reaction causing severe rash involving mucus membranes or skin necrosis: No Has patient had a PCN reaction that required hospitalization No Has patient had a PCN reaction occurring within the last 10 years: No If all of the above answers are "NO", then may proceed with Cephalosporin use.      Medication List    TAKE these medications   abacavir-dolutegravir-lamiVUDine [id number] MG tablet Commonly known as:  TRIUMEQ Take 1 tablet by mouth daily.   amitriptyline 50 MG tablet Commonly known as:  ELAVIL TAKE 1 TABLET BY MOUTH AT BEDTIME   clindamycin 300 MG capsule Commonly known as:  CLEOCIN Take 1  capsule (300 mg total) by mouth 3 (three) times daily.   folic acid 1 MG tablet Commonly known as:  FOLVITE Take 1 tablet (1 mg total) by mouth daily. Start taking on:  07/31/2016   Investigational - Study Medication Take 1  capsule by mouth daily. Study name:  Additional study details: per pt HIV Study Medication   multivitamin with minerals Tabs tablet Take 1 tablet by mouth at bedtime.   thiamine 100 MG tablet Take 1 tablet (100 mg total) by mouth daily. Start taking on:  07/31/2016      Allergies  Allergen Reactions  . Penicillins Itching    Has patient had a PCN reaction causing immediate rash, facial/tongue/throat swelling, SOB or lightheadedness with hypotension: No Has patient had a PCN reaction causing severe rash involving mucus membranes or skin necrosis: No Has patient had a PCN reaction that required hospitalization No Has patient had a PCN reaction occurring within the last 10 years: No If all of the above answers are "NO", then may proceed with Cephalosporin use.       The results of significant diagnostics from this hospitalization (including imaging, microbiology, ancillary and laboratory) are listed below for reference.    Significant Diagnostic Studies: Ct Head Wo Contrast  Result Date: 07/24/2016 CLINICAL DATA:  Patient fell after drinking, striking face and lasting lip. Loss of consciousness. EXAM: CT HEAD WITHOUT CONTRAST CT MAXILLOFACIAL WITHOUT CONTRAST TECHNIQUE: Multidetector CT imaging of the head and maxillofacial structures were performed using the standard protocol without intravenous contrast. Multiplanar CT image reconstructions of the maxillofacial structures were also generated. COMPARISON:  None. FINDINGS: CT HEAD FINDINGS Brain: Chronic stable small vessel ischemic changes of periventricular subcortical white matter. Idiopathic basal ganglial calcifications are noted bilaterally. No intracranial hemorrhage, midline shift or edema. No hydrocephalus. No intra-axial mass nor extra-axial fluid collections. The basal cisterns and fourth ventricle are midline without effacement. Vascular: No hyperdense vessel or unexpected calcification. Skull: Negative for fracture or focal  lesion. Other: None. CT MAXILLOFACIAL FINDINGS Osseous: No fracture or mandibular dislocation. No destructive process. Cervical spondylosis with degenerative disc disease of the visualized cervical spine from C3 through C7. Osteoarthritis of the atlantodental interval. Orbits: Negative. No traumatic or inflammatory finding. Sinuses: Right maxillary mucous retention cyst. No acute sinus disease. The visualized mastoids are clear. Soft tissues: Soft tissue laceration and swelling of the lower lip to the left of midline. Associated soft tissue debris is seen in the area swelling. IMPRESSION: 1. No acute intracranial abnormality. Chronic small vessel ischemic disease of periventricular and subcortical white matter. 2. Soft tissue swelling and laceration with soft tissue debris involving the lower lip to the left of midline. No acute maxillofacial fracture identified. Electronically Signed   By: Ashley Royalty M.D.   On: 07/24/2016 23:15   Dg Shoulder Left  Result Date: 07/24/2016 CLINICAL DATA:  Fall with left shoulder pain after injury. EXAM: LEFT SHOULDER - 2+ VIEW COMPARISON:  None. FINDINGS: There is no evidence of fracture or dislocation. There is no evidence of arthropathy or other focal bone abnormality. Soft tissues are unremarkable. IMPRESSION: No fracture or subluxation of the left shoulder. Electronically Signed   By: Jeb Levering M.D.   On: 07/24/2016 22:59   Ct Maxillofacial Wo Cm  Result Date: 07/24/2016 CLINICAL DATA:  Patient fell after drinking, striking face and lasting lip. Loss of consciousness. EXAM: CT HEAD WITHOUT CONTRAST CT MAXILLOFACIAL WITHOUT CONTRAST TECHNIQUE: Multidetector CT imaging of the head and maxillofacial structures were performed using the  standard protocol without intravenous contrast. Multiplanar CT image reconstructions of the maxillofacial structures were also generated. COMPARISON:  None. FINDINGS: CT HEAD FINDINGS Brain: Chronic stable small vessel ischemic changes  of periventricular subcortical white matter. Idiopathic basal ganglial calcifications are noted bilaterally. No intracranial hemorrhage, midline shift or edema. No hydrocephalus. No intra-axial mass nor extra-axial fluid collections. The basal cisterns and fourth ventricle are midline without effacement. Vascular: No hyperdense vessel or unexpected calcification. Skull: Negative for fracture or focal lesion. Other: None. CT MAXILLOFACIAL FINDINGS Osseous: No fracture or mandibular dislocation. No destructive process. Cervical spondylosis with degenerative disc disease of the visualized cervical spine from C3 through C7. Osteoarthritis of the atlantodental interval. Orbits: Negative. No traumatic or inflammatory finding. Sinuses: Right maxillary mucous retention cyst. No acute sinus disease. The visualized mastoids are clear. Soft tissues: Soft tissue laceration and swelling of the lower lip to the left of midline. Associated soft tissue debris is seen in the area swelling. IMPRESSION: 1. No acute intracranial abnormality. Chronic small vessel ischemic disease of periventricular and subcortical white matter. 2. Soft tissue swelling and laceration with soft tissue debris involving the lower lip to the left of midline. No acute maxillofacial fracture identified. Electronically Signed   By: Ashley Royalty M.D.   On: 07/24/2016 23:15    Microbiology: Recent Results (from the past 240 hour(s))  Blood culture (routine x 2)     Status: None (Preliminary result)   Collection Time: 07/28/16  1:51 PM  Result Value Ref Range Status   Specimen Description BLOOD RIGHT FOREARM  Final   Special Requests   Final    BOTTLES DRAWN AEROBIC AND ANAEROBIC Blood Culture adequate volume   Culture   Final    NO GROWTH < 24 HOURS Performed at [facility], [place] [address], [place], [place] [address]    Report Status PENDING  Incomplete  Blood culture (routine x 2)     Status: None (Preliminary result)   Collection Time:  07/28/16  2:03 PM  Result Value Ref Range Status   Specimen Description BLOOD RIGHT WRIST  Final   Special Requests IN PEDIATRIC BOTTLE Blood Culture adequate volume  Final   Culture   Final    NO GROWTH < 24 HOURS Performed at [facility], [place] [address] [place], [place] [address]    Report Status PENDING  Incomplete     Labs: Basic Metabolic Panel:  Recent Labs Lab 07/24/16 2209 07/28/16 1346 07/29/16 0627  NA 138 130* 136  K 3.9 4.4 3.6  CL 98* 96* 102  CO2 29 27 27   GLUCOSE 132* 92 95  BUN 11 15 10   CREATININE 0.89 0.93 0.97  CALCIUM 9.6 9.2 9.0   Liver Function Tests:  Recent Labs Lab 07/24/16 2209 07/28/16 1346  AST 48* 37  ALT 24 18  ALKPHOS 92 95  BILITOT 2.3* 1.0  PROT 8.3* 8.3*  ALBUMIN 4.2 4.0   No results for input(s): LIPASE, AMYLASE in the last 168 hours. No results for input(s): AMMONIA in the last 168 hours. CBC:  Recent Labs Lab 07/24/16 2209 07/28/16 1346 07/29/16 0627  WBC 9.1 9.2 4.6  NEUTROABS 7.5 7.2  --   HGB 12.9* 12.3* 10.6*  HCT 37.1* 35.5* 30.9*  MCV 100.8* 101.4* 102.0*  PLT 166 157 163   Cardiac Enzymes: No results for input(s): CKTOTAL, CKMB, CKMBINDEX, TROPONINI in the last 168 hours. BNP: BNP (last 3 results) No results for input(s): BNP in the last 8760  hours.  ProBNP (last 3 results) No results for input(s): PROBNP in the last 8760 hours.  CBG: No results for input(s): GLUCAP in the last 168 hours.     SignedLelon Frohlich  [facility] Pager: [phone] 07/30/2016, 10:04 AM

## 2016-07-30 NOTE — Progress Notes (Signed)
CSW spoke with Dr. Darleene Cleaver regarding discharge plans. Patient has been psychiatrically cleared as of this AM, per Dr. Darleene Cleaver. CSW will update MD and RN. Patient has belongings at [facility] will contact security for assistance.   Kingsley Spittle, LCSWA Clinical Social Worker [phone]

## 2016-07-30 NOTE — Progress Notes (Signed)
Pt leaving to go home with family member this afternoon. Alert, oriented, and without c/o. Discharge instructions/prescriptions given/explained with pt verbalizing understanding.  Pt left with belongings from [facility]. Cell phone, clothes, shoes, money.

## 2016-07-30 NOTE — Consult Note (Signed)
[facility] Consult   Reason for Consult:  Needs return to [facility] Referring Physician: Dr. Jerilee Hoh Patient Identification: Arthur Lynch MRN:  [id number] Principal Diagnosis: Infected lip laceration Diagnosis:   Patient Active Problem List   Diagnosis Date Noted  . Alcohol dependence with alcohol-induced mood disorder ([place]) [F10.24] 06/07/2009    Priority: High  . Major depressive disorder with current active episode [F32.9] 12/10/2006    Priority: High  . Infection of lip [K13.0] 07/28/2016  . Infected lip laceration [S01.511A, L08.9] 07/28/2016  . Alcohol dependence with acute alcoholic intoxication ([place]) [F10.229] 07/25/2016  . Major depressive disorder, recurrent severe without psychotic features ([place]) [F33.2] 07/25/2016  . MDD (major depressive disorder) [F32.9] 07/25/2016  . Atypical chest pain [R07.89] 10/06/2015  . Eczema [L30.9] 10/06/2015  . Alcoholism in remission ([place]) [F10.21] 08/16/2015  . HIV disease ([place]) [B20] 06/04/2014  . HZV (herpes zoster virus) post herpetic neuralgia [B02.29] 06/04/2014  . Rash and nonspecific skin eruption [R21] 05/11/2014  . Rib pain on left side [R07.81] 10/15/2013  . Abnormal Pap smear [IMO0002]   . ASCUS (atypical squamous cells of undetermined significance) on Pap smear [R89.6] 07/24/2012  . Insomnia [G47.00] 12/20/2011  . Anemia [D64.9] 06/21/2011  . Gynecomastia [N62] 05/31/2011  . Zoster ophthalmicus [B02.30] 05/31/2011  . Headache(784.0) [R51] 02/15/2011  . Neuropathic pain [M79.2] 11/02/2010  . Cyst on ear [Q18.1] 11/02/2010  . ACUTE GASTRITIS WITHOUT MENTION OF HEMORRHAGE [K29.00] 12/29/2009  . CONSTIPATION [K59.00] 12/29/2009  . HERPES ZOSTER [B02.9] 12/07/2008  . NEURALGIA, TRIGEMINAL [G50.0] 12/07/2008  . ANEMIA, MACROCYTIC, CHRONIC [D53.9] 11/23/2008  . POLYURIA [R35.8] 11/23/2008  . NOCTURIA [R35.1] 11/23/2008  . GYNECOMASTIA [N62] 09/21/2008  . CARBUNCLE AND FURUNCLE OF UNSPECIFIED SITE [L02.92,  L02.93] 06/19/2008  . Leukocytopenia [D72.819] 03/16/2008  . ROSACEA [L71.9] 07/22/2007  . EDEMA [R60.9] 07/22/2007  . PAROXYSMAL NOCTURNAL DYSPNEA [R06.09, R09.89] 04/01/2007  . Pruritic disorder [L29.9] 01/24/2007  . DIARRHEA [R19.7] 01/24/2007  . Human immunodeficiency virus (HIV) disease ([place]) [B20] 01/13/2006    Total Time spent with patient: 45 minutes  Subjective:   Arthur Lynch is a 56 y.o. male patient admitted to medical floor for evaluation of infected Lip.  HPI:   Patient who reports history of Depression and Alcohol use disorder. He stated that he had 4 days of alcohol detoxification at [facility] from Tuesday to Friday when he was transferred to Medical floor to evaluate infected Lip. Today, he states that he is feeling great and ready to be discharged home. He denies SI/HI, depression, delusions or psychosis.  Past Psychiatric History: alcohol use disorder, depression  Risk to Self: Yes Risk to Others:  None Prior Inpatient Therapy:  Once Prior Outpatient Therapy:  yes  Past Medical History:  Past Medical History:  Diagnosis Date  . Abnormal Pap smear   . Alcoholism in remission ([place]) 08/16/2015  . Anemia   . Atypical chest pain 10/06/2015  . Cancer ([place])   . COPD (chronic obstructive pulmonary disease) ([place])   . Depression   . Eczema 10/06/2015  . HIV (human immunodeficiency virus infection) ([place])   . Murmur, heart   . Neuromuscular disorder ([place])   . Substance abuse     Past Surgical History:  Procedure Laterality Date  . ANOSCOPY     Family History: History reviewed. No pertinent family history. Family Psychiatric  History: mother and father with substance abuse Social History:  History  Alcohol Use No    Comment: quit per patient 5.15.17  History  Drug Use No    Social History   Social History  . Marital status: Single    Spouse name: N/A  . Number of children: N/A  . Years of education: N/A   Social History Main Topics  . Smoking  status: Never Smoker  . Smokeless tobacco: Never Used     Comment: Given condoms  . Alcohol use No     Comment: quit per patient 5.15.17  . Drug use: No  . Sexual activity: No     Comment: given condoms   Other Topics Concern  . None   Social History Narrative  . None   Additional Social History:    Allergies:   Allergies  Allergen Reactions  . Penicillins Itching    Has patient had a PCN reaction causing immediate rash, facial/tongue/throat swelling, SOB or lightheadedness with hypotension: No Has patient had a PCN reaction causing severe rash involving mucus membranes or skin necrosis: No Has patient had a PCN reaction that required hospitalization No Has patient had a PCN reaction occurring within the last 10 years: No If all of the above answers are "NO", then may proceed with Cephalosporin use.     Labs:  Results for orders placed or performed during the hospital encounter of 07/28/16 (from the past 48 hour(s))  Basic metabolic panel     Status: None   Collection Time: 07/29/16  6:27 AM  Result Value Ref Range   Sodium 136 135 - 145 mmol/L   Potassium 3.6 3.5 - 5.1 mmol/L    Comment: DELTA CHECK NOTED   Chloride 102 101 - 111 mmol/L   CO2 27 22 - 32 mmol/L   Glucose, Bld 95 65 - 99 mg/dL   BUN 10 6 - 20 mg/dL   Creatinine, Ser 0.97 0.61 - 1.24 mg/dL   Calcium 9.0 8.9 - 10.3 mg/dL   GFR calc non Af Amer >60 >60 mL/min   GFR calc Af Amer >60 >60 mL/min    Comment: (NOTE) The eGFR has been calculated using the CKD EPI equation. This calculation has not been validated in all clinical situations. eGFR's persistently <60 mL/min signify possible Chronic Kidney Disease.    Anion gap 7 5 - 15  CBC     Status: Abnormal   Collection Time: 07/29/16  6:27 AM  Result Value Ref Range   WBC 4.6 4.0 - 10.5 K/uL   RBC 3.03 (L) 4.22 - 5.81 MIL/uL   Hemoglobin 10.6 (L) 13.0 - 17.0 g/dL   HCT 30.9 (L) 39.0 - 52.0 %   MCV 102.0 (H) 78.0 - 100.0 fL   MCH 35.0 (H) 26.0 -  34.0 pg   MCHC 34.3 30.0 - 36.0 g/dL   RDW 12.9 11.5 - 15.5 %   Platelets 163 150 - 400 K/uL    Current Facility-Administered Medications  Medication Dose Route Frequency Provider Last Rate Last Dose  . 0.9 %  sodium chloride infusion  250 mL Intravenous PRN Jonetta Osgood, MD 10 mL/hr at 07/30/16 0623 250 mL at 07/30/16 0623  . abacavir-dolutegravir-lamiVUDine (TRIUMEQ) 600-50-300 MG per tablet 1 tablet  1 tablet Oral Daily Jonetta Osgood, MD   1 tablet at 07/30/16 [phone]  . acetaminophen (TYLENOL) tablet 650 mg  650 mg Oral Q6H PRN Ghimire, Henreitta Leber, MD       Or  . acetaminophen (TYLENOL) suppository 650 mg  650 mg Rectal Q6H PRN Ghimire, Henreitta Leber, MD      . albuterol (PROVENTIL) (  2.5 MG/3ML) 0.083% nebulizer solution 2.5 mg  2.5 mg Nebulization Q2H PRN Ghimire, Henreitta Leber, MD      . amitriptyline (ELAVIL) tablet 50 mg  50 mg Oral QHS Jonetta Osgood, MD   50 mg at 07/29/16 2135  . bacitracin ointment   Topical BID Jonetta Osgood, MD      . cefTRIAXone (ROCEPHIN) 1 g in dextrose 5 % 50 mL IVPB  1 g Intravenous Q24H Jonetta Osgood, MD 100 mL/hr at 07/30/16 0944 1 g at 07/30/16 0944  . enoxaparin (LOVENOX) injection 40 mg  40 mg Subcutaneous Q24H Jonetta Osgood, MD   40 mg at 07/29/16 2136  . feeding supplement (ENSURE ENLIVE) (ENSURE ENLIVE) liquid 237 mL  237 mL Oral q12n4p Jonetta Osgood, MD   237 mL at 07/30/16 0945  . folic acid (FOLVITE) tablet 1 mg  1 mg Oral Daily Jonetta Osgood, MD   1 mg at 07/30/16 2683  . hydrOXYzine (ATARAX/VISTARIL) tablet 25 mg  25 mg Oral TID PRN Jonetta Osgood, MD      . LORazepam (ATIVAN) tablet 1 mg  1 mg Oral Q6H PRN Jonetta Osgood, MD       Or  . LORazepam (ATIVAN) injection 1 mg  1 mg Intravenous Q6H PRN Jonetta Osgood, MD      . metroNIDAZOLE (FLAGYL) IVPB 500 mg  500 mg Intravenous Q8H Ghimire, Henreitta Leber, MD   Stopped at 07/30/16 [phone]  . multivitamin with minerals tablet 1 tablet  1 tablet Oral QHS Jonetta Osgood, MD   1 tablet at 07/29/16 2135  . ondansetron (ZOFRAN) tablet 4 mg  4 mg Oral Q6H PRN Ghimire, Henreitta Leber, MD       Or  . ondansetron (ZOFRAN) injection 4 mg  4 mg Intravenous Q6H PRN Ghimire, Henreitta Leber, MD      . sodium chloride flush (NS) 0.9 % injection 3 mL  3 mL Intravenous Q12H Jonetta Osgood, MD   3 mL at 07/30/16 0944  . sodium chloride flush (NS) 0.9 % injection 3 mL  3 mL Intravenous PRN Ghimire, Henreitta Leber, MD      . thiamine (VITAMIN B-1) tablet 100 mg  100 mg Oral Daily Jonetta Osgood, MD   100 mg at 07/30/16 2229   Or  . thiamine (B-1) injection 100 mg  100 mg Intravenous Daily Ghimire, Henreitta Leber, MD        Musculoskeletal: Strength & Muscle Tone: within normal limits Gait & Station: normal Patient leans: N/A  Psychiatric Specialty Exam: Physical Exam  Constitutional: He is oriented to person, place, and time. He appears well-developed and well-nourished.  HENT:  Head: Normocephalic.  Neck: Normal range of motion.  Respiratory: Effort normal.  Musculoskeletal: Normal range of motion.  Neurological: He is alert and oriented to person, place, and time.  Psychiatric: He has a normal mood and affect. His speech is normal and behavior is normal. Judgment and thought content normal. Cognition and memory are normal.    Review of Systems  Constitutional: Negative.   HENT: Negative.   Eyes: Negative.   Respiratory: Negative.   Cardiovascular: Negative.   Gastrointestinal: Negative.   Genitourinary: Negative.   Musculoskeletal: Negative.   Skin: Negative.   Neurological: Negative.   Endo/Heme/Allergies: Negative.   Psychiatric/Behavioral: Negative.   All other systems reviewed and are negative.   Blood pressure 116/86, pulse 88, temperature 98.6 F (37 C), temperature source Oral, resp. rate  16, height _0  (1.854 m), weight 59 kg (130 lb), SpO2 100 %.Body mass index is 17.15 kg/m.  General Appearance: Casual  Eye Contact:  Good  Speech:  Normal  Rate  Volume:  Normal  Mood:  Euthymic  Affect:  Appropriate  Thought Process:  Coherent and Descriptions of Associations: Intact  Orientation:  Full (Time, Place, and Person)  Thought Content:  Logical  Suicidal Thoughts:  No  Homicidal Thoughts:  No  Memory:  Immediate;   Good Recent;   Good Remote;   Good  Judgement:  Intact  Insight:  Fair  Psychomotor Activity:  Normal  Concentration:  Concentration: Good and Attention Span: Good  Recall:  Good  Fund of Knowledge:  Good  Language:  Good  Akathisia:  No  Handed:  Right  AIMS (if indicated):     Assets:  Communication Skills Desire for Improvement Leisure Time Resilience Social Support  ADL's:  Intact  Cognition:  WNL  Sleep:   good     Treatment Plan Summary: Plan Cleared by psychiatric service, at this time he is not a danger to self or others.  -Crisis stabilization -Medication management: Ativan alcohol detox protocol in place along with amitriptyline 50 mg at bedtime for sleep -Individual and substance abuse counseling  Disposition: No evidence of imminent risk to self or others at present.   Patient does not meet criteria for psychiatric inpatient admission. Refer to outpatient for medication management and Alcohol and drug services when medically cleared.  Corena Pilgrim, MD 07/30/2016 12:32 PM

## 2016-08-02 ENCOUNTER — Ambulatory Visit (INDEPENDENT_AMBULATORY_CARE_PROVIDER_SITE_OTHER): Payer: Self-pay | Admitting: Licensed Clinical Social Worker

## 2016-08-02 ENCOUNTER — Encounter (INDEPENDENT_AMBULATORY_CARE_PROVIDER_SITE_OTHER): Payer: Self-pay | Admitting: *Deleted

## 2016-08-02 VITALS — BP 120/84 | HR 108 | Temp 98.2°F | Wt 133.0 lb

## 2016-08-02 DIAGNOSIS — F1020 Alcohol dependence, uncomplicated: Secondary | ICD-10-CM

## 2016-08-02 DIAGNOSIS — Z006 Encounter for examination for normal comparison and control in clinical research program: Secondary | ICD-10-CM

## 2016-08-02 LAB — CULTURE, BLOOD (ROUTINE X 2)
CULTURE: NO GROWTH
CULTURE: NO GROWTH
Special Requests: ADEQUATE
Special Requests: ADEQUATE

## 2016-08-02 NOTE — Progress Notes (Signed)
Arthur Lynch is here for month 24 Reprieve visit, A Randomized Trial to Prevent Vascular Events in HIV. Recently discharged from hospital for ETOH detox and infected lower lip laceration from fall. Lower lip laceration is intact and healing. States that he is doing well since discharge and feeling good. Anxious to start OP follow-up/counseling. Stated that he has not heard anything regarding referral that was made at the time of his discharge from hospital to [facility] for follow-up. Had him meet with Sande Rives today and she was able to provide him with information regarding [facility]. Plans to make contact with [facility] today. No other complaints or concerns verbalized. Denied any muscle aches or weakness. He will return in September for his next study visit.

## 2016-08-08 ENCOUNTER — Telehealth: Payer: Self-pay | Admitting: *Deleted

## 2016-08-08 NOTE — Telephone Encounter (Signed)
Call from patient requesting that a copy of his quantiferon gold test be faxed to his employment at [phone]. It was done 06/2015 and advised that we do not do the TB skin test here anymore for employment. His employer stated she would accept it, even if it is just over a year old. If he needs it again, the options are occupational health at [facility] or the health department. Result faxed and confirmation received.

## 2016-08-14 ENCOUNTER — Telehealth: Payer: Self-pay | Admitting: *Deleted

## 2016-08-14 ENCOUNTER — Encounter (HOSPITAL_COMMUNITY): Payer: Self-pay | Admitting: Emergency Medicine

## 2016-08-14 ENCOUNTER — Ambulatory Visit (HOSPITAL_COMMUNITY)
Admission: EM | Admit: 2016-08-14 | Discharge: 2016-08-14 | Disposition: A | Discharge status: Home / Self Care | Payer: Self-pay | Attending: Emergency Medicine | Admitting: Emergency Medicine

## 2016-08-14 ENCOUNTER — Ambulatory Visit: Payer: Self-pay

## 2016-08-14 DIAGNOSIS — F329 Major depressive disorder, single episode, unspecified: Secondary | ICD-10-CM | POA: Insufficient documentation

## 2016-08-14 DIAGNOSIS — J449 Chronic obstructive pulmonary disease, unspecified: Secondary | ICD-10-CM | POA: Insufficient documentation

## 2016-08-14 DIAGNOSIS — Z859 Personal history of malignant neoplasm, unspecified: Secondary | ICD-10-CM | POA: Insufficient documentation

## 2016-08-14 DIAGNOSIS — L723 Sebaceous cyst: Secondary | ICD-10-CM | POA: Insufficient documentation

## 2016-08-14 DIAGNOSIS — B20 Human immunodeficiency virus [HIV] disease: Secondary | ICD-10-CM | POA: Insufficient documentation

## 2016-08-14 DIAGNOSIS — Z880 Allergy status to penicillin: Secondary | ICD-10-CM | POA: Insufficient documentation

## 2016-08-14 DIAGNOSIS — L089 Local infection of the skin and subcutaneous tissue, unspecified: Secondary | ICD-10-CM

## 2016-08-14 MED ORDER — SULFAMETHOXAZOLE-TRIMETHOPRIM 800-160 MG PO TABS: 2.0000 | ORAL_TABLET | Freq: Two times a day (BID) | ORAL | 0 refills | Status: DC

## 2016-08-14 NOTE — ED Provider Notes (Signed)
HPI  SUBJECTIVE:  Son Arthur Lynch is a 56 y.o. male who presents with an infected sebaceous cyst on his left earlobe. States that it rapidly increased in size over the past 2 days. He reports occasional pain described as stretching that lasts several minutes and then resolves. He has been taking nortriptyline which he takes for neuropathy from shingles with improvement in symptoms. Symptoms are worse with lying on it. He denies trauma, erythema, insect bite, fevers, swollen cervical lymph nodes, contacts with MRSA. No antipyretic in the past 6-8 hours. He has a past medical history of HIV, last CD4 count was in the 600s and his viral load is nondetectable. Also a history of alcohol abuse, recurrent sebaceous cyst in his left ear, shingles. No history of MRSA, diabetes. PMD:[place], Lavell Islam, MD     Past Medical History:  Diagnosis Date  . Abnormal Pap smear   . Alcoholism in remission ([place]) 08/16/2015  . Anemia   . Atypical chest pain 10/06/2015  . Cancer ([place])   . COPD (chronic obstructive pulmonary disease) ([place])   . Depression   . Eczema 10/06/2015  . HIV (human immunodeficiency virus infection) ([place])   . Murmur, heart   . Neuromuscular disorder ([place])   . Substance abuse     Past Surgical History:  Procedure Laterality Date  . ANOSCOPY      No family history on file.  Social History  Substance Use Topics  . Smoking status: Never Smoker  . Smokeless tobacco: Never Used     Comment: Given condoms  . Alcohol use No     Comment: quit per patient 5.15.17    No current facility-administered medications for this encounter.   Current Outpatient Prescriptions:  .  abacavir-dolutegravir-lamiVUDine (TRIUMEQ) 600-50-300 MG tablet, Take 1 tablet by mouth daily., Disp: 30 tablet, Rfl: 5 .  amitriptyline (ELAVIL) 50 MG tablet, TAKE 1 TABLET BY MOUTH AT BEDTIME, Disp: 30 tablet, Rfl: 5 .  folic acid (FOLVITE) 1 MG tablet, Take 1 tablet (1 mg total) by mouth daily., Disp: , Rfl:  .   Investigational - Study Medication, Take 1 capsule by mouth daily. Study name:  Additional study details: per pt HIV Study Medication, Disp: , Rfl:  .  Multiple Vitamin (MULTIVITAMIN WITH MINERALS) TABS tablet, Take 1 tablet by mouth at bedtime., Disp: , Rfl:  .  sulfamethoxazole-trimethoprim (BACTRIM DS,SEPTRA DS) 800-160 MG tablet, Take 2 tablets by mouth 2 (two) times daily., Disp: 20 tablet, Rfl: 0 .  thiamine 100 MG tablet, Take 1 tablet (100 mg total) by mouth daily., Disp: , Rfl:   Allergies  Allergen Reactions  . Penicillins Itching    Has patient had a PCN reaction causing immediate rash, facial/tongue/throat swelling, SOB or lightheadedness with hypotension: No Has patient had a PCN reaction causing severe rash involving mucus membranes or skin necrosis: No Has patient had a PCN reaction that required hospitalization No Has patient had a PCN reaction occurring within the last 10 years: No If all of the above answers are "NO", then may proceed with Cephalosporin use.      ROS  As noted in HPI.   Physical Exam  BP 123/80 (BP Location: Left Arm)   Pulse (!) 110 Comment: rn notified   Temp 98.2 F (36.8 C) (Oral)   Resp 14   SpO2 97%   Constitutional: Well developed, well nourished, no acute distress Eyes:  EOMI, conjunctiva normal bilaterally HENT: Normocephalic, atraumatic,mucus membranes moist Swollen and tender mass inferior left  earlobe with central fluctuance. See picture    Neck: Single nontender cervical lymph node inferior to the lesion, patient states that this was present before his symptoms started Respiratory: Normal inspiratory effort Cardiovascular: Normal rate GI: nondistended skin: No rash, skin intact Musculoskeletal: no deformities Neurologic: Alert & oriented x 3, no focal neuro deficits Psychiatric: Speech and behavior appropriate   ED Course   Medications - No data to display  Orders Placed This Encounter  Procedures  . Wound or  Superficial Culture    Standing Status:   Standing    Number of Occurrences:   1    Order Specific Question:   Patient immune status    Answer:   Immunocompromised    No results found for this or any previous visit (from the past 24 hour(s)). No results found.  ED Clinical Impression  Infected sebaceous cyst   ED Assessment/Plan  Procedure note: Cleaned area with alcohol and iodine. Use topical anesthetic spray. Using sterile technique Made a single stab incision with an 11 blade. Expressed an extensive amount of purulent, foul-smelling material. Explored wound with culture tip to break up any loculations. Wound culture sent. Pressure dressing placed afterwards. Patient tolerated procedure well.  Because of the HIV, I will send him home with Bactrim. Ibuprofen, Tylenol as needed for pain. Warm compresses, follow-up with his primary care physician in several days. We'll provider for referral to [place] [facility] for definitive removal.  Discussed MDM, plan and followup with patient. Discussed sn/sx that should prompt return to the ED. Patient agrees with plan.   Meds ordered this encounter  Medications  . sulfamethoxazole-trimethoprim (BACTRIM DS,SEPTRA DS) 800-160 MG tablet    Sig: Take 2 tablets by mouth 2 (two) times daily.    Dispense:  20 tablet    Refill:  0    *This clinic note was created using [employer]. Therefore, there may be occasional mistakes despite careful proofreading.  ?   Melynda Ripple, MD 08/14/16 1357

## 2016-08-14 NOTE — ED Triage Notes (Signed)
Cyst left ear lobe, noticed this site 2 days ago.  History of the same

## 2016-08-14 NOTE — Telephone Encounter (Signed)
Patient called c/o of a painful sebaceous cyst behind his ear. He wanted to come in to see Dr. Tommy Medal today to have it drained. Advised patient that there are no available appts at [facility] today and that this is a procedure requiring a thirty minute appt. Asked if he needed assistance in obtaining a PCP and he said that he just always comes here. He was transferred to [facility] for information on obtaining an orange card. He stated he would go to Urgent Care today regarding this issue.

## 2016-08-14 NOTE — Discharge Instructions (Signed)
Warm compresses, ibuprofen 600 mg 1 g of Tylenol 3-4 times a day as needed for pain.

## 2016-08-15 NOTE — Discharge Summary (Signed)
Brief Discharge Summary 56 year old single male, living with a cousin , employed  56 year old male, history of alcohol dependence, drinking one pint of liquor daily, presented voluntarily to ED requesting detoxification. History of prior episodes of " shaking, sweating" during withdrawals , but no history of withdrawal seizures or DTs  Prior to admission had fallen while intoxicated, resulting in lip laceration which required sutures . Patient did not develop significant symptoms of WDL. Mood improved with sobriety, and denied any suicidal ideations. Lip laceration sutures dehisced, and laceration presented inflamed and infected, with pus/drainage. Due to this, patient was transferred to [facility] for appropriate medical care, as IV antibiotic management was recommended .  Dx- Alcohol Dependence, Alcohol Induced Mood Disorder Plan- inpatient medical admission at [facility] for appropriate management of lip infection.  Gabriel Earing MD

## 2016-08-16 LAB — AEROBIC CULTURE  (SUPERFICIAL SPECIMEN)

## 2016-08-16 LAB — AEROBIC CULTURE W GRAM STAIN (SUPERFICIAL SPECIMEN)

## 2016-08-18 ENCOUNTER — Telehealth: Payer: Self-pay

## 2016-08-18 NOTE — Telephone Encounter (Signed)
Patient calling : message left on voice mail   He was told to call our office by the urgent care.  He was told Dr Lucianne Lei Dm wanted to see him as soon as possible.     Please advise

## 2016-08-20 ENCOUNTER — Telehealth (HOSPITAL_COMMUNITY): Payer: Self-pay | Admitting: Internal Medicine

## 2016-08-20 MED ORDER — DOXYCYCLINE HYCLATE 100 MG PO CAPS: 100.0000 mg | ORAL_CAPSULE | Freq: Two times a day (BID) | ORAL | 0 refills | Status: DC

## 2016-08-20 NOTE — Telephone Encounter (Signed)
Culture positive for actinomyces, patient allergic to PCN.  Will send rx for doxcycyline to pharmacy of record, [facility] [address].  Patient to followup with [facility].  LM

## 2016-08-21 NOTE — Telephone Encounter (Signed)
If it is possible to squeeze him in to my schedule on Wednesday. He grew an actinomyces from his culture and I wanted to get a look at site of abscess

## 2016-08-21 NOTE — Telephone Encounter (Signed)
Patient added to your schedule on 08/23/16 at 9:00 AM. He is aware of appointment date and time.

## 2016-08-21 NOTE — Telephone Encounter (Signed)
Perfect thanks Kennyth Lose

## 2016-08-22 NOTE — Progress Notes (Signed)
Name: Arthur Lynch   MRN: [id number]   Date: 08/02/16  Time started: 10:45 am    Time ended: 10:53 am  Behavior: Friendly and cooperative Mood: Elevated Affect: Within range Thought Process: Coherent Thought Content: Logical Thought Disorder and Perceptual Anomalies: None observed or self-reported S/I and H/I: None observed  Referral Source: [facility], Dr. Lucianne Lei Dam's patient  Content of Session: Met with patient based on referral from [facility], due to Alcohol use.  Intervention Provided: Provided contact information for outpatient drug treatment at [facility].  Encouraged patient to attend appointment and call back to confirm attendance.  Response: Patient reported that he was recently discharged from [facility] for detox due to Alcohol dependence, and needed treatment at a lower level of care.  Patient initially denied wanting contact information for [facility] agency because he thought [facility] was the same agency as [facility].  Patient was receptive that although they are located in the same building, they are separate agencies.  Patient was receptive to the walk-in intake process at [facility] and stated that he would report there today.  Patient was receptive to calling the [facility] to inform that received intake today.  Plan:  Patient will present to [facility] today before 2:30 pm to receive walk-in intake assessment.  Patient will remain compliant with substance use treatment recommendations.  Sande Rives, [facility]

## 2016-08-23 ENCOUNTER — Ambulatory Visit (INDEPENDENT_AMBULATORY_CARE_PROVIDER_SITE_OTHER): Payer: Self-pay | Admitting: Infectious Disease

## 2016-08-23 ENCOUNTER — Telehealth: Payer: Self-pay | Admitting: Infectious Diseases

## 2016-08-23 ENCOUNTER — Encounter: Payer: Self-pay | Admitting: Infectious Disease

## 2016-08-23 VITALS — BP 125/87 | HR 96 | Temp 98.2°F | Ht 72.0 in | Wt 136.0 lb

## 2016-08-23 DIAGNOSIS — K130 Diseases of lips: Secondary | ICD-10-CM

## 2016-08-23 DIAGNOSIS — F331 Major depressive disorder, recurrent, moderate: Secondary | ICD-10-CM

## 2016-08-23 DIAGNOSIS — A429 Actinomycosis, unspecified: Secondary | ICD-10-CM

## 2016-08-23 DIAGNOSIS — B20 Human immunodeficiency virus [HIV] disease: Secondary | ICD-10-CM

## 2016-08-23 DIAGNOSIS — G500 Trigeminal neuralgia: Secondary | ICD-10-CM

## 2016-08-23 DIAGNOSIS — F1024 Alcohol dependence with alcohol-induced mood disorder: Secondary | ICD-10-CM

## 2016-08-23 LAB — LIPID PANEL
CHOLESTEROL: 152 mg/dL (ref <200)
HDL: 99 mg/dL (ref >40)
LDL CALC: 44 mg/dL (ref <100)
TRIGLYCERIDES: 47 mg/dL (ref <150)
Total CHOL/HDL Ratio: 1.5 Ratio (ref <5.0)
VLDL: 9 mg/dL (ref <30)

## 2016-08-23 MED ORDER — DOXYCYCLINE HYCLATE 100 MG PO CAPS: 100.0000 mg | ORAL_CAPSULE | Freq: Two times a day (BID) | ORAL | 0 refills | Status: DC

## 2016-08-23 NOTE — Telephone Encounter (Signed)
Thank you :)

## 2016-08-23 NOTE — Telephone Encounter (Signed)
Test

## 2016-08-23 NOTE — Patient Instructions (Signed)
Cancel lab appt  Keep fu with [facility] and see  Me in July  Make appt with Juliann Pulse

## 2016-08-23 NOTE — Progress Notes (Signed)
Subjective:   Chief complaint : followup for ear abscess   Patient ID: Arthur Lynch, male    DOB: Feb 01, 1961, 56 y.o.   MRN: [id number]  HPI  Arthur Lynch is a 56 y.o. male who has  been doing superbly well on his  antiviral regimen, of  Triumeq.     Lab Results  Component Value Date   HIV1RNAQUANT <20 DETECTED (A) 07/10/2016   HIV1RNAQUANT <20 11/23/2015   HIV1RNAQUANT <20 08/17/2015     Lab Results  Component Value Date   CD4TABS 490 07/10/2016   CD4TABS 530 11/23/2015   CD4TABS 560 06/15/2015    Since I last saw him he has continued to have problems related to his depression and alcoholism. She actually fell and struck his face and had CT head and MF without contrast that did not show any fracture or pathology.  He tells me he missed one week of meds in march but he was <20 when we checked him in April  He is seeing Judeen Hammans and engaged in program to stop etoh.  In the interim he developed an infection on ear lobe and was seen by Dr. Alphonzo Cruise with urgent care who performed I and D which grew Actinomyces species on culture.  Dr. Alphonzo Cruise called me and I recommended PCN (but pt was allergic and so he did not fill this or the doxy that was called in instead)  [name] is improved post I and D. He has been seen by dental. On exam no obvious fistula from mouth to side of jaw.  Past Medical History:  Diagnosis Date  . Abnormal Pap smear   . Alcoholism in remission ([place]) 08/16/2015  . Anemia   . Atypical chest pain 10/06/2015  . Cancer ([place])   . COPD (chronic obstructive pulmonary disease) ([place])   . Depression   . Eczema 10/06/2015  . HIV (human immunodeficiency virus infection) ([place])   . Murmur, heart   . Neuromuscular disorder ([place])   . Substance abuse     Past Surgical History:  Procedure Laterality Date  . ANOSCOPY      No family history on file.    Social History   Social History  . Marital status: Single    Spouse name: N/A  . Number of children:  N/A  . Years of education: N/A   Social History Main Topics  . Smoking status: Never Smoker  . Smokeless tobacco: Never Used     Comment: Given condoms  . Alcohol use No     Comment: quit per patient 5.15.17  . Drug use: No  . Sexual activity: No     Comment: given condoms   Other Topics Concern  . None   Social History Narrative  . None    Allergies  Allergen Reactions  . Penicillins Itching    Has patient had a PCN reaction causing immediate rash, facial/tongue/throat swelling, SOB or lightheadedness with hypotension: No Has patient had a PCN reaction causing severe rash involving mucus membranes or skin necrosis: No Has patient had a PCN reaction that required hospitalization No Has patient had a PCN reaction occurring within the last 10 years: No If all of the above answers are "NO", then may proceed with Cephalosporin use.      Current Outpatient Prescriptions:  .  abacavir-dolutegravir-lamiVUDine (TRIUMEQ) 600-50-300 MG tablet, Take 1 tablet by mouth daily., Disp: 30 tablet, Rfl: 5 .  amitriptyline (ELAVIL) 50 MG tablet, TAKE 1 TABLET BY MOUTH AT BEDTIME, Disp:  30 tablet, Rfl: 5 .  doxycycline (VIBRAMYCIN) 100 MG capsule, Take 1 capsule (100 mg total) by mouth 2 (two) times daily., Disp: 20 capsule, Rfl: 0 .  folic acid (FOLVITE) 1 MG tablet, Take 1 tablet (1 mg total) by mouth daily., Disp: , Rfl:  .  Investigational - Study Medication, Take 1 capsule by mouth daily. Study name:  Additional study details: per pt HIV Study Medication, Disp: , Rfl:  .  Multiple Vitamin (MULTIVITAMIN WITH MINERALS) TABS tablet, Take 1 tablet by mouth at bedtime., Disp: , Rfl:  .  sulfamethoxazole-trimethoprim (BACTRIM DS,SEPTRA DS) 800-160 MG tablet, Take 2 tablets by mouth 2 (two) times daily., Disp: 20 tablet, Rfl: 0 .  thiamine 100 MG tablet, Take 1 tablet (100 mg total) by mouth daily., Disp: , Rfl:     Review of Systems  Constitutional: Negative for activity change, appetite  change and unexpected weight change.  HENT: Negative for sinus pressure, sneezing and trouble swallowing.   Eyes: Negative for photophobia and visual disturbance.  Respiratory: Negative for chest tightness, wheezing and stridor.   Cardiovascular: Negative for chest pain, palpitations and leg swelling.  Gastrointestinal: Negative for abdominal distention, anal bleeding, blood in stool, constipation and nausea.  Genitourinary: Negative for difficulty urinating, dysuria, flank pain and hematuria.  Musculoskeletal: Negative for arthralgias, back pain, gait problem, joint swelling and myalgias.  Skin: Positive for wound. Negative for color change and pallor.  Neurological: Positive for numbness. Negative for dizziness, tremors, weakness and light-headedness.  Hematological: Negative for adenopathy. Does not bruise/bleed easily.  Psychiatric/Behavioral: Positive for behavioral problems and dysphoric mood. Negative for agitation and confusion.       Objective:   Physical Exam  Constitutional: He is oriented to person, place, and time. He appears well-developed and well-nourished. No distress.  HENT:  Head: Normocephalic and atraumatic.  Mouth/Throat: Uvula is midline and oropharynx is clear and moist. Abnormal dentition. Dental caries present. No dental abscesses or uvula swelling. No oropharyngeal exudate or posterior oropharyngeal edema.  Eyes: Conjunctivae and EOM are normal. Pupils are equal, round, and reactive to light. No scleral icterus.  Neck: Normal range of motion. Neck supple. No JVD present.  Cardiovascular: Normal rate and regular rhythm.   Pulmonary/Chest: Effort normal. No respiratory distress. He has no wheezes.  Abdominal: Soft. He exhibits no distension. There is no tenderness. There is no guarding.  Musculoskeletal: He exhibits no edema or tenderness.  Lymphadenopathy:    He has no cervical adenopathy.  Neurological: He is alert and oriented to person, place, and time. He  has normal reflexes. He exhibits normal muscle tone. Coordination normal.  Skin: Skin is warm. No rash noted. He is not diaphoretic.  Psychiatric: He has a normal mood and affect. His behavior is normal. Judgment and thought content normal.   Ear where he had I and D 08/23/16:              Assessment & Plan:    Actinomyes infection of ear: I asked him to take his doxy rx. He is to followup with dental. I WILL NOT pursure imaging to look for fistula or connection to OP since there is no obvious evidence for this, he had recent imaging and it might be burden financially. He is to watch closely   HIV: Doing very well on [place] control. We will recheck labs and have him renew ADAP in July  Alcholism and depression  Needs to see SHerry  Abnormal anal pap: needs to get  into Dr. Algis Downs [facility] clinic  I spent greater than 25 minutes with the patient including greater than 50% of time in face to face counsel of the patient re actinomyces, his alcoholism and depression, HIV, and in coordination of his care.

## 2016-08-24 LAB — RPR

## 2016-08-24 LAB — URINE CYTOLOGY ANCILLARY ONLY
Chlamydia: NEGATIVE
Neisseria Gonorrhea: NEGATIVE

## 2016-08-24 LAB — T-HELPER CELL (CD4) - (RCID CLINIC ONLY)
CD4 % Helper T Cell: 34 % (ref 33–55)
CD4 T CELL ABS: 630 /uL (ref 400–2700)

## 2016-08-25 LAB — QUANTIFERON TB GOLD ASSAY (BLOOD)
Interferon Gamma Release Assay: NEGATIVE
Mitogen-Nil: >10 IU/mL
Quantiferon Nil Value: 0.11 IU/mL
Quantiferon Tb Ag Minus Nil Value: 0.01 IU/mL

## 2016-08-29 LAB — HIV-1 RNA QUANT-NO REFLEX-BLD
HIV 1 RNA QUANT: NOT DETECTED {copies}/mL
HIV-1 RNA Quant, Log: <1.3 Log copies/mL

## 2016-08-31 ENCOUNTER — Other Ambulatory Visit: Payer: Self-pay

## 2016-09-14 ENCOUNTER — Ambulatory Visit: Payer: Self-pay

## 2016-09-14 ENCOUNTER — Ambulatory Visit: Payer: Self-pay | Admitting: Infectious Disease

## 2016-09-18 ENCOUNTER — Telehealth: Payer: Self-pay | Admitting: *Deleted

## 2016-09-18 NOTE — Telephone Encounter (Signed)
Patient called requesting that his quantiferon gold test results be faxed to [place] at [phone]. Results faxed and confirmation received.

## 2016-09-19 ENCOUNTER — Ambulatory Visit (INDEPENDENT_AMBULATORY_CARE_PROVIDER_SITE_OTHER): Payer: Self-pay | Admitting: Licensed Clinical Social Worker

## 2016-09-19 DIAGNOSIS — F4321 Adjustment disorder with depressed mood: Secondary | ICD-10-CM

## 2016-09-19 DIAGNOSIS — F1020 Alcohol dependence, uncomplicated: Secondary | ICD-10-CM

## 2016-09-19 NOTE — Progress Notes (Signed)
Integrated Behavioral Health Follow Up Visit  MRN: [id number] Name: Arthur Lynch   Session Start time: 10:30 am Session End time: 11:30 am Total time: 1 hour Number of Integrated Behavioral Health Clinician visits: 2/10  Type of Service: [place] Interpretor:No. Interpretor Name and Language: N/A   Warm Hand Off Completed.       SUBJECTIVE: Arthur Lynch is a 56 y.o. male accompanied by patient. Patient is a patient of Dr. Tommy Medal and was referred by [facility] for depressive symptoms.  Patient reports the following symptoms/concerns: Patient reported depressed mood related to his living situation.  Patient reported difficulty adjusting to unemployment, lack of finances, and having to live with his family.  Patient also reported dysfunctional family interaction patterns, which is making the living situation uncomfortable.  Patient was able to verbalize his goals for the future of returning to work and living independently again.  Patient denied current mental health treatment but reported that he has an appointment with [facility] for Alcohol use on Wednesday July 11 at 11am.   Severity of problem: mild  OBJECTIVE: Mood: Depressed and Affect: appropriate and within range Risk of harm to self or others: No plan to harm self or others  Thought process: coherent Thought content: logical   LIFE CONTEXT: Family and Social: Patient lives with uncle and other family members. School/Work: Patient is currently unemployed but seeking work. Self-Care: Patient is able to tend to his ADL's. Life Changes: Patient is experiencing difficulty adjusting to new living situation.  GOALS ADDRESSED: Patient will reduce symptoms of: depression and increase knowledge and/or ability of: coping skills and stress reduction and also: Increase healthy adjustment to current life circumstances and Decrease self-medicating behaviors  INTERVENTIONS: Solution-Focused  Strategies  Reviewed coping strategies of the power of positive thinking, reviewing the need for excessive control, reviewing the need for increased acceptance, and making a list of things that make him happy.  ASSESSMENT: Patient currently experiencing depressed mood and difficulty adjusting to new living situation and may benefit from mental health counseling.  PLAN: 1. Patient has scheduled appointment with [facility] for 7/11 at 11am. 2. Patient will attend appointment and receive substance counseling and mental health counseling. 3. Behavioral recommendations: Use coping strategies addressed above. 4. Patient will follow-up with the [facility] by phone in the next two weeks to ensure attended appointment.   Sande Rives, [facility]

## 2016-09-25 ENCOUNTER — Other Ambulatory Visit: Payer: Self-pay | Admitting: *Deleted

## 2016-09-25 ENCOUNTER — Telehealth: Payer: Self-pay | Admitting: *Deleted

## 2016-09-25 MED ORDER — ENSURE PO LIQD: 237.0000 mL | Freq: Two times a day (BID) | ORAL | 11 refills | Status: DC

## 2016-09-25 NOTE — Telephone Encounter (Signed)
Patient called to ask for a new Rx for Ensure to be faxed to [facility]. It is no longer on his med list, however his BMI is below 19. New Rx printed, signed and faxed to [facility]. Myrtis Hopping

## 2016-10-05 ENCOUNTER — Other Ambulatory Visit: Payer: Self-pay | Admitting: *Deleted

## 2016-10-05 DIAGNOSIS — B20 Human immunodeficiency virus [HIV] disease: Secondary | ICD-10-CM

## 2016-10-05 MED ORDER — ENSURE PO LIQD: 237.0000 mL | Freq: Two times a day (BID) | ORAL | 11 refills | Status: DC

## 2016-10-10 ENCOUNTER — Encounter: Payer: Self-pay | Admitting: Infectious Disease

## 2016-10-18 ENCOUNTER — Other Ambulatory Visit: Payer: Self-pay

## 2016-10-18 DIAGNOSIS — B20 Human immunodeficiency virus [HIV] disease: Secondary | ICD-10-CM

## 2016-10-18 LAB — CBC WITH DIFFERENTIAL/PLATELET
BASOS PCT: 1 %
Basophils Absolute: 35 cells/uL (ref 0–200)
EOS ABS: 35 {cells}/uL (ref 15–500)
Eosinophils Relative: 1 %
HEMATOCRIT: 35.1 % — AB (ref 38.5–50.0)
HEMOGLOBIN: 11.6 g/dL — AB (ref 13.2–17.1)
LYMPHS ABS: 1925 {cells}/uL (ref 850–3900)
LYMPHS PCT: 55 %
MCH: 34.6 pg — ABNORMAL HIGH (ref 27.0–33.0)
MCHC: 33 g/dL (ref 32.0–36.0)
MCV: 104.8 fL — AB (ref 80.0–100.0)
MONO ABS: 315 {cells}/uL (ref 200–950)
MPV: 9.1 fL (ref 7.5–12.5)
Monocytes Relative: 9 %
Neutro Abs: 1190 cells/uL — ABNORMAL LOW (ref 1500–7800)
Neutrophils Relative %: 34 %
Platelets: 229 10*3/uL (ref 140–400)
RBC: 3.35 MIL/uL — AB (ref 4.20–5.80)
RDW: 13.2 % (ref 11.0–15.0)
WBC: 3.5 10*3/uL — AB (ref 3.8–10.8)

## 2016-10-19 LAB — T-HELPER CELL (CD4) - (RCID CLINIC ONLY)
CD4 T CELL ABS: 780 /uL (ref 400–2700)
CD4 T CELL HELPER: 37 % (ref 33–55)

## 2016-10-19 LAB — RPR

## 2016-10-19 LAB — LIPID PANEL
CHOLESTEROL: 176 mg/dL (ref <200)
HDL: 98 mg/dL (ref >40)
LDL Cholesterol: 66 mg/dL (ref <100)
TRIGLYCERIDES: 61 mg/dL (ref <150)
Total CHOL/HDL Ratio: 1.8 Ratio (ref <5.0)
VLDL: 12 mg/dL (ref <30)

## 2016-10-19 LAB — COMPLETE METABOLIC PANEL WITH GFR
ALBUMIN: 4.4 g/dL (ref 3.6–5.1)
ALK PHOS: 74 U/L (ref 40–115)
ALT: 23 U/L (ref 9–46)
AST: 35 U/L (ref 10–35)
BILIRUBIN TOTAL: 0.4 mg/dL (ref 0.2–1.2)
BUN: 24 mg/dL (ref 7–25)
CO2: 24 mmol/L (ref 20–32)
Calcium: 9.9 mg/dL (ref 8.6–10.3)
Chloride: 102 mmol/L (ref 98–110)
Creat: 1.25 mg/dL (ref 0.70–1.33)
GFR, EST AFRICAN AMERICAN: 76 mL/min (ref >=60)
GFR, EST NON AFRICAN AMERICAN: 65 mL/min (ref >=60)
Glucose, Bld: 83 mg/dL (ref 65–99)
POTASSIUM: 4.3 mmol/L (ref 3.5–5.3)
Sodium: 140 mmol/L (ref 135–146)
TOTAL PROTEIN: 7.7 g/dL (ref 6.1–8.1)

## 2016-10-19 LAB — URINE CYTOLOGY ANCILLARY ONLY
CHLAMYDIA, DNA PROBE: NEGATIVE
Neisseria Gonorrhea: NEGATIVE

## 2016-10-21 LAB — HIV-1 RNA QUANT-NO REFLEX-BLD
HIV 1 RNA Quant: 227 copies/mL — ABNORMAL HIGH
HIV-1 RNA Quant, Log: 2.36 Log copies/mL — ABNORMAL HIGH

## 2016-10-25 ENCOUNTER — Other Ambulatory Visit: Payer: Self-pay

## 2016-10-28 ENCOUNTER — Other Ambulatory Visit: Payer: Self-pay | Admitting: Infectious Disease

## 2016-11-01 ENCOUNTER — Ambulatory Visit: Payer: Self-pay | Admitting: Infectious Disease

## 2016-11-20 ENCOUNTER — Ambulatory Visit: Payer: Self-pay | Admitting: Infectious Disease

## 2016-11-29 ENCOUNTER — Encounter: Payer: Self-pay | Admitting: *Deleted

## 2016-12-14 ENCOUNTER — Encounter: Payer: Self-pay | Admitting: *Deleted

## 2016-12-15 ENCOUNTER — Telehealth: Payer: Self-pay

## 2016-12-15 NOTE — Telephone Encounter (Signed)
Attempted to get in contact with Mr. Laura to schedule an appointment for an Anal Pap. Was unsuccessful and unable to leave a message.   Aundria Rud, [place]'

## 2017-01-03 ENCOUNTER — Encounter: Payer: Self-pay | Admitting: Licensed Clinical Social Worker

## 2017-01-03 ENCOUNTER — Ambulatory Visit: Payer: Self-pay | Admitting: Infectious Disease

## 2017-01-06 ENCOUNTER — Other Ambulatory Visit: Payer: Self-pay | Admitting: Infectious Disease

## 2017-01-06 DIAGNOSIS — B20 Human immunodeficiency virus [HIV] disease: Secondary | ICD-10-CM

## 2017-01-08 ENCOUNTER — Encounter: Payer: Self-pay | Admitting: *Deleted

## 2017-01-09 ENCOUNTER — Encounter (INDEPENDENT_AMBULATORY_CARE_PROVIDER_SITE_OTHER): Payer: Self-pay | Admitting: *Deleted

## 2017-01-09 VITALS — BP 106/73 | HR 93 | Temp 98.3°F | Wt 137.5 lb

## 2017-01-09 DIAGNOSIS — Z23 Encounter for immunization: Secondary | ICD-10-CM

## 2017-01-09 DIAGNOSIS — Z006 Encounter for examination for normal comparison and control in clinical research program: Secondary | ICD-10-CM

## 2017-01-09 NOTE — Progress Notes (Signed)
Arthur Lynch is here for his month 28 visit for Reprieve, A Randomized Trial to Prevent Vascular Events in HIV (study drug is Pitavastatin 4mg  or placebo).  He started a new job at [employer] and has had trouble scheduling appointments, so this visit is a little late. He denies any new health problems or concerns and denies any muscle aches or weakness. He says his adherence is good. He has an appointment to see Dr. Tommy Medal next month and needs to come in and get his blood work first. He will be returning for study in January.

## 2017-02-07 ENCOUNTER — Ambulatory Visit: Payer: Self-pay | Admitting: Infectious Disease

## 2017-03-27 ENCOUNTER — Encounter: Payer: Self-pay | Admitting: Licensed Clinical Social Worker

## 2017-04-04 ENCOUNTER — Encounter (INDEPENDENT_AMBULATORY_CARE_PROVIDER_SITE_OTHER): Payer: Self-pay | Admitting: *Deleted

## 2017-04-04 ENCOUNTER — Ambulatory Visit (INDEPENDENT_AMBULATORY_CARE_PROVIDER_SITE_OTHER): Payer: Self-pay | Admitting: Infectious Disease

## 2017-04-04 ENCOUNTER — Encounter: Payer: Self-pay | Admitting: Infectious Disease

## 2017-04-04 VITALS — BP 101/70 | HR 101 | Temp 98.2°F | Ht 74.0 in | Wt 141.0 lb

## 2017-04-04 DIAGNOSIS — Z23 Encounter for immunization: Secondary | ICD-10-CM

## 2017-04-04 DIAGNOSIS — Z79899 Other long term (current) drug therapy: Secondary | ICD-10-CM

## 2017-04-04 DIAGNOSIS — Z113 Encounter for screening for infections with a predominantly sexual mode of transmission: Secondary | ICD-10-CM

## 2017-04-04 DIAGNOSIS — B0230 Zoster ocular disease, unspecified: Secondary | ICD-10-CM

## 2017-04-04 DIAGNOSIS — Z006 Encounter for examination for normal comparison and control in clinical research program: Secondary | ICD-10-CM

## 2017-04-04 DIAGNOSIS — B20 Human immunodeficiency virus [HIV] disease: Secondary | ICD-10-CM

## 2017-04-04 DIAGNOSIS — F1021 Alcohol dependence, in remission: Secondary | ICD-10-CM

## 2017-04-04 MED ORDER — ABACAVIR-DOLUTEGRAVIR-LAMIVUD 600-50-300 MG PO TABS: 1.0000 | ORAL_TABLET | Freq: Every day | ORAL | 11 refills | Status: DC

## 2017-04-04 MED ORDER — ABACAVIR-DOLUTEGRAVIR-LAMIVUD 600-50-300 MG PO TABS: 1.0000 | ORAL_TABLET | Freq: Every day | ORAL | 5 refills | Status: DC

## 2017-04-04 NOTE — Progress Notes (Signed)
Subjective:   Chief complaint : Relapse of depression and problems with alcoholism   Patient ID: Arthur Lynch, male    DOB: 08/04/1960, 57 y.o.   MRN: [id number]  HPI  Arthur Lynch is a 57 y.o. male who had been doing superbly well on his  antiviral regimen, of  Triumeq.     Lab Results  Component Value Date   HIV1RNAQUANT 660 (H) 10/18/2016   HIV1RNAQUANT <20 NOT DETECTED 08/23/2016   HIV1RNAQUANT <20 DETECTED (A) 07/10/2016     Lab Results  Component Value Date   CD4TABS 780 10/18/2016   CD4TABS 630 08/23/2016   CD4TABS 490 07/10/2016   Since I last saw him he has had significant problems with depression and also relapse of his alcoholism.  This relates to a good friend of his who was in intimate relationship with Arthur Lynch and who had inspired Arthur Lynch quite a bit.  More recently that individual had been a "mooch" possibly asking Arthur Lynch for money and other assistance.  He is also had trouble with family members in similar types of situation.  Recently been bitten by dog on his jacket but not sustained a bite through the skin.  He lives with his cousin who is dog also does not like him.  He has not been taking his antiretrovirals for the.  Of late November through mid December he started back on [place] at the end of December has been on it since then.  He is quite happy to meet with Arthur Lynch today discussed optimization of treatment of his depression and his problems with polysubstance abuse.  He is making efforts to reduce his alcohol intake but it is still an issue and he says that he is being completely open and honest with me and I believe him.    Past Medical History:  Diagnosis Date  . Abnormal Pap smear   . Alcoholism in remission ([place]) 08/16/2015  . Anemia   . Atypical chest pain 10/06/2015  . Cancer ([place])   . COPD (chronic obstructive pulmonary disease) ([place])   . Depression   . Eczema 10/06/2015  . HIV (human immunodeficiency virus infection) ([place])   . Murmur,  heart   . Neuromuscular disorder ([place])   . Substance abuse [facility])     Past Surgical History:  Procedure Laterality Date  . ANOSCOPY      No family history on file.    Social History   Socioeconomic History  . Marital status: Single    Spouse name: None  . Number of children: None  . Years of education: None  . Highest education level: None  Social Needs  . Financial resource strain: None  . Food insecurity - worry: None  . Food insecurity - inability: None  . Transportation needs - medical: None  . Transportation needs - non-medical: None  Occupational History  . None  Tobacco Use  . Smoking status: Never Smoker  . Smokeless tobacco: Never Used  . Tobacco comment: Given condoms  Substance and Sexual Activity  . Alcohol use: No    Alcohol/week: 0.0 oz    Comment: quit per patient 5.15.17  . Drug use: No  . Sexual activity: No    Comment: given condoms  Other Topics Concern  . None  Social History Narrative  . None    Allergies  Allergen Reactions  . Penicillins Itching    Has patient had a PCN reaction causing immediate rash, facial/tongue/throat swelling, SOB or lightheadedness with hypotension: No Has  patient had a PCN reaction causing severe rash involving mucus membranes or skin necrosis: No Has patient had a PCN reaction that required hospitalization No Has patient had a PCN reaction occurring within the last 10 years: No If all of the above answers are "NO", then may proceed with Cephalosporin use.      Current Outpatient Medications:  .  abacavir-dolutegravir-lamiVUDine (TRIUMEQ) 600-50-300 MG tablet, Take 1 tablet by mouth daily., Disp: 30 tablet, Rfl: 5 .  amitriptyline (ELAVIL) 50 MG tablet, TAKE 1 TABLET BY MOUTH AT BEDTIME, Disp: 30 tablet, Rfl: 2 .  ENSURE (ENSURE), Take 237 mLs by mouth 2 (two) times daily between meals., Disp: 237 mL, Rfl: 11 .  TRIUMEQ 600-50-300 MG tablet, TAKE 1 TABLET BY MOUTH DAILY, Disp: 30 tablet, Rfl: 5 .   doxycycline (VIBRAMYCIN) 100 MG capsule, Take 1 capsule (100 mg total) by mouth 2 (two) times daily. (Patient not taking: Reported on 01/09/2017), Disp: 28 capsule, Rfl: 0 .  folic acid (FOLVITE) 1 MG tablet, Take 1 tablet (1 mg total) by mouth daily. (Patient not taking: Reported on 01/09/2017), Disp: , Rfl:  .  Investigational - Study Medication, Take 1 capsule by mouth daily. Study name:  Additional study details: per pt HIV Study Medication, Disp: , Rfl:  .  Multiple Vitamin (MULTIVITAMIN WITH MINERALS) TABS tablet, Take 1 tablet by mouth at bedtime. (Patient not taking: Reported on 01/09/2017), Disp: , Rfl:  .  thiamine 100 MG tablet, Take 1 tablet (100 mg total) by mouth daily. (Patient not taking: Reported on 01/09/2017), Disp: , Rfl:     Review of Systems  Constitutional: Negative for activity change, appetite change and unexpected weight change.  HENT: Negative for sinus pressure, sneezing and trouble swallowing.   Eyes: Negative for photophobia and visual disturbance.  Respiratory: Negative for chest tightness, wheezing and stridor.   Cardiovascular: Negative for chest pain, palpitations and leg swelling.  Gastrointestinal: Negative for abdominal distention, anal bleeding, blood in stool, constipation and nausea.  Genitourinary: Negative for difficulty urinating, dysuria, flank pain and hematuria.  Musculoskeletal: Negative for arthralgias, back pain, gait problem, joint swelling and myalgias.  Skin: Negative for color change, pallor and wound.  Neurological: Positive for numbness. Negative for dizziness, tremors, weakness and light-headedness.  Hematological: Negative for adenopathy. Does not bruise/bleed easily.  Psychiatric/Behavioral: Positive for behavioral problems and dysphoric mood. Negative for agitation, confusion, self-injury and suicidal ideas. The patient is nervous/anxious.        Objective:   Physical Exam  Constitutional: He is oriented to person, place, and time.  He appears well-developed and well-nourished. No distress.  HENT:  Head: Normocephalic and atraumatic.  Mouth/Throat: Uvula is midline and oropharynx is clear and moist. Abnormal dentition. Dental caries present. No dental abscesses or uvula swelling. No oropharyngeal exudate or posterior oropharyngeal edema.  Eyes: Conjunctivae and EOM are normal. Pupils are equal, round, and reactive to light. No scleral icterus.  Neck: Normal range of motion. Neck supple. No JVD present.  Cardiovascular: Normal rate and regular rhythm.  Pulmonary/Chest: Effort normal. No respiratory distress. He has no wheezes.  Abdominal: Soft. He exhibits no distension. There is no tenderness. There is no guarding.  Musculoskeletal: He exhibits no edema or tenderness.  Lymphadenopathy:    He has no cervical adenopathy.  Neurological: He is alert and oriented to person, place, and time. He has normal reflexes. He exhibits normal muscle tone. Coordination normal.  Skin: Skin is warm. No rash noted. He is not diaphoretic.  Psychiatric:  His behavior is normal. Judgment and thought content normal. He exhibits a depressed mood.       Assessment & Plan:   HIV: He will renew ADAP today recheck labs today and see him in 1 month's time hopefully is not developed neurological failure with resistance.  I doubt that he has more an issue at times to me to be problem of him battling depression and substance abuse  Alcholism and depression : asked that he sees Arthur Lynch  Abnormal anal pap: needs to get into Dr. Algis Downs [facility] clinic   I spent greater than 25 minutes with the patient including greater than 50% of time in face to face counsel of the patient regarding his recent interpersonal problems and triggers of depression and alcohol abuse that have occurred in supportive therapy of the patient and and in coordination of his care.

## 2017-04-04 NOTE — Progress Notes (Signed)
Arthur Lynch is here for his month 1 visit for Reprieve. He also saw Dr. Tommy Medal today. He says his adherence with the study med is very good and denies any current muscle aches or weakness. He will be returning in May for the next study visit/

## 2017-04-05 ENCOUNTER — Encounter: Payer: Self-pay | Admitting: *Deleted

## 2017-04-05 LAB — CBC WITH DIFFERENTIAL/PLATELET
Basophils Absolute: 19 cells/uL (ref 0–200)
Basophils Relative: 0.6 %
Eosinophils Absolute: 80 cells/uL (ref 15–500)
Eosinophils Relative: 2.5 %
HCT: 33.7 % — ABNORMAL LOW (ref 38.5–50.0)
Hemoglobin: 11.9 g/dL — ABNORMAL LOW (ref 13.2–17.1)
Lymphs Abs: 1802 cells/uL (ref 850–3900)
MCH: 36 pg — ABNORMAL HIGH (ref 27.0–33.0)
MCHC: 35.3 g/dL (ref 32.0–36.0)
MCV: 101.8 fL — ABNORMAL HIGH (ref 80.0–100.0)
MPV: 9.4 fL (ref 7.5–12.5)
Monocytes Relative: 10 %
Neutro Abs: 979 cells/uL — ABNORMAL LOW (ref 1500–7800)
Neutrophils Relative %: 30.6 %
Platelets: 294 10*3/uL (ref 140–400)
RBC: 3.31 10*6/uL — ABNORMAL LOW (ref 4.20–5.80)
RDW: 11.9 % (ref 11.0–15.0)
Total Lymphocyte: 56.3 %
WBC mixed population: 320 cells/uL (ref 200–950)
WBC: 3.2 10*3/uL — ABNORMAL LOW (ref 3.8–10.8)

## 2017-04-05 LAB — T-HELPER CELL (CD4) - (RCID CLINIC ONLY)
CD4 T CELL HELPER: 32 % — AB (ref 33–55)
CD4 T Cell Abs: 600 /uL (ref 400–2700)

## 2017-04-05 LAB — RPR: RPR: NONREACTIVE

## 2017-04-05 LAB — COMPLETE METABOLIC PANEL WITH GFR
AG Ratio: 1.2 (calc) (ref 1.0–2.5)
ALT: 14 U/L (ref 9–46)
AST: 26 U/L (ref 10–35)
Albumin: 3.8 g/dL (ref 3.6–5.1)
Alkaline phosphatase (APISO): 67 U/L (ref 40–115)
BUN: 16 mg/dL (ref 7–25)
CO2: 28 mmol/L (ref 20–32)
Calcium: 9 mg/dL (ref 8.6–10.3)
Chloride: 108 mmol/L (ref 98–110)
Creat: 1.04 mg/dL (ref 0.70–1.33)
GFR, Est African American: 94 mL/min/{1.73_m2} (ref >=60)
GFR, Est Non African American: 81 mL/min/{1.73_m2} (ref >=60)
Globulin: 3.2 g/dL (calc) (ref 1.9–3.7)
Glucose, Bld: 86 mg/dL (ref 65–99)
Potassium: 4 mmol/L (ref 3.5–5.3)
Sodium: 143 mmol/L (ref 135–146)
Total Bilirubin: 0.3 mg/dL (ref 0.2–1.2)
Total Protein: 7 g/dL (ref 6.1–8.1)

## 2017-04-10 ENCOUNTER — Encounter: Payer: Self-pay | Admitting: Infectious Disease

## 2017-04-10 LAB — HIV RNA, RTPCR W/R GT (RTI, PI,INT)
HIV 1 RNA Quant: 87 copies/mL — ABNORMAL HIGH
HIV-1 RNA Quant, Log: 1.94 Log copies/mL — ABNORMAL HIGH

## 2017-04-21 ENCOUNTER — Other Ambulatory Visit: Payer: Self-pay | Admitting: Infectious Disease

## 2017-05-07 ENCOUNTER — Ambulatory Visit: Payer: Self-pay | Admitting: Infectious Disease

## 2017-07-23 ENCOUNTER — Encounter: Payer: Self-pay | Admitting: *Deleted

## 2017-07-31 ENCOUNTER — Encounter: Payer: Self-pay | Admitting: *Deleted

## 2017-08-03 ENCOUNTER — Other Ambulatory Visit: Payer: Self-pay

## 2017-08-17 ENCOUNTER — Ambulatory Visit: Payer: Self-pay | Admitting: Infectious Disease

## 2017-12-28 ENCOUNTER — Other Ambulatory Visit: Payer: Self-pay | Admitting: Behavioral Health

## 2017-12-28 ENCOUNTER — Other Ambulatory Visit: Payer: Self-pay | Admitting: Infectious Disease

## 2017-12-28 DIAGNOSIS — B20 Human immunodeficiency virus [HIV] disease: Secondary | ICD-10-CM

## 2018-04-06 ENCOUNTER — Emergency Department (HOSPITAL_COMMUNITY)
Admission: EM | Admit: 2018-04-06 | Discharge: 2018-04-07 | Disposition: A | Discharge status: Home / Self Care | Payer: Self-pay | Attending: Emergency Medicine | Admitting: Emergency Medicine

## 2018-04-06 ENCOUNTER — Emergency Department (HOSPITAL_COMMUNITY): Payer: Self-pay

## 2018-04-06 DIAGNOSIS — Z23 Encounter for immunization: Secondary | ICD-10-CM | POA: Insufficient documentation

## 2018-04-06 DIAGNOSIS — Y929 Unspecified place or not applicable: Secondary | ICD-10-CM | POA: Insufficient documentation

## 2018-04-06 DIAGNOSIS — F10920 Alcohol use, unspecified with intoxication, uncomplicated: Secondary | ICD-10-CM | POA: Insufficient documentation

## 2018-04-06 DIAGNOSIS — S01511A Laceration without foreign body of lip, initial encounter: Secondary | ICD-10-CM | POA: Insufficient documentation

## 2018-04-06 DIAGNOSIS — Z79899 Other long term (current) drug therapy: Secondary | ICD-10-CM | POA: Insufficient documentation

## 2018-04-06 DIAGNOSIS — B20 Human immunodeficiency virus [HIV] disease: Secondary | ICD-10-CM | POA: Insufficient documentation

## 2018-04-06 DIAGNOSIS — S0181XA Laceration without foreign body of other part of head, initial encounter: Secondary | ICD-10-CM | POA: Insufficient documentation

## 2018-04-06 DIAGNOSIS — Y9389 Activity, other specified: Secondary | ICD-10-CM | POA: Insufficient documentation

## 2018-04-06 DIAGNOSIS — Y998 Other external cause status: Secondary | ICD-10-CM | POA: Insufficient documentation

## 2018-04-06 DIAGNOSIS — R410 Disorientation, unspecified: Secondary | ICD-10-CM | POA: Insufficient documentation

## 2018-04-06 DIAGNOSIS — J449 Chronic obstructive pulmonary disease, unspecified: Secondary | ICD-10-CM | POA: Insufficient documentation

## 2018-04-06 DIAGNOSIS — W19XXXA Unspecified fall, initial encounter: Secondary | ICD-10-CM | POA: Insufficient documentation

## 2018-04-06 MED ORDER — TETANUS-DIPHTH-ACELL PERTUSSIS 5-2.5-18.5 LF-MCG/0.5 IM SUSP
0.5000 mL | Freq: Once | INTRAMUSCULAR | Status: AC
  Administered 2018-04-06: 0.5 mL via INTRAMUSCULAR
  Filled 2018-04-06: qty 0.5

## 2018-04-06 MED ORDER — LIDOCAINE-EPINEPHRINE (PF) 2 %-1:200000 IJ SOLN
10.0000 mL | Freq: Once | INTRAMUSCULAR | Status: AC
  Administered 2018-04-06: 10 mL
  Filled 2018-04-06: qty 20

## 2018-04-06 NOTE — ED Notes (Signed)
Patient transported to CT 

## 2018-04-06 NOTE — ED Triage Notes (Addendum)
Pt arrived by [employer] from sidewalk. Driver passing by witness pt fall on sidewalk and called EMS. Pt mental status altered; possible ETOH use. Pt oriented to person; refuses to answer questions regarding situation. When asked to squeeze hand, pt squeezed RN's hand. Per EMS, pt thinks he is in [place]. Pt sustained injury to left eyebrow and lip lac due to fall per EMS. C-collar applied by EMS. VSS at this time.  EMS vital signs prior to arrival:  BP 109/75 95% O2 on room air 144 blood glucose 94 HR

## 2018-04-06 NOTE — ED Notes (Signed)
Culture sent down with UA °
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
## 2018-04-07 LAB — CBC WITH DIFFERENTIAL/PLATELET
Abs Immature Granulocytes: 0.01 10*3/uL (ref 0.00–0.07)
Basophils Absolute: 0 10*3/uL (ref 0.0–0.1)
Basophils Relative: 0 %
EOS ABS: 0 10*3/uL (ref 0.0–0.5)
Eosinophils Relative: 0 %
HCT: 39.6 % (ref 39.0–52.0)
Hemoglobin: 13.3 g/dL (ref 13.0–17.0)
IMMATURE GRANULOCYTES: 0 %
Lymphocytes Relative: 58 %
Lymphs Abs: 2.9 10*3/uL (ref 0.7–4.0)
MCH: 36.1 pg — ABNORMAL HIGH (ref 26.0–34.0)
MCHC: 33.6 g/dL (ref 30.0–36.0)
MCV: 107.6 fL — ABNORMAL HIGH (ref 80.0–100.0)
Monocytes Absolute: 0.3 10*3/uL (ref 0.1–1.0)
Monocytes Relative: 7 %
Neutro Abs: 1.7 10*3/uL (ref 1.7–7.7)
Neutrophils Relative %: 35 %
Platelets: 241 10*3/uL (ref 150–400)
RBC: 3.68 MIL/uL — ABNORMAL LOW (ref 4.22–5.81)
RDW: 13.7 % (ref 11.5–15.5)
WBC: 5 10*3/uL (ref 4.0–10.5)
nRBC: 0 % (ref 0.0–0.2)

## 2018-04-07 LAB — URINALYSIS, ROUTINE W REFLEX MICROSCOPIC
Bilirubin Urine: NEGATIVE
Glucose, UA: NEGATIVE mg/dL
Hgb urine dipstick: NEGATIVE
Ketones, ur: NEGATIVE mg/dL
Leukocytes, UA: NEGATIVE
Nitrite: NEGATIVE
Protein, ur: NEGATIVE mg/dL
Specific Gravity, Urine: 1.009 (ref 1.005–1.030)
pH: 6 (ref 5.0–8.0)

## 2018-04-07 LAB — BASIC METABOLIC PANEL
Anion gap: 7 (ref 5–15)
BUN: 10 mg/dL (ref 6–20)
CO2: 28 mmol/L (ref 22–32)
Calcium: 8.6 mg/dL — ABNORMAL LOW (ref 8.9–10.3)
Chloride: 107 mmol/L (ref 98–111)
Creatinine, Ser: 0.97 mg/dL (ref 0.61–1.24)
GFR calc Af Amer: >60 mL/min (ref >60)
GFR calc non Af Amer: >60 mL/min (ref >60)
Glucose, Bld: 94 mg/dL (ref 70–99)
Potassium: 5.2 mmol/L — ABNORMAL HIGH (ref 3.5–5.1)
Sodium: 142 mmol/L (ref 135–145)

## 2018-04-07 LAB — I-STAT TROPONIN, ED: Troponin i, poc: 0 ng/mL (ref 0.00–0.08)

## 2018-04-07 LAB — ETHANOL: Alcohol, Ethyl (B): 317 mg/dL (ref <10)

## 2018-04-07 NOTE — ED Notes (Signed)
Critical lab: blood alcohol 317

## 2018-04-07 NOTE — ED Provider Notes (Signed)
Patient signed out to me because he was too intoxicated to be discharged.  He had a fall earlier tonight, work-up was negative, wounds were sutured.  Patient has been monitored through the night and has done well.  He is now awake and alert, appropriate for discharge.   Orpah Greek, MD 04/07/18 [phone]

## 2018-05-21 ENCOUNTER — Other Ambulatory Visit: Payer: Self-pay

## 2018-05-21 ENCOUNTER — Ambulatory Visit: Payer: Self-pay

## 2018-06-04 ENCOUNTER — Other Ambulatory Visit: Payer: Self-pay

## 2018-06-04 ENCOUNTER — Ambulatory Visit: Payer: Self-pay

## 2018-06-04 ENCOUNTER — Ambulatory Visit (INDEPENDENT_AMBULATORY_CARE_PROVIDER_SITE_OTHER): Payer: Self-pay | Admitting: Family

## 2018-06-04 ENCOUNTER — Ambulatory Visit (INDEPENDENT_AMBULATORY_CARE_PROVIDER_SITE_OTHER): Payer: Self-pay | Admitting: Licensed Clinical Social Worker

## 2018-06-04 ENCOUNTER — Encounter: Payer: Self-pay | Admitting: Family

## 2018-06-04 VITALS — BP 114/75 | HR 87 | Temp 98.3°F | Ht 74.0 in | Wt 141.0 lb

## 2018-06-04 DIAGNOSIS — B20 Human immunodeficiency virus [HIV] disease: Secondary | ICD-10-CM

## 2018-06-04 DIAGNOSIS — Z113 Encounter for screening for infections with a predominantly sexual mode of transmission: Secondary | ICD-10-CM

## 2018-06-04 DIAGNOSIS — F331 Major depressive disorder, recurrent, moderate: Secondary | ICD-10-CM

## 2018-06-04 MED ORDER — ABACAVIR-DOLUTEGRAVIR-LAMIVUD 600-50-300 MG PO TABS: 1.0000 | ORAL_TABLET | Freq: Every day | ORAL | 2 refills | Status: DC

## 2018-06-04 NOTE — Patient Instructions (Signed)
Nice to meet you.   We will get you restarted on [facility].  Plan to follow up in 1 month or sooner to ensure we get you back on track.  Let us know if you have any questions.  Have a great day!

## 2018-06-04 NOTE — Progress Notes (Signed)
Subjective:    Patient ID: Arthur Lynch, male    DOB: 1960-11-12, 58 y.o.   MRN: [id number]  Chief Complaint  Patient presents with  . HIV Positive/AIDS     HPI:  Arthur Lynch is a 58 y.o. male who presents today for follow up of HIV disease.  Arthur Lynch was last seen in the office on 04/04/17 by Dr. Tommy Medal with good tolerance and less than optimal adherence to his regimen of Triumeq. He had been off medication for about 2 months before restarting 1 month prior to his visit due to depression and polysubstance abuse. He has not completed any recent blood work and here today to re-engage in care.   Arthur Lynch recently restarted taking his Triumeq about 10 days ago from medications that he found. Prior to that he had not been on medication for several months as had life stressors including depression that he was working through. He notes that after turning 32 in September he has been working to get his life back in order.  Arthur Lynch has been tolerating the Triumeq with no adverse side effects. He is feeling good today. Denies fevers, chills, night sweats, headaches, changes in vision, neck pain/stiffness, nausea, diarrhea, vomiting, lesions or rashes.  Arthur Lynch does not currently have insurance and was previously covered through [place] which he was able to re-apply for today. He would like to meet with our counselor today and get a new prescription for his medications. He is not currently sexually active and denies recreational or illicit drug use.   Allergies  Allergen Reactions  . Penicillins Itching    Has patient had a PCN reaction causing immediate rash, facial/tongue/throat swelling, SOB or lightheadedness with hypotension: No Has patient had a PCN reaction causing severe rash involving mucus membranes or skin necrosis: No Has patient had a PCN reaction that required hospitalization No Has patient had a PCN reaction occurring within the last 10 years: No If all of the above  answers are "NO", then may proceed with Cephalosporin use.       Outpatient Medications Prior to Visit  Medication Sig Dispense Refill  . amitriptyline (ELAVIL) 50 MG tablet TAKE 1 TABLET BY MOUTH AT BEDTIME (Patient not taking: Reported on 06/04/2018) 30 tablet 5  . abacavir-dolutegravir-lamiVUDine (TRIUMEQ) 600-50-300 MG tablet Take 1 tablet by mouth daily. (Patient not taking: Reported on 06/04/2018) 30 tablet 11  . doxycycline (VIBRAMYCIN) 100 MG capsule Take 1 capsule (100 mg total) by mouth 2 (two) times daily. (Patient not taking: Reported on 01/09/2017) 28 capsule 0  . ENSURE (ENSURE) Take 237 mLs by mouth 2 (two) times daily between meals. 308 mL 11  . folic acid (FOLVITE) 1 MG tablet Take 1 tablet (1 mg total) by mouth daily. (Patient not taking: Reported on 01/09/2017)    . Investigational - Study Medication Take 1 capsule by mouth daily. Study name:  Additional study details: per pt HIV Study Medication    . Multiple Vitamin (MULTIVITAMIN WITH MINERALS) TABS tablet Take 1 tablet by mouth at bedtime. (Patient not taking: Reported on 01/09/2017)    . thiamine 100 MG tablet Take 1 tablet (100 mg total) by mouth daily. (Patient not taking: Reported on 01/09/2017)     No facility-administered medications prior to visit.      Past Medical History:  Diagnosis Date  . Abnormal Pap smear   . Alcoholism in remission ([place]) 08/16/2015  . Anemia   . Atypical chest pain 10/06/2015  .  Cancer ([place])   . COPD (chronic obstructive pulmonary disease) ([place])   . Depression   . Eczema 10/06/2015  . HIV (human immunodeficiency virus infection) ([place])   . Murmur, heart   . Neuromuscular disorder ([place])   . Substance abuse [facility])      Past Surgical History:  Procedure Laterality Date  . ANOSCOPY         Review of Systems  Constitutional: Negative for appetite change, chills, fatigue, fever and unexpected weight change.  Eyes: Negative for visual disturbance.  Respiratory: Negative for  cough, chest tightness, shortness of breath and wheezing.   Cardiovascular: Negative for chest pain and leg swelling.  Gastrointestinal: Negative for abdominal pain, constipation, diarrhea, nausea and vomiting.  Genitourinary: Negative for dysuria, flank pain, frequency, genital sores, hematuria and urgency.  Skin: Negative for rash.  Allergic/Immunologic: Negative for immunocompromised state.  Neurological: Negative for dizziness and headaches.      Objective:    BP 114/75   Pulse 87   Temp 98.3 F (36.8 C) (Oral)   Ht 6\' 2"  (1.88 m)   Wt 141 lb (64 kg)   BMI 18.10 kg/m  Nursing note and vital signs reviewed.  Physical Exam Constitutional:      General: He is not in acute distress.    Appearance: He is well-developed.  Eyes:     Conjunctiva/sclera: Conjunctivae normal.  Neck:     Musculoskeletal: Neck supple.  Cardiovascular:     Rate and Rhythm: Normal rate and regular rhythm.     Heart sounds: Normal heart sounds. No murmur. No friction rub. No gallop.   Pulmonary:     Effort: Pulmonary effort is normal. No respiratory distress.     Breath sounds: Normal breath sounds. No wheezing or rales.  Chest:     Chest wall: No tenderness.  Abdominal:     General: Bowel sounds are normal.     Palpations: Abdomen is soft.     Tenderness: There is no abdominal tenderness.  Lymphadenopathy:     Cervical: No cervical adenopathy.  Skin:    General: Skin is warm and dry.     Findings: No rash.  Neurological:     Mental Status: He is alert and oriented to person, place, and time.  Psychiatric:        Behavior: Behavior normal.        Thought Content: Thought content normal.        Judgment: Judgment normal.        Assessment & Plan:   Problem List Items Addressed This Visit      Other   HIV disease ([place]) - Primary   Relevant Medications   abacavir-dolutegravir-lamiVUDine (TRIUMEQ) 600-50-300 MG tablet   Other Relevant Orders   HIV-1 RNA ultraquant reflex to gentyp+    T-helper cell (CD4)- ([facility] clinic only)   CBC (Completed)   COMPLETE METABOLIC PANEL WITH GFR (Completed)    Other Visit Diagnoses    Screening for STDs (sexually transmitted diseases)       Relevant Orders   RPR (Completed)   Cytology (oral, anal, urethral) ancillary only   Cytology (oral, anal, urethral) ancillary only   Urine cytology ancillary only([place])       I have discontinued Daleen Bo Mignogna's Investigational - Study Medication, multivitamin with minerals, thiamine, folic acid, doxycycline, and Ensure. I am also having him maintain his amitriptyline and abacavir-dolutegravir-lamiVUDine.   Meds ordered this encounter  Medications  . abacavir-dolutegravir-lamiVUDine (TRIUMEQ) 600-50-300 MG tablet  Sig: Take 1 tablet by mouth daily.    Dispense:  30 tablet    Refill:  2    Order Specific Question:   Supervising Provider    Answer:   Carlyle Basques [[id number]]     Follow-up: Return in about 1 month (around 07/05/2018), or if symptoms worsen or fail to improve.   Terri Piedra, MSN, FNP-C Nurse Practitioner [facility] for Infectious Disease [facility] Office phone: [phone] Pager: [place] number: [phone]

## 2018-06-05 ENCOUNTER — Encounter: Payer: Self-pay | Admitting: Family

## 2018-06-05 ENCOUNTER — Telehealth: Payer: Self-pay | Admitting: Pharmacy Technician

## 2018-06-05 NOTE — BH Specialist Note (Signed)
Integrated Behavioral Health Initial Visit  MRN: [id number] Name: Arthur Lynch  Number of [place] Clinician visits:: 1/6 Session Start time: 4:26pm Session End time: 458pm Total time: 30 minutes  Type of Service: [place] Interpretor:No. Interpretor Name and Language: n/a   Warm Hand Off Completed.       SUBJECTIVE: Arthur Lynch is a 58 y.o. male accompanied by self Patient was referred by Terri Piedra for depressive symptoms Patient reports the following symptoms/concerns: irritability, desire to be alone, very low self-concept, feelings of guilt, depressed mood, changes in sleep patterns, crying spells Duration of problem: 2-3 months; Severity of problem: moderate  OBJECTIVE: Mood: Depressed and Affect: Constricted Risk of harm to self or others: No plan to harm self or others  LIFE CONTEXT: Patient lives alone and works for [employer] from 4am to 1pm most days. He states that he enjoys his job, and is happy living alone. Patient states he has little support, because he has always been the one supporting everyone else.  GOALS ADDRESSED: Patient will: 1. Reduce symptoms of: depression   INTERVENTIONS: Interventions utilized: Motivational Interviewing and Supportive Counseling    ASSESSMENT: Patient currently experiencing irritability, desire to be alone, very low self-concept, feelings of guilt, depressed mood, changes in sleep patterns, crying spells. The most appropriate diagnosis for his symptoms at this time is Major Depressive Disorder, Recurrent, Moderate Severity.  Patient reports that he has been living his life for everyone else, and is now realizing how unhealthy that has been. He shared about being raised by his grandmother and always being expected to be a "little adult" and how this pattern continued into adulthood where he took care of his mother on her deathbead and is the [place] person for his  older brothers. Counselor explored with patient how he has dealt with these pressures. Patient indicated that he never saw them as pressures until a couple of year ago, because they were normal to him. He stated that looking back he can see that he coped with them through drinking and unhealthy relationships. Counselor guided patient to identify their goals for counseling. Patient wants to figure out who he is, and what is/is not healthy for him moving forward in life. Patient and counselor discussed ways that counseling might be beneficial in meeting these goals. Counselor emphasized the importance of patient being open and exploring his own beliefs, as well as his interactions with others. Patient reports that he is very happy at his job, and this provides positive interactions, but he is willing to consider patters of interactions as well, given past relationship issues.    Patient may benefit from ongoing weekly CBT sessions.  PLAN: 1. Patient will follow up with counselor on 06/10/18  [name], LCSW

## 2018-06-05 NOTE — Telephone Encounter (Signed)
[facility] Patient Advocate Encounter  Completed and sent [employer] application for Triumeq for this patient who is uninsured.    Patient is approved for 30 days to give time for ADAP approval.  Patient has pharmacy card information.  If he needs more than 30 days, we can submit the full application and we have his pay stubs.   Venida Jarvis. Nadara Mustard [place] Patient [facility] for Infectious Disease Phone: [phone] Fax:  [phone]

## 2018-06-05 NOTE — Assessment & Plan Note (Signed)
[name] has poorly controlled HIV disease due to less than optimal adherence to his medication regimen and has been in and out of care over the last couple of years due to depression and alcoholism. We discussed the importance of taking his medication and staying in care to optimize his health. We discussed a variety of situations and solutions. Will check his blood work today and include genotype to ensure he has not developed resistance to Triumeq or any components of Triumeq. He was able to meet with Arthur Lynch today to get re-established for counseling. Pharmacy staff met with him for medication assistance pending UMAP approval. Fortunately he has no signs/symptoms of opportunistic infection at present. Will continue Triumeq for now and plan for office visit and recheck in 1 month.

## 2018-06-06 LAB — CYTOLOGY, (ORAL, ANAL, URETHRAL) ANCILLARY ONLY
Chlamydia: NEGATIVE
Chlamydia: NEGATIVE
Neisseria Gonorrhea: NEGATIVE
Neisseria Gonorrhea: NEGATIVE

## 2018-06-06 LAB — T-HELPER CELL (CD4) - (RCID CLINIC ONLY)
CD4 % Helper T Cell: 11 % — ABNORMAL LOW (ref 33–55)
CD4 T Cell Abs: 170 /uL — ABNORMAL LOW (ref 400–2700)

## 2018-06-06 LAB — URINE CYTOLOGY ANCILLARY ONLY
CHLAMYDIA, DNA PROBE: NEGATIVE
Neisseria Gonorrhea: NEGATIVE

## 2018-06-10 ENCOUNTER — Other Ambulatory Visit: Payer: Self-pay

## 2018-06-10 ENCOUNTER — Telehealth (INDEPENDENT_AMBULATORY_CARE_PROVIDER_SITE_OTHER): Payer: Self-pay | Admitting: Licensed Clinical Social Worker

## 2018-06-10 DIAGNOSIS — F331 Major depressive disorder, recurrent, moderate: Secondary | ICD-10-CM

## 2018-06-11 NOTE — Progress Notes (Signed)
Integrated Behavioral Health Visit via Telemedicine (Telephone)  06/11/2018 Arthur Lynch [id number]   Session Start time: 2:00pm  Session End time: 2:30pm Total time: 30 minutes  Type of Visit: Telephonic Patient location: patient's home [facility] Provider location: [facility] All persons participating in visit: patient and counselor  Confirmed patient's address: Yes  Confirmed patient's phone number: Yes  Any changes to demographics: No   Confirmed patient's insurance: Yes  Any changes to patient's insurance: No   Discussed confidentiality: Yes    The following statements were read to the patient and/or legal guardian that are established with the [facility] Provider.  "The purpose of this phone visit is to provide behavioral health care while limiting exposure to the coronavirus (COVID19). "  "By engaging in this telephone visit, you consent to the provision of healthcare.  Additionally, you authorize for your insurance to be billed for the services provided during this telephone visit."   Patient and/or legal guardian consented to telephone visit: Yes   PRESENTING CONCERNS: Patient and/or family reports the following symptoms/concerns: irritability, depressed mood, crying spells, difficulty sleeping, guilty feelings  STRENGTHS (Protective Factors/Coping Skills): Motivated for growth, working toward independence, good job, spirituality  GOALS ADDRESSED: Patient will: 1.  Reduce symptoms of: depression   INTERVENTIONS: Interventions utilized:  Brief CBT and Supportive Counseling  ASSESSMENT: Patient currently experiencing depressed mood, irritability, feelings of guilt, crying spells and insomnia. He reports that he recently fell and though he was not seriously injured still has resultant pain and soreness. Patient describes it as severe, and it is coming in the way of him working regular hours. He states that today is the first day he has gone to work since the fall and  was only able to stay for 2 hours due to the pain.   Counselor guided patient to process his recent depressed mood. Patient indicates that he is struggling with the living situation, as there are "lots of little things" making him uncomfortable staying with his cousin. Counselor and patient explored how these things are unhealthy for him. Counselor educated patient on the relationships between thoughts, feelings, and behaviors. With coaching, patient was able to identify that while the actual differences between how cousin lives and how he wants to live aren't inherently unhealthy to him (e.g. he eats different foods and doesn't like pets while cousin has 2 in the house) his thoughts and the meaning he gives to these things do contribute to his depressed mood. Counselor and patient explored ways that patient can change thought patterns, not to agree with cousin's way of living, but to lead him toward hope and motivation rather than sadness and guilt. Counselor commended patient for identifying his needs and for moving toward independence (he has an interview with someone about income-based housing). Counselor encouraged patient to explore what would make a new living environment healthy for him, and what things outside of the actual living space he would need to work on. Patient identified that he needs to be around more people, and although he has been getting this from work he needs to branch out and find further social support.    Patient may benefit from CBT sessions ever other week (by phone during [place] distancing)  PLAN: 1. Follow up with behavioral health clinician on : 06/17/18 @ 2pm, via phone  [name]

## 2018-06-17 ENCOUNTER — Ambulatory Visit (INDEPENDENT_AMBULATORY_CARE_PROVIDER_SITE_OTHER): Payer: Self-pay | Admitting: Licensed Clinical Social Worker

## 2018-06-17 ENCOUNTER — Other Ambulatory Visit: Payer: Self-pay

## 2018-06-17 DIAGNOSIS — F331 Major depressive disorder, recurrent, moderate: Secondary | ICD-10-CM

## 2018-06-24 ENCOUNTER — Ambulatory Visit: Payer: Self-pay | Admitting: Licensed Clinical Social Worker

## 2018-06-24 LAB — CBC
HCT: 34.7 % — ABNORMAL LOW (ref 38.5–50.0)
Hemoglobin: 11.6 g/dL — ABNORMAL LOW (ref 13.2–17.1)
MCH: 34.6 pg — ABNORMAL HIGH (ref 27.0–33.0)
MCHC: 33.4 g/dL (ref 32.0–36.0)
MCV: 103.6 fL — ABNORMAL HIGH (ref 80.0–100.0)
MPV: 10.4 fL (ref 7.5–12.5)
Platelets: 228 10*3/uL (ref 140–400)
RBC: 3.35 10*6/uL — ABNORMAL LOW (ref 4.20–5.80)
RDW: 11.6 % (ref 11.0–15.0)
WBC: 4.1 10*3/uL (ref 3.8–10.8)

## 2018-06-24 LAB — COMPLETE METABOLIC PANEL WITH GFR
AG Ratio: 1 (calc) (ref 1.0–2.5)
ALBUMIN MSPROF: 3.7 g/dL (ref 3.6–5.1)
ALT: 31 U/L (ref 9–46)
AST: 54 U/L — ABNORMAL HIGH (ref 10–35)
Alkaline phosphatase (APISO): 76 U/L (ref 35–144)
BUN: 12 mg/dL (ref 7–25)
CO2: 29 mmol/L (ref 20–32)
Calcium: 9.3 mg/dL (ref 8.6–10.3)
Chloride: 105 mmol/L (ref 98–110)
Creat: 1.05 mg/dL (ref 0.70–1.33)
GFR, Est African American: 92 mL/min/{1.73_m2} (ref >=60)
GFR, Est Non African American: 80 mL/min/{1.73_m2} (ref >=60)
Globulin: 3.6 g/dL (calc) (ref 1.9–3.7)
Glucose, Bld: 96 mg/dL (ref 65–99)
Potassium: 4.6 mmol/L (ref 3.5–5.3)
SODIUM: 138 mmol/L (ref 135–146)
Total Bilirubin: 0.4 mg/dL (ref 0.2–1.2)
Total Protein: 7.3 g/dL (ref 6.1–8.1)

## 2018-06-24 LAB — RPR: RPR Ser Ql: NONREACTIVE

## 2018-06-24 LAB — HIV-1 GENOTYPE: HIV-1 Genotype: DETECTED — AB

## 2018-06-24 LAB — HIV-1 RNA ULTRAQUANT REFLEX TO GENTYP+
HIV 1 RNA Quant: 2960 copies/mL — ABNORMAL HIGH
HIV-1 RNA Quant, Log: 3.47 Log copies/mL — ABNORMAL HIGH

## 2018-07-01 ENCOUNTER — Telehealth: Payer: Self-pay | Admitting: Family

## 2018-07-01 NOTE — BH Specialist Note (Signed)
Integrated Behavioral Health Visit via Telemedicine (Telephone)    Arthur Lynch [id number]   Session Start time: 2:00pm  Session End time: 2:30pm Total time: 30 minutes  Type of Visit: Telephonic Patient location: Patient's home [facility] Provider location: [facility] All persons participating in visit: Counselor and patient   Confirmed patient's address: Yes  Confirmed patient's phone number: Yes  Any changes to demographics: No   Confirmed patient's insurance: Yes  Any changes to patient's insurance: No   Discussed confidentiality: Yes    The following statements were read to the patient and/or legal guardian that are established with the [facility] Provider.  "The purpose of this phone visit is to provide behavioral health care while limiting exposure to the coronavirus (COVID19).  There is a possibility of technology failure and discussed alternative modes of communication if that failure occurs."  "By engaging in this telephone visit, you consent to the provision of healthcare.  Additionally, you authorize for your insurance to be billed for the services provided during this telephone visit."   Patient and/or legal guardian consented to telephone visit: Yes   PRESENTING CONCERNS: Patient and/or family reports the following symptoms/concerns: irritability, depressed mood, insomnia, feelings of guilt/unworthiness Severity of problem: moderate  STRENGTHS (Protective Factors/Coping Skills): Strong faith/spirituality, positive motivation, gainfully employed  GOALS ADDRESSED: Patient will: 1.  Reduce symptoms of: depression   INTERVENTIONS: Interventions utilized:  Brief CBT and Supportive Counseling  ASSESSMENT: Patient currently experiencing depressed mood,guilty feelings and thoughts of inferiority, difficulty sleeping, and irritability. Counselor guided patient to discuss the impetus for his thoughts and feelings. Patient identified that it is difficult being  assertive and taking care of his own needs after a lifetime of putting others first. Counselor and patient explored the thoughts that underlie his passivity, and patient was able to identify that he does not believe all the things he was taught about selfishness and self-care. Counselor and patient brainstormed new self-statements which align more with his current beliefs. Counselor emphasized that this can also encourage patient's self-concept and lead to depressed feelings of depression in that way.  Patient may benefit from ongoing sessions every other week.  PLAN: 1. Patient will schedule follow-up telehealth session based on work schedule.   Arthur Lynch

## 2018-07-01 NOTE — Telephone Encounter (Signed)
COVID-19 Pre-Screening Questions: ° °Do you currently have a fever (>100 °F), chills or unexplained body aches? No  ° °Are you currently experiencing new cough, shortness of breath, sore throat, runny nose? No  °•  °Have you recently travelled outside the state of [place] in the last 14 days?no  °•  °Have you been in contact with someone that is currently pending confirmation of Covid19 testing or has been confirmed to have the Covid19 virus?  No  °

## 2018-07-02 ENCOUNTER — Other Ambulatory Visit: Payer: Self-pay

## 2018-07-02 ENCOUNTER — Encounter: Payer: Self-pay | Admitting: Family

## 2018-07-02 ENCOUNTER — Ambulatory Visit (INDEPENDENT_AMBULATORY_CARE_PROVIDER_SITE_OTHER): Payer: Self-pay | Admitting: Family

## 2018-07-02 VITALS — BP 97/68 | HR 156 | Temp 98.3°F | Wt 136.0 lb

## 2018-07-02 DIAGNOSIS — B20 Human immunodeficiency virus [HIV] disease: Secondary | ICD-10-CM

## 2018-07-02 DIAGNOSIS — B0229 Other postherpetic nervous system involvement: Secondary | ICD-10-CM

## 2018-07-02 DIAGNOSIS — Z0000 Encounter for general adult medical examination without abnormal findings: Secondary | ICD-10-CM | POA: Insufficient documentation

## 2018-07-02 MED ORDER — ABACAVIR-DOLUTEGRAVIR-LAMIVUD 600-50-300 MG PO TABS: 1.0000 | ORAL_TABLET | Freq: Every day | ORAL | 2 refills | Status: DC

## 2018-07-02 MED ORDER — AMITRIPTYLINE HCL 50 MG PO TABS: 50.0000 mg | ORAL_TABLET | Freq: Every day | ORAL | 5 refills | Status: DC

## 2018-07-02 MED ORDER — SULFAMETHOXAZOLE-TRIMETHOPRIM 400-80 MG PO TABS: 1.0000 | ORAL_TABLET | Freq: Every day | ORAL | 1 refills | Status: DC

## 2018-07-02 NOTE — Assessment & Plan Note (Signed)
Continues to have post herpetic neuralgia at times and has previously used amitriptyline in the past with resolution of symptoms. Requesting refill today.

## 2018-07-02 NOTE — Assessment & Plan Note (Signed)
   All immunizations are currently up to date per recommendations.  Will refer to [place] for routine dental care.  Discussed importance of safe sexual practice to reduce risk of transmission/acquisition of STI.

## 2018-07-02 NOTE — Assessment & Plan Note (Signed)
Mr. Schroth appears to be doing well with his ART regimen of Triumeq with good adherence and tolerance.  No signs/symptoms of opportunistic infection or progressive HIV disease at present.  He has no problems obtaining his medications with him prescription being sent to [facility] through [place].  Check blood work today.  Continue current dose of Triumeq.  Add Bactrim for OI prophylaxis.  Plan for follow-up in 2 months or sooner if needed with lab work 1 to 2 weeks prior to appointment.

## 2018-07-02 NOTE — Patient Instructions (Signed)
Nice to see you.  We will check your blood work today.  Continue to take your Triumeq as prescribed daily.  Start Bactrim daily.  We will plan to see you back in 2 months or sooner if needed with lab work 1-2 weeks prior to appointment.

## 2018-07-02 NOTE — Progress Notes (Signed)
Subjective:    Patient ID: Arthur Lynch, male    DOB: [birth year], 58 y.o.   MRN: [id number]  Chief Complaint  Patient presents with  . HIV Positive/AIDS     HPI:  Arthur Lynch is a 58 y.o. male with HIV disease who was last seen in the office on 06/04/2018 with poorly controlled HIV disease due to less than optimal adherence to his medication regimen over the last couple years challenged by depression and alcoholism.  Genotype showed no evidence of resistance and was restarted on Triumeq.  He was also able to meet with our counselor and establish counseling sessions.  He has since been approved for financial assistance through [place].   Arthur Lynch has been taking his Triumeq as prescribed without adverse side effects or missed doses. Feeling good overall. Denies fevers, chills, night sweats, headaches, changes in vision, neck pain/stiffness, nausea, diarrhea, vomiting, lesions or rashes.  Arthur Lynch has coverage through [facility] through September 2020. Has been working with [name]. Currently working as a Clinical research associate at [employer] and continues to work with stable housing. Not currently sexually active. No recreational or illicit drug use at present. Abstaining from alcohol currently and has its challenges. Due dental exam and due for colon cancer screening.   Allergies  Allergen Reactions  . Penicillins Itching    Has patient had a PCN reaction causing immediate rash, facial/tongue/throat swelling, SOB or lightheadedness with hypotension: No Has patient had a PCN reaction causing severe rash involving mucus membranes or skin necrosis: No Has patient had a PCN reaction that required hospitalization No Has patient had a PCN reaction occurring within the last 10 years: No If all of the above answers are "NO", then may proceed with Cephalosporin use.       Outpatient Medications Prior to Visit  Medication Sig Dispense Refill  . abacavir-dolutegravir-lamiVUDine (TRIUMEQ) 600-50-300 MG tablet  Take 1 tablet by mouth daily. 30 tablet 2  . amitriptyline (ELAVIL) 50 MG tablet TAKE 1 TABLET BY MOUTH AT BEDTIME (Patient not taking: Reported on 06/04/2018) 30 tablet 5   No facility-administered medications prior to visit.      Past Medical History:  Diagnosis Date  . Abnormal Pap smear   . Alcoholism in remission ([place]) 08/16/2015  . Anemia   . Atypical chest pain 10/06/2015  . Cancer ([place])   . COPD (chronic obstructive pulmonary disease) ([place])   . Depression   . Eczema 10/06/2015  . HIV (human immunodeficiency virus infection) ([place])   . Murmur, heart   . Neuromuscular disorder ([place])   . Substance abuse [facility])      Past Surgical History:  Procedure Laterality Date  . ANOSCOPY      Review of Systems  Constitutional: Negative for appetite change, chills, fatigue, fever and unexpected weight change.  Eyes: Negative for visual disturbance.  Respiratory: Negative for cough, chest tightness, shortness of breath and wheezing.   Cardiovascular: Negative for chest pain and leg swelling.  Gastrointestinal: Negative for abdominal pain, constipation, diarrhea, nausea and vomiting.  Genitourinary: Negative for dysuria, flank pain, frequency, genital sores, hematuria and urgency.  Skin: Negative for rash.  Allergic/Immunologic: Negative for immunocompromised state.  Neurological: Negative for dizziness and headaches.      Objective:    BP 97/68   Pulse (!) 156   Temp 98.3 F (36.8 C)   Wt 136 lb (61.7 kg)   BMI 17.46 kg/m  Nursing note and vital signs reviewed.  Physical Exam Constitutional:  General: He is not in acute distress.    Appearance: He is well-developed.  Eyes:     Conjunctiva/sclera: Conjunctivae normal.  Neck:     Musculoskeletal: Neck supple.  Cardiovascular:     Rate and Rhythm: Normal rate and regular rhythm.     Heart sounds: Normal heart sounds. No murmur. No friction rub. No gallop.   Pulmonary:     Effort: Pulmonary effort is normal. No  respiratory distress.     Breath sounds: Normal breath sounds. No wheezing or rales.  Chest:     Chest wall: No tenderness.  Abdominal:     General: Bowel sounds are normal.     Palpations: Abdomen is soft.     Tenderness: There is no abdominal tenderness.  Lymphadenopathy:     Cervical: No cervical adenopathy.  Skin:    General: Skin is warm and dry.     Findings: No rash.  Neurological:     Mental Status: He is alert and oriented to person, place, and time.  Psychiatric:        Behavior: Behavior normal.        Thought Content: Thought content normal.        Judgment: Judgment normal.        Assessment & Plan:   Problem List Items Addressed This Visit      Nervous and Auditory   HZV (herpes zoster virus) post herpetic neuralgia    Continues to have post herpetic neuralgia at times and has previously used amitriptyline in the past with resolution of symptoms. Requesting refill today.        Other   HIV disease ([name]) - Primary    Arthur Lynch appears to be doing well with his ART regimen of Triumeq with good adherence and tolerance.  No signs/symptoms of opportunistic infection or progressive HIV disease at present.  He has no problems obtaining his medications with him prescription being sent to [facility] through [place].  Check blood work today.  Continue current dose of Triumeq.  Add Bactrim for OI prophylaxis.  Plan for follow-up in 2 months or sooner if needed with lab work 1 to 2 weeks prior to appointment.      Relevant Medications   abacavir-dolutegravir-lamiVUDine (TRIUMEQ) 600-50-300 MG tablet   sulfamethoxazole-trimethoprim (BACTRIM) 400-80 MG tablet   Other Relevant Orders   COMPLETE METABOLIC PANEL WITH GFR   HIV-1 RNA quant-no reflex-bld   T-helper cell (CD4)- ([facility] clinic only)   HIV-1 RNA quant-no reflex-bld   Comprehensive metabolic panel   T-helper cell (CD4)- ([facility] clinic only)   Healthcare maintenance     All immunizations are currently up to date per  recommendations.  Will refer to [facility] for routine dental care.  Discussed importance of safe sexual practice to reduce risk of transmission/acquisition of STI.           I have changed [name] E. Tully's amitriptyline. I am also having him start on sulfamethoxazole-trimethoprim. Additionally, I am having him maintain his abacavir-dolutegravir-lamiVUDine.   Meds ordered this encounter  Medications  . amitriptyline (ELAVIL) 50 MG tablet    Sig: Take 1 tablet (50 mg total) by mouth at bedtime.    Dispense:  30 tablet    Refill:  5    Order Specific Question:   Supervising Provider    Answer:   Carlyle Basques [[id number]]  . abacavir-dolutegravir-lamiVUDine (TRIUMEQ) 600-50-300 MG tablet    Sig: Take 1 tablet by mouth daily.    Dispense:  30  tablet    Refill:  2    Order Specific Question:   Supervising Provider    Answer:   Carlyle Basques [[id number]]  . sulfamethoxazole-trimethoprim (BACTRIM) 400-80 MG tablet    Sig: Take 1 tablet by mouth daily.    Dispense:  30 tablet    Refill:  1    Order Specific Question:   Supervising Provider    Answer:   Carlyle Basques [[id number]]     Follow-up: Return in about 2 months (around 09/01/2018), or if symptoms worsen or fail to improve.   Terri Piedra, MSN, FNP-C Nurse Practitioner [facility] [place] number: [phone]

## 2018-07-03 LAB — T-HELPER CELL (CD4) - (RCID CLINIC ONLY)
CD4 % Helper T Cell: 28 % — ABNORMAL LOW (ref 33–55)
CD4 T Cell Abs: 350 /uL — ABNORMAL LOW (ref 400–2700)

## 2018-07-04 ENCOUNTER — Telehealth: Payer: Self-pay | Admitting: Behavioral Health

## 2018-07-04 LAB — COMPLETE METABOLIC PANEL WITH GFR
AG Ratio: 1.1 (calc) (ref 1.0–2.5)
ALT: 13 U/L (ref 9–46)
AST: 38 U/L — ABNORMAL HIGH (ref 10–35)
Albumin: 4.1 g/dL (ref 3.6–5.1)
Alkaline phosphatase (APISO): 65 U/L (ref 35–144)
BUN: 14 mg/dL (ref 7–25)
CO2: 31 mmol/L (ref 20–32)
Calcium: 9.7 mg/dL (ref 8.6–10.3)
Chloride: 104 mmol/L (ref 98–110)
Creat: 1.17 mg/dL (ref 0.70–1.33)
GFR, Est African American: 81 mL/min/{1.73_m2} (ref >=60)
GFR, Est Non African American: 70 mL/min/{1.73_m2} (ref >=60)
Globulin: 3.6 g/dL (calc) (ref 1.9–3.7)
Glucose, Bld: 127 mg/dL — ABNORMAL HIGH (ref 65–99)
Potassium: 4.2 mmol/L (ref 3.5–5.3)
Sodium: 141 mmol/L (ref 135–146)
Total Bilirubin: 0.5 mg/dL (ref 0.2–1.2)
Total Protein: 7.7 g/dL (ref 6.1–8.1)

## 2018-07-04 LAB — HIV-1 RNA QUANT-NO REFLEX-BLD
HIV 1 RNA Quant: 82 copies/mL — ABNORMAL HIGH
HIV-1 RNA Quant, Log: 1.91 Log copies/mL — ABNORMAL HIGH

## 2018-07-04 NOTE — Telephone Encounter (Signed)
-----   Message from [place], [place] sent at 07/04/2018  2:05 PM EDT ----- Please inform Mr. Antonacci that is viral load is 82 and his CD4 count is 350. Please continue to take the Biktarvy daily and Bactrim as prescribed with continued follow up in July.

## 2018-07-04 NOTE — Telephone Encounter (Signed)
Called patient, left a message to call the doctors office back and left the call back number. Pricilla Riffle RN

## 2018-07-09 NOTE — Telephone Encounter (Signed)
Called patient, verified informed patient that per Terri Piedra NP that his viral load is 82 and his CD4 is 350.  Which are both improved.  Informed him to continue taking his Biktarvy and Bactrim and follow up with Marya Amsler in July as planned.  Patient verbalized understanding. Pricilla Riffle RN

## 2018-08-15 ENCOUNTER — Other Ambulatory Visit: Payer: Self-pay

## 2018-08-15 ENCOUNTER — Encounter (INDEPENDENT_AMBULATORY_CARE_PROVIDER_SITE_OTHER): Payer: Self-pay

## 2018-08-15 VITALS — BP 117/84 | HR 89 | Temp 98.7°F | Wt 134.5 lb

## 2018-08-15 DIAGNOSIS — Z006 Encounter for examination for normal comparison and control in clinical research program: Secondary | ICD-10-CM

## 2018-08-15 NOTE — Research (Signed)
Participant here for Repreive study. He is getting back into care. He is taking his ARV and bactrim daily and will restart his study medication after he receives it in the mail. He met with Jessica Priest today. He was working with [name] but do to the changes in schedules with COVID he was only doing phone visits with her. He prefers in person interaction. He was scheduled with her for recurrent visits starting next week. He is scheduled for return study visit on 8/ 24.

## 2018-08-22 ENCOUNTER — Ambulatory Visit: Payer: Self-pay

## 2018-08-29 ENCOUNTER — Ambulatory Visit: Payer: Self-pay

## 2018-09-02 ENCOUNTER — Other Ambulatory Visit: Payer: Self-pay

## 2018-09-16 ENCOUNTER — Ambulatory Visit: Payer: Self-pay

## 2018-09-16 ENCOUNTER — Other Ambulatory Visit: Payer: Self-pay

## 2018-09-16 ENCOUNTER — Ambulatory Visit (INDEPENDENT_AMBULATORY_CARE_PROVIDER_SITE_OTHER): Payer: Self-pay | Admitting: Family

## 2018-09-16 ENCOUNTER — Encounter: Payer: Self-pay | Admitting: Family

## 2018-09-16 VITALS — BP 136/84 | HR 111 | Temp 98.7°F | Wt 131.0 lb

## 2018-09-16 DIAGNOSIS — A084 Viral intestinal infection, unspecified: Secondary | ICD-10-CM | POA: Insufficient documentation

## 2018-09-16 DIAGNOSIS — B20 Human immunodeficiency virus [HIV] disease: Secondary | ICD-10-CM

## 2018-09-16 DIAGNOSIS — Z0000 Encounter for general adult medical examination without abnormal findings: Secondary | ICD-10-CM

## 2018-09-16 MED ORDER — TRIUMEQ 600-50-300 MG PO TABS: 1.0000 | ORAL_TABLET | Freq: Every day | ORAL | 2 refills | Status: DC

## 2018-09-16 NOTE — Progress Notes (Signed)
Subjective:    Patient ID: Arthur Lynch, male    DOB: [birth year], 58 y.o.   MRN: [id number]  Chief Complaint  Patient presents with  . HIV Positive/AIDS  . Diarrhea     HPI:  Arthur Lynch is a 58 y.o. male with HIV disease who was last seen in the office on 07/02/2018 with good adherence and tolerance to his ART regimen of Triumeq.  Viral load at the time improved to 82 from 2960 with CD4 count improvement to 350 from 170.   Arthur Lynch continues to take his Triumeq as prescribed no adverse side effects or missed doses.  He is feeling better today, however for the past week he has been having vomiting and diarrhea which he relates to probable foodborne illness or possible virus he obtained at work.  He has not had any symptoms in the last 48 hours and is slowly progressing his diet as tolerated. Denies fevers, chills, night sweats, headaches, changes in vision, neck pain/stiffness, nausea, diarrhea, vomiting, lesions or rashes.  Arthur Lynch has no problems obtaining his medication from the pharmacy and remains up-to-date on the [facility] program.  He continues to work full-time.  No recreational or illicit drug use, tobacco use, or alcohol consumption.  Denies feelings of being down, depressed, or hopeless recently.  Not currently sexually active.  Hoping to go back to [place] for his birthday.   Allergies  Allergen Reactions  . Penicillins Itching    Has patient had a PCN reaction causing immediate rash, facial/tongue/throat swelling, SOB or lightheadedness with hypotension: No Has patient had a PCN reaction causing severe rash involving mucus membranes or skin necrosis: No Has patient had a PCN reaction that required hospitalization No Has patient had a PCN reaction occurring within the last 10 years: No If all of the above answers are "NO", then may proceed with Cephalosporin use.       Outpatient Medications Prior to Visit  Medication Sig Dispense Refill  . amitriptyline  (ELAVIL) 50 MG tablet Take 1 tablet (50 mg total) by mouth at bedtime. 30 tablet 5  . sulfamethoxazole-trimethoprim (BACTRIM) 400-80 MG tablet Take 1 tablet by mouth daily. 30 tablet 1  . abacavir-dolutegravir-lamiVUDine (TRIUMEQ) 600-50-300 MG tablet Take 1 tablet by mouth daily. 30 tablet 2   No facility-administered medications prior to visit.      Past Medical History:  Diagnosis Date  . Abnormal Pap smear   . Alcoholism in remission ([place]) 08/16/2015  . Anemia   . Atypical chest pain 10/06/2015  . Cancer ([place])   . COPD (chronic obstructive pulmonary disease) ([place])   . Depression   . Eczema 10/06/2015  . HIV (human immunodeficiency virus infection) ([place])   . Murmur, heart   . Neuromuscular disorder ([place])   . Substance abuse [facility])      Past Surgical History:  Procedure Laterality Date  . ANOSCOPY       Review of Systems  Constitutional: Negative for appetite change, chills, fatigue, fever and unexpected weight change.  Eyes: Negative for visual disturbance.  Respiratory: Negative for cough, chest tightness, shortness of breath and wheezing.   Cardiovascular: Negative for chest pain and leg swelling.  Gastrointestinal: Negative for abdominal pain, constipation, diarrhea, nausea and vomiting.  Genitourinary: Negative for dysuria, flank pain, frequency, genital sores, hematuria and urgency.  Skin: Negative for rash.  Allergic/Immunologic: Negative for immunocompromised state.  Neurological: Negative for dizziness and headaches.      Objective:    BP 136/84  Pulse (!) 111   Temp 98.7 F (37.1 C)   Wt 131 lb (59.4 kg)   BMI 16.82 kg/m  Nursing note and vital signs reviewed.  Physical Exam Constitutional:      General: He is not in acute distress.    Appearance: He is well-developed.  Eyes:     Conjunctiva/sclera: Conjunctivae normal.  Neck:     Musculoskeletal: Neck supple.  Cardiovascular:     Rate and Rhythm: Normal rate and regular rhythm.     Heart  sounds: Normal heart sounds. No murmur. No friction rub. No gallop.   Pulmonary:     Effort: Pulmonary effort is normal. No respiratory distress.     Breath sounds: Normal breath sounds. No wheezing or rales.  Chest:     Chest wall: No tenderness.  Abdominal:     General: Bowel sounds are normal.     Palpations: Abdomen is soft.     Tenderness: There is no abdominal tenderness.  Lymphadenopathy:     Cervical: No cervical adenopathy.  Skin:    General: Skin is warm and dry.     Findings: No rash.  Neurological:     Mental Status: He is alert and oriented to person, place, and time.  Psychiatric:        Behavior: Behavior normal.        Thought Content: Thought content normal.        Judgment: Judgment normal.        Assessment & Plan:   Problem List Items Addressed This Visit      Digestive   Viral gastroenteritis    Arthur Lynch has symptoms consistent with resolving gastroenteritis likely foodborne origin.  Continue conservative care with over-the-counter medications as needed for symptom relief and supportive care.  Advance diet as tolerated.  Follow-up if symptoms do not continually improve.      Relevant Medications   abacavir-dolutegravir-lamiVUDine (TRIUMEQ) 600-50-300 MG tablet     Other   HIV disease ([place]) - Primary    Arthur Lynch has well-controlled HIV disease with good adherence and tolerance to his ART regimen of Triumeq.  No signs/symptoms of opportunistic infection or progressive HIV disease at present. Will renew ADAP/UMAP today.  Recheck blood work.  Continue current dose of Triumeq.  Plan to stop Bactrim if CD4 count is above 200.  Plan for follow-up in 3 months or sooner if needed with lab work 1 to 2 weeks prior to appointment.      Relevant Medications   abacavir-dolutegravir-lamiVUDine (TRIUMEQ) 600-50-300 MG tablet   Other Relevant Orders   COMPLETE METABOLIC PANEL WITH GFR   CBC   HIV-1 RNA quant-no reflex-bld   T-helper cell (CD4)- ([facility] clinic only)    T-helper cell (CD4)- ([facility] clinic only)   HIV-1 RNA quant-no reflex-bld   CBC   Comprehensive metabolic panel   Healthcare maintenance     Declines immunizations today.  Discussed importance of safe sexual practice to reduce risk of acquisition/transmission of STI.  Declines condoms.          I am having Flonnie Hailstone maintain his amitriptyline, sulfamethoxazole-trimethoprim, and Triumeq.   Meds ordered this encounter  Medications  . abacavir-dolutegravir-lamiVUDine (TRIUMEQ) 600-50-300 MG tablet    Sig: Take 1 tablet by mouth daily.    Dispense:  30 tablet    Refill:  2    Order Specific Question:   Supervising Provider    Answer:   Carlyle Basques [[id number]]     Follow-up: Return  in about 3 months (around 12/17/2018), or if symptoms worsen or fail to improve.   Terri Piedra, MSN, FNP-C Nurse Practitioner [facility] [place] number: [phone]

## 2018-09-16 NOTE — Assessment & Plan Note (Addendum)
Mr. Zhong has well-controlled HIV disease with good adherence and tolerance to his ART regimen of Triumeq.  No signs/symptoms of opportunistic infection or progressive HIV disease at present. Will renew ADAP/UMAP today.  Recheck blood work.  Continue current dose of Triumeq.  Plan to stop Bactrim if CD4 count is above 200.  Plan for follow-up in 3 months or sooner if needed with lab work 1 to 2 weeks prior to appointment.

## 2018-09-16 NOTE — Patient Instructions (Signed)
Nice to see you.  We will check your blood work today.  Refills of your medication have been sent.  Continue to take Triumeq and Bactrim.  Follow up in 3 months or sooner if needed.

## 2018-09-16 NOTE — Assessment & Plan Note (Signed)
   Declines immunizations today.  Discussed importance of safe sexual practice to reduce risk of acquisition/transmission of STI.  Declines condoms.

## 2018-09-16 NOTE — Assessment & Plan Note (Signed)
Arthur Lynch has symptoms consistent with resolving gastroenteritis likely foodborne origin.  Continue conservative care with over-the-counter medications as needed for symptom relief and supportive care.  Advance diet as tolerated.  Follow-up if symptoms do not continually improve.

## 2018-09-17 LAB — T-HELPER CELL (CD4) - (RCID CLINIC ONLY)
CD4 % Helper T Cell: 35 % (ref 33–65)
CD4 T Cell Abs: 277 /uL — ABNORMAL LOW (ref 400–1790)

## 2018-09-21 LAB — COMPLETE METABOLIC PANEL WITH GFR
AG Ratio: 1 (calc) (ref 1.0–2.5)
ALT: 21 U/L (ref 9–46)
AST: 45 U/L — ABNORMAL HIGH (ref 10–35)
Albumin: 3.5 g/dL — ABNORMAL LOW (ref 3.6–5.1)
Alkaline phosphatase (APISO): 88 U/L (ref 35–144)
BUN: 8 mg/dL (ref 7–25)
CO2: 31 mmol/L (ref 20–32)
Calcium: 9.4 mg/dL (ref 8.6–10.3)
Chloride: 102 mmol/L (ref 98–110)
Creat: 0.93 mg/dL (ref 0.70–1.33)
GFR, Est African American: 107 mL/min/{1.73_m2} (ref >=60)
GFR, Est Non African American: 92 mL/min/{1.73_m2} (ref >=60)
Globulin: 3.4 g/dL (calc) (ref 1.9–3.7)
Glucose, Bld: 99 mg/dL (ref 65–99)
Potassium: 4.7 mmol/L (ref 3.5–5.3)
Sodium: 140 mmol/L (ref 135–146)
Total Bilirubin: 0.7 mg/dL (ref 0.2–1.2)
Total Protein: 6.9 g/dL (ref 6.1–8.1)

## 2018-09-21 LAB — CBC
HCT: 33.5 % — ABNORMAL LOW (ref 38.5–50.0)
Hemoglobin: 11.6 g/dL — ABNORMAL LOW (ref 13.2–17.1)
MCH: 35.9 pg — ABNORMAL HIGH (ref 27.0–33.0)
MCHC: 34.6 g/dL (ref 32.0–36.0)
MCV: 103.7 fL — ABNORMAL HIGH (ref 80.0–100.0)
MPV: 8.5 fL (ref 7.5–12.5)
Platelets: 348 10*3/uL (ref 140–400)
RBC: 3.23 10*6/uL — ABNORMAL LOW (ref 4.20–5.80)
RDW: 12 % (ref 11.0–15.0)
WBC: 4.5 10*3/uL (ref 3.8–10.8)

## 2018-09-21 LAB — HIV-1 RNA QUANT-NO REFLEX-BLD
HIV 1 RNA Quant: 248 copies/mL — ABNORMAL HIGH
HIV-1 RNA Quant, Log: 2.39 Log copies/mL — ABNORMAL HIGH

## 2018-11-13 ENCOUNTER — Encounter: Payer: Self-pay | Admitting: Infectious Disease

## 2019-03-18 ENCOUNTER — Encounter: Payer: Self-pay | Admitting: Family

## 2019-03-18 NOTE — Progress Notes (Signed)
Patient ID: Arthur Lynch, male   DOB: Apr 29, 1960, 59 y.o.   MRN: [id number] Working Viral Load List   Called patient  Message states call could not be completed

## 2019-05-06 ENCOUNTER — Ambulatory Visit: Payer: Self-pay | Attending: Internal Medicine

## 2019-05-06 ENCOUNTER — Ambulatory Visit: Payer: Self-pay

## 2019-05-06 DIAGNOSIS — Z20822 Contact with and (suspected) exposure to covid-19: Secondary | ICD-10-CM | POA: Insufficient documentation

## 2019-05-07 LAB — NOVEL CORONAVIRUS, NAA: SARS-CoV-2, NAA: NOT DETECTED

## 2019-06-30 ENCOUNTER — Ambulatory Visit (HOSPITAL_COMMUNITY)
Admission: EM | Admit: 2019-06-30 | Discharge: 2019-06-30 | Disposition: A | Discharge status: Home / Self Care | Payer: Self-pay | Attending: Physician Assistant | Admitting: Physician Assistant

## 2019-06-30 DIAGNOSIS — S0502XA Injury of conjunctiva and corneal abrasion without foreign body, left eye, initial encounter: Secondary | ICD-10-CM

## 2019-06-30 MED ORDER — CIPROFLOXACIN HCL 0.3 % OP SOLN: 1.0000 [drp] | OPHTHALMIC | 0 refills | Status: AC

## 2019-06-30 NOTE — ED Triage Notes (Signed)
Pt c/o L eye pain and swelling x 1 week with drainage upon waking.

## 2019-06-30 NOTE — ED Provider Notes (Signed)
[place]    CSN: [id number] Arrival date & time: 06/30/19  1008      History   Chief Complaint Chief Complaint  Patient presents with  . Eye Pain    HPI Arthur Lynch is a 59 y.o. male.   Patient presents for concern of left eye swelling, eye discharge and some eye pain for 1 week.  Reports being about a week ago he had some eye discomfort but describes as being primarily in the middle part of his eye feeling that there may be some air in his eye.  However most of his pain irritation now is around his eye.  He has had some discharge in the morning but clears throughout the day.  The eye has been itchy on occasion.  He has had some blurry vision in the left eye.  Light does not cause significant pain or blurry vision.  He denies knowing if he got anything in the eye and was not working with wood or metal.  Denies significant redness of the eye.  Reports he is a contact wearer but has not had contacts in since symptoms started.  Denies involvement of the right eye.     Past Medical History:  Diagnosis Date  . Abnormal Pap smear   . Alcoholism in remission ([place]) 08/16/2015  . Anemia   . Atypical chest pain 10/06/2015  . Cancer ([place])   . COPD (chronic obstructive pulmonary disease) ([place])   . Depression   . Eczema 10/06/2015  . HIV (human immunodeficiency virus infection) ([place])   . Murmur, heart   . Neuromuscular disorder ([place])   . Substance abuse [facility])     Patient Active Problem List   Diagnosis Date Noted  . Viral gastroenteritis 09/16/2018  . Healthcare maintenance 07/02/2018  . Infection of lip 07/28/2016  . Infected lip laceration 07/28/2016  . Alcohol dependence with acute alcoholic intoxication ([place]) 07/25/2016  . Major depressive disorder, recurrent severe without psychotic features ([place]) 07/25/2016  . MDD (major depressive disorder) 07/25/2016  . Atypical chest pain 10/06/2015  . Eczema 10/06/2015  . Alcoholism in remission ([place]) 08/16/2015  . HIV  disease ([place]) 06/04/2014  . HZV (herpes zoster virus) post herpetic neuralgia 06/04/2014  . Rash and nonspecific skin eruption 05/11/2014  . Rib pain on left side 10/15/2013  . Abnormal Pap smear   . ASCUS (atypical squamous cells of undetermined significance) on Pap smear 07/24/2012  . Insomnia 12/20/2011  . Anemia 06/21/2011  . Gynecomastia 05/31/2011  . Zoster ophthalmicus 05/31/2011  . Headache(784.0) 02/15/2011  . Neuropathic pain 11/02/2010  . Cyst on ear 11/02/2010  . ACUTE GASTRITIS WITHOUT MENTION OF HEMORRHAGE 12/29/2009  . CONSTIPATION 12/29/2009  . Alcohol dependence with alcohol-induced mood disorder ([place]) 06/07/2009  . HERPES ZOSTER 12/07/2008  . NEURALGIA, TRIGEMINAL 12/07/2008  . ANEMIA, MACROCYTIC, CHRONIC 11/23/2008  . POLYURIA 11/23/2008  . NOCTURIA 11/23/2008  . GYNECOMASTIA 09/21/2008  . CARBUNCLE AND FURUNCLE OF UNSPECIFIED SITE 06/19/2008  . Leukocytopenia 03/16/2008  . ROSACEA 07/22/2007  . EDEMA 07/22/2007  . PAROXYSMAL NOCTURNAL DYSPNEA 04/01/2007  . Pruritic disorder 01/24/2007  . DIARRHEA 01/24/2007  . Major depressive disorder with current active episode 12/10/2006  . Human immunodeficiency virus (HIV) disease ([place]) 01/13/2006    Past Surgical History:  Procedure Laterality Date  . ANOSCOPY         Home Medications    Prior to Admission medications   Medication Sig Start Date End Date Taking? Authorizing Provider  abacavir-dolutegravir-lamiVUDine [facility]) 600-50-300  MG tablet Take 1 tablet by mouth daily. 09/16/18   Golden Circle, FNP  amitriptyline (ELAVIL) 50 MG tablet Take 1 tablet (50 mg total) by mouth at bedtime. 07/02/18   Golden Circle, FNP  ciprofloxacin (CILOXAN) 0.3 % ophthalmic solution Place 1 drop into the left eye every 4 (four) hours while awake for 7 days. Administer 1 drop, every 2 hours, while awake, for 2 days. Then 1 drop, every 4 hours, while awake, for the next 5 days. 06/30/19 07/07/19  Janete Quilling, Marguerita Beards, PA-C     Family History No family history on file.  Social History Social History   Tobacco Use  . Smoking status: Never Smoker  . Smokeless tobacco: Never Used  . Tobacco comment: Given condoms  Substance Use Topics  . Alcohol use: Yes    Alcohol/week: 45.0 standard drinks    Types: 45 Standard drinks or equivalent per week    Comment: relapsed,   . Drug use: No     Allergies   Penicillins   Review of Systems Review of Systems   Physical Exam Triage Vital Signs ED Triage Vitals [06/30/19 1059]  Enc Vitals Group     BP 127/75     Pulse Rate 98     Resp 16     Temp 98.3 F (36.8 C)     Temp src      SpO2 98 %     Weight      Height      Head Circumference      Peak Flow      Pain Score 2     Pain Loc      Pain Edu?      Excl. in [place]?    No data found.  Updated Vital Signs BP 127/75   Pulse 98   Temp 98.3 F (36.8 C)   Resp 16   SpO2 98%   Visual Acuity Right Eye Distance:   Left Eye Distance:   Bilateral Distance:    Right Eye Near: R Near: 20/50 Left Eye Near:  L Near: 20/100 Bilateral Near:  20/50  Physical Exam Vitals and nursing note reviewed.  Constitutional:      General: He is not in acute distress.    Appearance: Normal appearance. He is not ill-appearing.  Eyes:     General:        Right eye: No foreign body.     Extraocular Movements: Extraocular movements intact.     Comments: Left eye: Fluorescein exam with some uptake just outside of iris at 7 o'clock position there is mostly irregularly bordered however there is possible linear uptake extending into margin of the iris 7 o'clock position.  Sclera is white without significant chemosis.  There is some swelling of the upper eyelid.  There is some discharge in the medial canthus.   Visual fields are intact to confrontation.   Cardiovascular:     Rate and Rhythm: Normal rate.  Pulmonary:     Effort: Pulmonary effort is normal. No respiratory distress.  Neurological:     Mental  Status: He is alert.      UC Treatments / Results  Labs (all labs ordered are listed, but only abnormal results are displayed) Labs Reviewed - No data to display  EKG   Radiology No results found.  Procedures Procedures (including critical care time)  Medications Ordered in UC Medications - No data to display  Initial Impression / Assessment and Plan / UC Course  I have reviewed the triage vital signs and the nursing notes.  Pertinent labs & imaging results that were available during my care of the patient were reviewed by me and considered in my medical decision making (see chart for details).     #Corneal abrasion Patient is 59 year old presenting with symptoms and exam concerning for corneal abrasion.  Given significant visual acuity change in fluorescein uptake will place on ciprofloxacin drops given contact usage and have follow-up with ophthalmology.  Low suspicion for globe injury at this time.  This may also be a bad case of conjunctivitis however given uptake and significant visual acuity change discussed that I would like for the patient be reevaluated by an ophthalmologist.  Patient verbalizes agreement with this and would prefer this. Final Clinical Impressions(s) / UC Diagnoses   Final diagnoses:  Abrasion of left cornea, initial encounter     Discharge Instructions     Use the eye drops every 4 hours while awake  Please follow up with the ophthalmology office to ensure healing and have evaluation    ED Prescriptions    Medication Sig Dispense Auth. Provider   ciprofloxacin (CILOXAN) 0.3 % ophthalmic solution Place 1 drop into the left eye every 4 (four) hours while awake for 7 days. Administer 1 drop, every 2 hours, while awake, for 2 days. Then 1 drop, every 4 hours, while awake, for the next 5 days. 2.5 mL Ajahni Nay, Marguerita Beards, PA-C     PDMP not reviewed this encounter.   Purnell Shoemaker, PA-C 06/30/19 2322

## 2019-06-30 NOTE — Discharge Instructions (Signed)
Use the eye drops every 4 hours while awake  Please follow up with the ophthalmology office to ensure healing and have evaluation

## 2019-07-02 ENCOUNTER — Telehealth (HOSPITAL_COMMUNITY): Payer: Self-pay

## 2019-07-02 NOTE — Telephone Encounter (Signed)
Called to confirm with pt that he did receive Rx for eye drops. Pt states he was able to get Rx at approx 1745 on 06/30/19 after the pharmacy had told him to call back several times to check if Rx was ready b/c the pharmacy did not have Rx prepared. Pt states he has been using the eye drops and eye abrasion and drainage is improving. Reports plans to follow up with opthalmology.  Pt voiced gratefulness for follow up call.

## 2019-07-03 ENCOUNTER — Telehealth: Payer: Self-pay | Admitting: Pharmacy Technician

## 2019-07-03 NOTE — Telephone Encounter (Addendum)
[facility] PATIENT ADVOCATE ENCOUNTER  Completed and sent Social research officer, government for Triumeq for this patient who is uninsured in 2020.  Patient was approved for 30 days to give time for ADAP approval.  This is the 2nd of 3 immediate fills within a lifetime for this patient  BIN: [id number] PCN: [id number] GRP: [id number] ID: [id number]   If he needs additional assistance beyond the last immediate fill, we must submit the full application and along with current pay stubs.  Romona Curls helped take the patient to the pharmacy with the processing information.  The information was also faxed to [facility].  Venida Jarvis. Nadara Mustard [place] Patient [facility] for Infectious Disease Phone: [phone] Fax:  [phone]

## 2019-07-08 ENCOUNTER — Ambulatory Visit (INDEPENDENT_AMBULATORY_CARE_PROVIDER_SITE_OTHER): Payer: Self-pay | Admitting: Pharmacist

## 2019-07-08 ENCOUNTER — Other Ambulatory Visit: Payer: Self-pay

## 2019-07-08 DIAGNOSIS — B0229 Other postherpetic nervous system involvement: Secondary | ICD-10-CM

## 2019-07-08 DIAGNOSIS — B20 Human immunodeficiency virus [HIV] disease: Secondary | ICD-10-CM

## 2019-07-08 MED ORDER — AMITRIPTYLINE HCL 50 MG PO TABS: 50.0000 mg | ORAL_TABLET | Freq: Every day | ORAL | 5 refills | Status: DC

## 2019-07-08 MED ORDER — TRIUMEQ 600-50-300 MG PO TABS: 1.0000 | ORAL_TABLET | Freq: Every day | ORAL | 2 refills | Status: DC

## 2019-07-08 MED FILL — AMITRIPTYLINE HCL 50 MG TAB: 50 | 30 days supply | Qty: 30 | Fill #0

## 2019-07-08 MED FILL — TRIUMEQ 600-50-300 MG TABS: 600-50-300 | 30 days supply | Qty: 30 | Fill #0

## 2019-07-08 NOTE — Progress Notes (Signed)
HPI: Arthur Lynch is a 59 y.o. male who presents to the [place] clinic for HIV follow-up.  Patient Active Problem List   Diagnosis Date Noted  . Viral gastroenteritis 09/16/2018  . Healthcare maintenance 07/02/2018  . Infection of lip 07/28/2016  . Infected lip laceration 07/28/2016  . Alcohol dependence with acute alcoholic intoxication ([place]) 07/25/2016  . Major depressive disorder, recurrent severe without psychotic features ([place]) 07/25/2016  . MDD (major depressive disorder) 07/25/2016  . Atypical chest pain 10/06/2015  . Eczema 10/06/2015  . Alcoholism in remission ([place]) 08/16/2015  . HIV disease ([place]) 06/04/2014  . HZV (herpes zoster virus) post herpetic neuralgia 06/04/2014  . Rash and nonspecific skin eruption 05/11/2014  . Rib pain on left side 10/15/2013  . Abnormal Pap smear   . ASCUS (atypical squamous cells of undetermined significance) on Pap smear 07/24/2012  . Insomnia 12/20/2011  . Anemia 06/21/2011  . Gynecomastia 05/31/2011  . Zoster ophthalmicus 05/31/2011  . Headache(784.0) 02/15/2011  . Neuropathic pain 11/02/2010  . Cyst on ear 11/02/2010  . ACUTE GASTRITIS WITHOUT MENTION OF HEMORRHAGE 12/29/2009  . CONSTIPATION 12/29/2009  . Alcohol dependence with alcohol-induced mood disorder ([place]) 06/07/2009  . HERPES ZOSTER 12/07/2008  . NEURALGIA, TRIGEMINAL 12/07/2008  . ANEMIA, MACROCYTIC, CHRONIC 11/23/2008  . POLYURIA 11/23/2008  . NOCTURIA 11/23/2008  . GYNECOMASTIA 09/21/2008  . CARBUNCLE AND FURUNCLE OF UNSPECIFIED SITE 06/19/2008  . Leukocytopenia 03/16/2008  . ROSACEA 07/22/2007  . EDEMA 07/22/2007  . PAROXYSMAL NOCTURNAL DYSPNEA 04/01/2007  . Pruritic disorder 01/24/2007  . DIARRHEA 01/24/2007  . Major depressive disorder with current active episode 12/10/2006  . Human immunodeficiency virus (HIV) disease ([place]) 01/13/2006    Patient's Medications  New Prescriptions   No medications on file  Previous Medications   ABACAVIR-DOLUTEGRAVIR-LAMIVUDINE (TRIUMEQ) 600-50-300 MG TABLET    Take 1 tablet by mouth daily.  Modified Medications   Modified Medication Previous Medication   AMITRIPTYLINE (ELAVIL) 50 MG TABLET amitriptyline (ELAVIL) 50 MG tablet      Take 1 tablet (50 mg total) by mouth at bedtime.    Take 1 tablet (50 mg total) by mouth at bedtime.  Discontinued Medications   No medications on file    Allergies: Allergies  Allergen Reactions  . Penicillins Itching    Has patient had a PCN reaction causing immediate rash, facial/tongue/throat swelling, SOB or lightheadedness with hypotension: No Has patient had a PCN reaction causing severe rash involving mucus membranes or skin necrosis: No Has patient had a PCN reaction that required hospitalization No Has patient had a PCN reaction occurring within the last 10 years: No If all of the above answers are "NO", then may proceed with Cephalosporin use.     Past Medical History: Past Medical History:  Diagnosis Date  . Abnormal Pap smear   . Alcoholism in remission ([place]) 08/16/2015  . Anemia   . Atypical chest pain 10/06/2015  . Cancer ([place])   . COPD (chronic obstructive pulmonary disease) ([place])   . Depression   . Eczema 10/06/2015  . HIV (human immunodeficiency virus infection) ([place])   . Murmur, heart   . Neuromuscular disorder ([place])   . Substance abuse ([place])     Social History: Social History   Socioeconomic History  . Marital status: Single    Spouse name: Not on file  . Number of children: Not on file  . Years of education: Not on file  . Highest education level: Not on file  Occupational History  .  Not on file  Tobacco Use  . Smoking status: Never Smoker  . Smokeless tobacco: Never Used  . Tobacco comment: Given condoms  Substance and Sexual Activity  . Alcohol use: Yes    Alcohol/week: 45.0 standard drinks    Types: 45 Standard drinks or equivalent per week    Comment: relapsed,   . Drug use: No  . Sexual activity:  Never    Partners: Male    Birth control/protection: Condom    Comment: given condoms  Other Topics Concern  . Not on file  Social History Narrative  . Not on file   Social Determinants of Health   Financial Resource Strain:   . Difficulty of Paying Living Expenses:   Food Insecurity:   . Worried About Charity fundraiser in the Last Year:   . Arboriculturist in the Last Year:   Transportation Needs:   . Film/video editor (Medical):   Marland Kitchen Lack of Transportation (Non-Medical):   Physical Activity:   . Days of Exercise per Week:   . Minutes of Exercise per Session:   Stress:   . Feeling of Stress :   Social Connections:   . Frequency of Communication with Friends and Family:   . Frequency of Social Gatherings with Friends and Family:   . Attends Religious Services:   . Active Member of Clubs or Organizations:   . Attends Archivist Meetings:   Marland Kitchen Marital Status:     Labs: Lab Results  Component Value Date   HIV1RNAQUANT 248 (H) 09/16/2018   HIV1RNAQUANT 82 (H) 07/02/2018   HIV1RNAQUANT 2,960 (H) 06/04/2018   CD4TABS 277 (L) 09/16/2018   CD4TABS 350 (L) 07/02/2018   CD4TABS 170 (L) 06/04/2018    RPR and STI Lab Results  Component Value Date   LABRPR NON-REACTIVE 06/04/2018   LABRPR NON-REACTIVE 04/04/2017   LABRPR NON REAC 10/18/2016   LABRPR NON REAC 08/23/2016   LABRPR NON REAC 07/10/2016    STI Results GC CT  06/04/2018 Negative Negative  06/04/2018 Negative Negative  06/04/2018 Negative Negative  10/18/2016 Negative Negative  08/23/2016 Negative Negative  07/10/2016 Negative Negative  08/10/2015 Negative Negative  06/15/2015 Negative Negative  11/09/2014 Negative Negative  03/24/2014 NG: Negative CT: Negative  01/09/2014 NG: Negative CT: Negative    Hepatitis B Lab Results  Component Value Date   HEPBSAB NO 05/07/2006   HEPBSAG NO 05/07/2006   Hepatitis C No results found for: HEPCAB, HCVRNAPCRQN Hepatitis A No results found for:  HAV Lipids: Lab Results  Component Value Date   CHOL 176 10/18/2016   TRIG 61 10/18/2016   HDL 98 10/18/2016   CHOLHDL 1.8 10/18/2016   VLDL 12 10/18/2016   LDLCALC 66 10/18/2016    Current HIV Regimen: Triumeq  Assessment: Arthur Lynch is here today to re-establish care for his HIV. We last saw him in clinic in July of 2020 where his VL was detectable at 248 and CD4 count was 277. He was taking Triumeq at that time, but later that year around November he admits to going off his medication. We spoke about what caused him to stop taking his medication and he says it was a combination of things. He was having a hard time at work, coronavirus was scary and he didn't have a great support system in place to help him. He says he has decided it is time to turn things around and he is making changes in multiple aspects of his life. He is  applying for jobs and waiting to hear back currently, but he is hopeful. He had some trouble with drinking when he was feeling down, but he is stopping the drinking now that he is getting back on medication. He realizes the importance of being adherent to his medication, and the risks associated with HIV if he was to stop his medication, such as opportunistic infections. He also says he is grateful to have a support system back in his life with family and friends that he sees and speaks on the phone with frequently. I congratulated him on taking these steps to better himself and told him I am optimistic he will do very well with all these changes he has made. We are available to him if he should run into any medication issues with his care.  He has some issues with post herpetic neuralgia on his face that he has been dealing with for some time now. He had taken Elvail in the past which he said would relieve his symptoms when needed. He also has some lidocaine that he applies to his face to help. We are working on his UMAP approval today and hope to have that soon, in the meantime  we have gotten approval for his Triumeq and the Elavil should be affordable over the counter. Karn Pickler will assist with him getting these medications today when he leaves. We have not gotten labs on him since last year so we will get some blood work as well as RPR, urine/rectal/oral cytology. Although he does say he has not had any new partners since last March. Depending on how his labs look we may need to start him on some opportunistic infection prophylaxis. We talked about what this means in the visit and that we would be in contact with him if that is something that needed to happen. He will re-establish care with Marya Amsler in a month to make sure he is doing fine back on medication.   Plan: -Refill his Triumeq and Elavil [facility]  -UMAP renewal -Urine, rectal, oral cytology, lipid panel, RPR, HIV RNA W/R GT, CBC w/Diff, CMET, CD4 count -Follow up with Marya Amsler 08/08/2019  Nicoletta Dress, PharmD PGY2 Infectious Disease Pharmacy Resident  [facility] 07/08/2019, 11:24 AM

## 2019-07-08 NOTE — Addendum Note (Signed)
Addended by: Darletta Moll on: 07/08/2019 02:18 PM   Modules accepted: Orders

## 2019-07-09 ENCOUNTER — Ambulatory Visit: Payer: Self-pay | Admitting: Pharmacist

## 2019-07-09 LAB — CYTOLOGY, (ORAL, ANAL, URETHRAL) ANCILLARY ONLY
Chlamydia: NEGATIVE
Chlamydia: NEGATIVE
Comment: NEGATIVE
Comment: NEGATIVE
Comment: NORMAL
Comment: NORMAL
Neisseria Gonorrhea: NEGATIVE
Neisseria Gonorrhea: NEGATIVE

## 2019-07-09 LAB — T-HELPER CELL (CD4) - (RCID CLINIC ONLY)
CD4 % Helper T Cell: 30 % — ABNORMAL LOW (ref 33–65)
CD4 T Cell Abs: 545 /uL (ref 400–1790)

## 2019-07-09 LAB — URINE CYTOLOGY ANCILLARY ONLY
Chlamydia: NEGATIVE
Comment: NEGATIVE
Comment: NORMAL
Neisseria Gonorrhea: NEGATIVE

## 2019-07-13 LAB — COMPREHENSIVE METABOLIC PANEL
AG Ratio: 0.9 (calc) — ABNORMAL LOW (ref 1.0–2.5)
ALT: 15 U/L (ref 9–46)
AST: 46 U/L — ABNORMAL HIGH (ref 10–35)
Albumin: 3.7 g/dL (ref 3.6–5.1)
Alkaline phosphatase (APISO): 83 U/L (ref 35–144)
BUN: 8 mg/dL (ref 7–25)
CO2: 30 mmol/L (ref 20–32)
Calcium: 9.6 mg/dL (ref 8.6–10.3)
Chloride: 98 mmol/L (ref 98–110)
Creat: 0.94 mg/dL (ref 0.70–1.33)
Globulin: 4.2 g/dL (calc) — ABNORMAL HIGH (ref 1.9–3.7)
Glucose, Bld: 131 mg/dL — ABNORMAL HIGH (ref 65–99)
Potassium: 3.8 mmol/L (ref 3.5–5.3)
Sodium: 137 mmol/L (ref 135–146)
Total Bilirubin: 1.1 mg/dL (ref 0.2–1.2)
Total Protein: 7.9 g/dL (ref 6.1–8.1)

## 2019-07-13 LAB — LIPID PANEL
Cholesterol: 151 mg/dL (ref <200)
HDL: 57 mg/dL (ref >=40)
LDL Cholesterol (Calc): 80 mg/dL (calc)
Non-HDL Cholesterol (Calc): 94 mg/dL (calc) (ref <130)
Total CHOL/HDL Ratio: 2.6 (calc) (ref <5.0)
Triglycerides: 55 mg/dL (ref <150)

## 2019-07-13 LAB — CBC WITH DIFFERENTIAL/PLATELET
Absolute Monocytes: 826 cells/uL (ref 200–950)
Basophils Absolute: 84 cells/uL (ref 0–200)
Basophils Relative: 1.2 %
Eosinophils Absolute: 315 cells/uL (ref 15–500)
Eosinophils Relative: 4.5 %
HCT: 35.3 % — ABNORMAL LOW (ref 38.5–50.0)
Hemoglobin: 12.2 g/dL — ABNORMAL LOW (ref 13.2–17.1)
Lymphs Abs: 1883 cells/uL (ref 850–3900)
MCH: 36 pg — ABNORMAL HIGH (ref 27.0–33.0)
MCHC: 34.6 g/dL (ref 32.0–36.0)
MCV: 104.1 fL — ABNORMAL HIGH (ref 80.0–100.0)
MPV: 9 fL (ref 7.5–12.5)
Monocytes Relative: 11.8 %
Neutro Abs: 3892 cells/uL (ref 1500–7800)
Neutrophils Relative %: 55.6 %
Platelets: 553 10*3/uL — ABNORMAL HIGH (ref 140–400)
RBC: 3.39 10*6/uL — ABNORMAL LOW (ref 4.20–5.80)
RDW: 12 % (ref 11.0–15.0)
Total Lymphocyte: 26.9 %
WBC: 7 10*3/uL (ref 3.8–10.8)

## 2019-07-13 LAB — HCV RNA,QUANTITATIVE REAL TIME PCR
HCV Quantitative Log: <1.18 Log IU/mL
HCV RNA, PCR, QN: <15 IU/mL

## 2019-07-13 LAB — HIV RNA, RTPCR W/R GT (RTI, PI,INT)
HIV 1 RNA Quant: 103 copies/mL — ABNORMAL HIGH
HIV-1 RNA Quant, Log: 2.01 Log copies/mL — ABNORMAL HIGH

## 2019-07-13 LAB — HEPATITIS C ANTIBODY
Hepatitis C Ab: REACTIVE — AB
SIGNAL TO CUT-OFF: 1.8 — ABNORMAL HIGH (ref <1.00)

## 2019-07-13 LAB — RPR: RPR Ser Ql: NONREACTIVE

## 2019-08-08 ENCOUNTER — Ambulatory Visit (INDEPENDENT_AMBULATORY_CARE_PROVIDER_SITE_OTHER): Payer: Self-pay | Admitting: Family

## 2019-08-08 ENCOUNTER — Encounter: Payer: Self-pay | Admitting: Family

## 2019-08-08 ENCOUNTER — Other Ambulatory Visit: Payer: Self-pay

## 2019-08-08 DIAGNOSIS — B20 Human immunodeficiency virus [HIV] disease: Secondary | ICD-10-CM

## 2019-08-08 DIAGNOSIS — B0229 Other postherpetic nervous system involvement: Secondary | ICD-10-CM

## 2019-08-08 MED ORDER — TRIUMEQ 600-50-300 MG PO TABS: 1.0000 | ORAL_TABLET | Freq: Every day | ORAL | 2 refills | Status: DC

## 2019-08-08 MED ORDER — AMITRIPTYLINE HCL 50 MG PO TABS: 50.0000 mg | ORAL_TABLET | Freq: Every day | ORAL | 5 refills | Status: DC

## 2019-08-08 NOTE — Assessment & Plan Note (Signed)
Mr. Dorminy appears to have improved adherence and tolerance to his ART regimen of Triumeq.  No signs/symptoms of opportunistic infection or progressive HIV disease.  Commended on the improvements that he has made.  Discussed blood work and plan of care.  Continue current dose of Triumeq.  Plan for follow-up in 1 month or sooner if needed.  We will need to renew financial assistance after July 1.Marland Kitchen

## 2019-08-08 NOTE — Progress Notes (Signed)
Subjective:    Patient ID: Arthur Lynch, male    DOB: 1960/05/16, 59 y.o.   MRN: [id number]  Chief Complaint  Patient presents with  . Follow-up    reports experiencing constipation a few days ago, severe abdominal distention; has had a BM since; asked about vitamins; ADAP application pending; seeing Don today     HPI:  Arthur Lynch is a 59 y.o. male with HIV disease who was last seen in the office on 09/16/18 with well controlled HIV disease with good adherence and tolerance to his ART regimen of Tirumeq. He was subsequently seen by Cassie Kuppleweiser, Pharm.D, CPP in 07/08/19 with blood work showing a viral load at 103 with CD4 of 545.   Arthur Lynch has been taking his Triumeq as prescribed with no adverse side effects or missed doses since his last office visit with the pharmacy staff.  He had been off his medication for several months prior to that.  This was due to depression as well as alcohol consumption which interfered with his ability to take his medications daily as prescribed.  He is now in a better place.  Overall feeling well today with no new concerns/complaints. Denies fevers, chills, night sweats, headaches, changes in vision, neck pain/stiffness, nausea, diarrhea, vomiting, lesions or rashes.  Arthur Lynch has no problems obtaining his medication from the pharmacy.  Denies feelings of being down, depressed, or hopeless currently which is significantly improving.  No suicidal ideations.  No current recreational or illicit drug use, tobacco use, or alcohol consumption.  Not sexually active and declines condoms.                                                                                      Allergies  Allergen Reactions  . Penicillins Itching    Has patient had a PCN reaction causing immediate rash, facial/tongue/throat swelling, SOB or lightheadedness with hypotension: No Has patient had a PCN reaction causing severe rash involving mucus membranes or skin necrosis:  No Has patient had a PCN reaction that required hospitalization No Has patient had a PCN reaction occurring within the last 10 years: No If all of the above answers are "NO", then may proceed with Cephalosporin use.       Outpatient Medications Prior to Visit  Medication Sig Dispense Refill  . abacavir-dolutegravir-lamiVUDine (TRIUMEQ) 600-50-300 MG tablet Take 1 tablet by mouth daily. 30 tablet 2  . amitriptyline (ELAVIL) 50 MG tablet Take 1 tablet (50 mg total) by mouth at bedtime. 30 tablet 5   No facility-administered medications prior to visit.     Past Medical History:  Diagnosis Date  . Abnormal Pap smear   . Alcoholism in remission ([place]) 08/16/2015  . Anemia   . Atypical chest pain 10/06/2015  . Cancer ([place])   . COPD (chronic obstructive pulmonary disease) ([place])   . Depression   . Eczema 10/06/2015  . HIV (human immunodeficiency virus infection) ([place])   . Murmur, heart   . Neuromuscular disorder ([place])   . Substance abuse [facility])      Past Surgical History:  Procedure Laterality Date  . ANOSCOPY  Review of Systems  Constitutional: Negative for appetite change, chills, fatigue, fever and unexpected weight change.  Eyes: Negative for visual disturbance.  Respiratory: Negative for cough, chest tightness, shortness of breath and wheezing.   Cardiovascular: Negative for chest pain and leg swelling.  Gastrointestinal: Negative for abdominal pain, constipation, diarrhea, nausea and vomiting.  Genitourinary: Negative for dysuria, flank pain, frequency, genital sores, hematuria and urgency.  Skin: Negative for rash.  Allergic/Immunologic: Negative for immunocompromised state.  Neurological: Negative for dizziness and headaches.      Objective:    BP 113/79   Pulse (!) 120   Wt 134 lb 9.6 oz (61.1 kg)   BMI 17.28 kg/m  Nursing note and vital signs reviewed.  Physical Exam Constitutional:      General: He is not in acute distress.    Appearance: He is  well-developed.  Eyes:     Conjunctiva/sclera: Conjunctivae normal.  Cardiovascular:     Rate and Rhythm: Normal rate and regular rhythm.     Heart sounds: Normal heart sounds. No murmur. No friction rub. No gallop.   Pulmonary:     Effort: Pulmonary effort is normal. No respiratory distress.     Breath sounds: Normal breath sounds. No wheezing or rales.  Chest:     Chest wall: No tenderness.  Abdominal:     General: Bowel sounds are normal.     Palpations: Abdomen is soft.     Tenderness: There is no abdominal tenderness.  Musculoskeletal:     Cervical back: Neck supple.  Lymphadenopathy:     Cervical: No cervical adenopathy.  Skin:    General: Skin is warm and dry.     Findings: No rash.  Neurological:     Mental Status: He is alert and oriented to person, place, and time.  Psychiatric:        Behavior: Behavior normal.        Thought Content: Thought content normal.        Judgment: Judgment normal.      Depression screen [facility] 2/9 08/08/2019 07/02/2018 06/04/2018 04/04/2017 12/07/2015  Decreased Interest 0 1 0 0 0  Down, Depressed, Hopeless 0 1 1 1  0  PHQ - 2 Score 0 2 1 1  0  Altered sleeping - - - - -  Tired, decreased energy - - - - -  Change in appetite - - - - -  Feeling bad or failure about yourself  - - - - -  Trouble concentrating - - - - -  Moving slowly or fidgety/restless - - - - -  Suicidal thoughts - - - - -  PHQ-9 Score - - - - -  Some recent data might be hidden       Assessment & Plan:    Patient Active Problem List   Diagnosis Date Noted  . Viral gastroenteritis 09/16/2018  . Healthcare maintenance 07/02/2018  . Infection of lip 07/28/2016  . Infected lip laceration 07/28/2016  . Alcohol dependence with acute alcoholic intoxication ([place]) 07/25/2016  . Major depressive disorder, recurrent severe without psychotic features ([place]) 07/25/2016  . MDD (major depressive disorder) 07/25/2016  . Atypical chest pain 10/06/2015  . Eczema 10/06/2015  .  Alcoholism in remission ([place]) 08/16/2015  . HIV disease ([place]) 06/04/2014  . HZV (herpes zoster virus) post herpetic neuralgia 06/04/2014  . Rash and nonspecific skin eruption 05/11/2014  . Rib pain on left side 10/15/2013  . Abnormal Pap smear   . ASCUS (atypical squamous cells of undetermined  significance) on Pap smear 07/24/2012  . Insomnia 12/20/2011  . Anemia 06/21/2011  . Gynecomastia 05/31/2011  . Zoster ophthalmicus 05/31/2011  . Headache(784.0) 02/15/2011  . Neuropathic pain 11/02/2010  . Cyst on ear 11/02/2010  . ACUTE GASTRITIS WITHOUT MENTION OF HEMORRHAGE 12/29/2009  . CONSTIPATION 12/29/2009  . Alcohol dependence with alcohol-induced mood disorder ([place]) 06/07/2009  . HERPES ZOSTER 12/07/2008  . NEURALGIA, TRIGEMINAL 12/07/2008  . ANEMIA, MACROCYTIC, CHRONIC 11/23/2008  . POLYURIA 11/23/2008  . NOCTURIA 11/23/2008  . GYNECOMASTIA 09/21/2008  . CARBUNCLE AND FURUNCLE OF UNSPECIFIED SITE 06/19/2008  . Leukocytopenia 03/16/2008  . ROSACEA 07/22/2007  . EDEMA 07/22/2007  . PAROXYSMAL NOCTURNAL DYSPNEA 04/01/2007  . Pruritic disorder 01/24/2007  . DIARRHEA 01/24/2007  . Major depressive disorder with current active episode 12/10/2006  . Human immunodeficiency virus (HIV) disease ([place]) 01/13/2006     Problem List Items Addressed This Visit      Nervous and Auditory   HZV (herpes zoster virus) post herpetic neuralgia   Relevant Medications   amitriptyline (ELAVIL) 50 MG tablet     Other   HIV disease ([place])    Arthur Lynch appears to have improved adherence and tolerance to his ART regimen of Triumeq.  No signs/symptoms of opportunistic infection or progressive HIV disease.  Commended on the improvements that he has made.  Discussed blood work and plan of care.  Continue current dose of Triumeq.  Plan for follow-up in 1 month or sooner if needed.  We will need to renew financial assistance after July 1..      Relevant Medications   abacavir-dolutegravir-lamiVUDine  (TRIUMEQ) 600-50-300 MG tablet       I am having Flonnie Hailstone maintain his Triumeq and amitriptyline.   Meds ordered this encounter  Medications  . abacavir-dolutegravir-lamiVUDine (TRIUMEQ) 600-50-300 MG tablet    Sig: Take 1 tablet by mouth daily.    Dispense:  30 tablet    Refill:  2    Order Specific Question:   Supervising Provider    Answer:   Carlyle Basques [[id number]]  . amitriptyline (ELAVIL) 50 MG tablet    Sig: Take 1 tablet (50 mg total) by mouth at bedtime.    Dispense:  30 tablet    Refill:  5    Order Specific Question:   Supervising Provider    Answer:   Carlyle Basques [[id number]]     Follow-up: Return in about 1 month (around 09/08/2019), or if symptoms worsen or fail to improve.   Terri Piedra, MSN, FNP-C Nurse Practitioner [facility] for [facility] Disease [place] number: [phone]

## 2019-08-08 NOTE — Patient Instructions (Signed)
Nice to see you.  We will continue your Triumeq as prescribed daily  Refills have been sent to pharmacy.   You are doing a great job - keep it up!  Plan for follow up office visit in 1 month or sooner if needed with lab work on the same day.   Have a great day and stay safe!

## 2019-09-02 ENCOUNTER — Encounter: Payer: Self-pay | Admitting: Family

## 2019-09-05 ENCOUNTER — Ambulatory Visit: Payer: Self-pay | Admitting: Family

## 2019-10-20 ENCOUNTER — Ambulatory Visit: Payer: Self-pay | Admitting: Family

## 2019-10-24 ENCOUNTER — Ambulatory Visit (INDEPENDENT_AMBULATORY_CARE_PROVIDER_SITE_OTHER): Payer: Self-pay | Admitting: Family

## 2019-10-24 ENCOUNTER — Ambulatory Visit: Payer: Self-pay

## 2019-10-24 ENCOUNTER — Encounter: Payer: Self-pay | Admitting: Family

## 2019-10-24 ENCOUNTER — Other Ambulatory Visit: Payer: Self-pay

## 2019-10-24 VITALS — BP 102/71 | HR 104 | Temp 98.0°F | Wt 132.0 lb

## 2019-10-24 DIAGNOSIS — Z0000 Encounter for general adult medical examination without abnormal findings: Secondary | ICD-10-CM

## 2019-10-24 DIAGNOSIS — B20 Human immunodeficiency virus [HIV] disease: Secondary | ICD-10-CM

## 2019-10-24 DIAGNOSIS — F332 Major depressive disorder, recurrent severe without psychotic features: Secondary | ICD-10-CM

## 2019-10-24 LAB — T-HELPER CELL (CD4) - (RCID CLINIC ONLY)
CD4 % Helper T Cell: 25 % — ABNORMAL LOW (ref 33–65)
CD4 T Cell Abs: 489 /uL (ref 400–1790)

## 2019-10-24 MED ORDER — TRIUMEQ 600-50-300 MG PO TABS: 1.0000 | ORAL_TABLET | Freq: Every day | ORAL | 2 refills | Status: DC

## 2019-10-24 NOTE — Progress Notes (Signed)
Subjective:    Patient ID: Arthur Lynch, male    DOB: August 19, 1960, 59 y.o.   MRN: [id number]  Chief Complaint  Patient presents with  . Follow-up    Relapsed on drinking alcohol and needs to see Marcie Bal for counseling     HPI:  Arthur Lynch is a 59 y.o. male with HIV disease who was last seen in the office on 08/08/19 with improved adherence his ART regimen of Triumeq. Blood work at the time showed a viral load of 103 with CD4 count of 545. RPR was negative for syphilis. Here today for routine follow up.  Arthur Lynch has not been as adherent to his ART regimen of Triumeq since the last office visit and anticipates his blood work not being very good. Feeling down today and is working on getting his life in order. Has started working part time at [employer]. Denies fevers, chills, night sweats, headaches, changes in vision, neck pain/stiffness, nausea, diarrhea, vomiting, lesions or rashes.  Arthur Lynch has no problems obtaining his medication and continues to have coverage through [place]. Financial assistance has been renewed with [place]. As previously noted has been feeling lonely and describes not having a lot of friends. No thoughts of harming himself. Interested in speaking with counseling. Continues to take amitriptyline for neurological pain. Not currently sexually active and declines condoms. No recreational or illicit drug use. Did have a relapse of alcohol recently.    Allergies  Allergen Reactions  . Penicillins Itching    Has patient had a PCN reaction causing immediate rash, facial/tongue/throat swelling, SOB or lightheadedness with hypotension: No Has patient had a PCN reaction causing severe rash involving mucus membranes or skin necrosis: No Has patient had a PCN reaction that required hospitalization No Has patient had a PCN reaction occurring within the last 10 years: No If all of the above answers are "NO", then may proceed with Cephalosporin use.        Outpatient Medications Prior to Visit  Medication Sig Dispense Refill  . amitriptyline (ELAVIL) 50 MG tablet Take 1 tablet (50 mg total) by mouth at bedtime. 30 tablet 5  . abacavir-dolutegravir-lamiVUDine (TRIUMEQ) 600-50-300 MG tablet Take 1 tablet by mouth daily. 30 tablet 2   No facility-administered medications prior to visit.     Past Medical History:  Diagnosis Date  . Abnormal Pap smear   . Alcoholism in remission ([place]) 08/16/2015  . Anemia   . Atypical chest pain 10/06/2015  . Cancer ([place])   . COPD (chronic obstructive pulmonary disease) ([place])   . Depression   . Eczema 10/06/2015  . HIV (human immunodeficiency virus infection) ([place])   . Murmur, heart   . Neuromuscular disorder ([place])   . Substance abuse [facility])      Past Surgical History:  Procedure Laterality Date  . ANOSCOPY      Review of Systems  Constitutional: Negative for appetite change, chills, fatigue, fever and unexpected weight change.  Eyes: Negative for visual disturbance.  Respiratory: Negative for cough, chest tightness, shortness of breath and wheezing.   Cardiovascular: Negative for chest pain and leg swelling.  Gastrointestinal: Negative for abdominal pain, constipation, diarrhea, nausea and vomiting.  Genitourinary: Negative for dysuria, flank pain, frequency, genital sores, hematuria and urgency.  Skin: Negative for rash.  Allergic/Immunologic: Negative for immunocompromised state.  Neurological: Negative for dizziness and headaches.  Psychiatric/Behavioral: Positive for dysphoric mood. Negative for self-injury, sleep disturbance and suicidal ideas. The patient is not nervous/anxious.  Objective:    BP 102/71   Pulse (!) 104   Temp 98 F (36.7 C)   Wt 132 lb (59.9 kg)   SpO2 97%   BMI 16.95 kg/m  Nursing note and vital signs reviewed.  Physical Exam Constitutional:      General: He is not in acute distress.    Appearance: He is well-developed.  Eyes:      Conjunctiva/sclera: Conjunctivae normal.  Cardiovascular:     Rate and Rhythm: Normal rate and regular rhythm.     Heart sounds: Normal heart sounds. No murmur heard.  No friction rub. No gallop.   Pulmonary:     Effort: Pulmonary effort is normal. No respiratory distress.     Breath sounds: Normal breath sounds. No wheezing or rales.  Chest:     Chest wall: No tenderness.  Abdominal:     General: Bowel sounds are normal.     Palpations: Abdomen is soft.     Tenderness: There is no abdominal tenderness.  Musculoskeletal:     Cervical back: Neck supple.  Lymphadenopathy:     Cervical: No cervical adenopathy.  Skin:    General: Skin is warm and dry.     Findings: No rash.  Neurological:     Mental Status: He is alert and oriented to person, place, and time.  Psychiatric:        Mood and Affect: Mood is depressed.        Behavior: Behavior normal.        Thought Content: Thought content normal.        Judgment: Judgment normal.      Depression screen [facility] 2/9 10/24/2019 10/24/2019 08/08/2019 07/02/2018 06/04/2018  Decreased Interest 0 0 0 1 0  Down, Depressed, Hopeless - 0 0 1 1  PHQ - 2 Score 0 0 0 2 1  Altered sleeping - - - - -  Tired, decreased energy - - - - -  Change in appetite - - - - -  Feeling bad or failure about yourself  - - - - -  Trouble concentrating - - - - -  Moving slowly or fidgety/restless - - - - -  Suicidal thoughts - - - - -  PHQ-9 Score - - - - -  Some recent data might be hidden       Assessment & Plan:    Patient Active Problem List   Diagnosis Date Noted  . Viral gastroenteritis 09/16/2018  . Healthcare maintenance 07/02/2018  . Infection of lip 07/28/2016  . Infected lip laceration 07/28/2016  . Alcohol dependence with acute alcoholic intoxication ([place]) 07/25/2016  . Major depressive disorder, recurrent severe without psychotic features ([place]) 07/25/2016  . MDD (major depressive disorder) 07/25/2016  . Atypical chest pain 10/06/2015  .  Eczema 10/06/2015  . Alcoholism in remission ([place]) 08/16/2015  . HIV disease ([place]) 06/04/2014  . HZV (herpes zoster virus) post herpetic neuralgia 06/04/2014  . Rash and nonspecific skin eruption 05/11/2014  . Rib pain on left side 10/15/2013  . Abnormal Pap smear   . ASCUS (atypical squamous cells of undetermined significance) on Pap smear 07/24/2012  . Insomnia 12/20/2011  . Anemia 06/21/2011  . Gynecomastia 05/31/2011  . Zoster ophthalmicus 05/31/2011  . Headache(784.0) 02/15/2011  . Neuropathic pain 11/02/2010  . Cyst on ear 11/02/2010  . ACUTE GASTRITIS WITHOUT MENTION OF HEMORRHAGE 12/29/2009  . CONSTIPATION 12/29/2009  . Alcohol dependence with alcohol-induced mood disorder ([place]) 06/07/2009  . HERPES ZOSTER 12/07/2008  .  NEURALGIA, TRIGEMINAL 12/07/2008  . ANEMIA, MACROCYTIC, CHRONIC 11/23/2008  . POLYURIA 11/23/2008  . NOCTURIA 11/23/2008  . GYNECOMASTIA 09/21/2008  . CARBUNCLE AND FURUNCLE OF UNSPECIFIED SITE 06/19/2008  . Leukocytopenia 03/16/2008  . ROSACEA 07/22/2007  . EDEMA 07/22/2007  . PAROXYSMAL NOCTURNAL DYSPNEA 04/01/2007  . Pruritic disorder 01/24/2007  . DIARRHEA 01/24/2007  . Major depressive disorder with current active episode 12/10/2006  . Human immunodeficiency virus (HIV) disease ([place]) 01/13/2006     Problem List Items Addressed This Visit      Other   HIV disease ([place]) - Primary   Relevant Medications   abacavir-dolutegravir-lamiVUDine (TRIUMEQ) 600-50-300 MG tablet   Other Relevant Orders   CBC   COMPLETE METABOLIC PANEL WITH GFR   HIV-1 RNA quant-no reflex-bld   T-helper cell (CD4)- ([facility] clinic only)   MDD (major depressive disorder)    Arthur Lynch has been having issues with depression recently. No thoughts of suicidal ideations or signs of psychosis. Will be scheduling with [facility] counseling. Discussed resources that are available. He has made many positive strides recently including having a part-time job and was recently gifted a car.  Discussed options such as [place] as needed for support. He will continue to work with [employer]. Plan for follow up in 1 month or sooner if needed.       Healthcare maintenance     Discussed importance of safe sexual practice to reduce risk of STI. Condoms declined.   Due for colonoscopy for colon cancer screening.  Refer to [place] clinic for routine dental care.           I am having Flonnie Hailstone maintain his amitriptyline and Triumeq.   Meds ordered this encounter  Medications  . abacavir-dolutegravir-lamiVUDine (TRIUMEQ) 600-50-300 MG tablet    Sig: Take 1 tablet by mouth daily.    Dispense:  30 tablet    Refill:  2    Order Specific Question:   Supervising Provider    Answer:   Carlyle Basques [[id number]]     Follow-up: Return in about 1 month (around 11/24/2019), or if symptoms worsen or fail to improve.   Terri Piedra, MSN, FNP-C Nurse Practitioner [facility] for Infectious Disease [place] number: [phone]

## 2019-10-24 NOTE — Assessment & Plan Note (Signed)
Arthur Lynch has been having issues with depression recently. No thoughts of suicidal ideations or signs of psychosis. Will be scheduling with [facility] counseling. Discussed resources that are available. He has made many positive strides recently including having a part-time job and was recently gifted a car. Discussed options such as [employer] as needed for support. He will continue to work with [employer]. Plan for follow up in 1 month or sooner if needed.

## 2019-10-24 NOTE — Assessment & Plan Note (Signed)
·   Discussed importance of safe sexual practice to reduce risk of STI. Condoms declined.   Due for colonoscopy for colon cancer screening.  Refer to [place] clinic for routine dental care.

## 2019-10-24 NOTE — Patient Instructions (Signed)
Nice to see you.   We will continue with your current dose of Triumeq.  Work on getting you connected with [employer].  Schedule an appointment with Marcie Bal.  Plan for follow up in 1 month or sooner if needed.   Have a great day and stay safe!

## 2019-10-24 NOTE — Assessment & Plan Note (Signed)
Mr. Allemand is a 59 y/o male with HIV-1 disease with risk factor for acquiring HIV being MSM who has less than optimal adherence and good tolerance of his ART regimen of Triumeq. No signs/symptoms of opportunistic infection or progressive HIV disease. Discussed importance of taking medications on a regular basis. Financial assistance has been renewed. Check lab work today. Continue current dose of Triumeq for now. Continue working with bridge counseling. Plan for follow up in 1 month or sooner if needed.

## 2019-10-27 LAB — COMPLETE METABOLIC PANEL WITH GFR
AG Ratio: 1.1 (calc) (ref 1.0–2.5)
ALT: 42 U/L (ref 9–46)
AST: 115 U/L — ABNORMAL HIGH (ref 10–35)
Albumin: 3.4 g/dL — ABNORMAL LOW (ref 3.6–5.1)
Alkaline phosphatase (APISO): 88 U/L (ref 35–144)
BUN: 11 mg/dL (ref 7–25)
CO2: 30 mmol/L (ref 20–32)
Calcium: 8.9 mg/dL (ref 8.6–10.3)
Chloride: 104 mmol/L (ref 98–110)
Creat: 0.88 mg/dL (ref 0.70–1.33)
GFR, Est African American: 111 mL/min/{1.73_m2} (ref >=60)
GFR, Est Non African American: 96 mL/min/{1.73_m2} (ref >=60)
Globulin: 3.2 g/dL (calc) (ref 1.9–3.7)
Glucose, Bld: 92 mg/dL (ref 65–99)
Potassium: 4.4 mmol/L (ref 3.5–5.3)
Sodium: 141 mmol/L (ref 135–146)
Total Bilirubin: 0.4 mg/dL (ref 0.2–1.2)
Total Protein: 6.6 g/dL (ref 6.1–8.1)

## 2019-10-27 LAB — CBC
HCT: 33.6 % — ABNORMAL LOW (ref 38.5–50.0)
Hemoglobin: 11.6 g/dL — ABNORMAL LOW (ref 13.2–17.1)
MCH: 36.4 pg — ABNORMAL HIGH (ref 27.0–33.0)
MCHC: 34.5 g/dL (ref 32.0–36.0)
MCV: 105.3 fL — ABNORMAL HIGH (ref 80.0–100.0)
MPV: 9.8 fL (ref 7.5–12.5)
Platelets: 219 10*3/uL (ref 140–400)
RBC: 3.19 10*6/uL — ABNORMAL LOW (ref 4.20–5.80)
RDW: 12.7 % (ref 11.0–15.0)
WBC: 4.9 10*3/uL (ref 3.8–10.8)

## 2019-10-27 LAB — HIV-1 RNA QUANT-NO REFLEX-BLD
HIV 1 RNA Quant: <20 Copies/mL
HIV-1 RNA Quant, Log: <1.3 Log cps/mL

## 2019-11-04 ENCOUNTER — Ambulatory Visit: Payer: Self-pay

## 2019-11-06 ENCOUNTER — Ambulatory Visit: Payer: Self-pay

## 2019-11-13 ENCOUNTER — Encounter: Payer: Self-pay | Admitting: Family

## 2019-11-24 ENCOUNTER — Other Ambulatory Visit: Payer: Self-pay

## 2019-11-24 ENCOUNTER — Encounter: Payer: Self-pay | Admitting: Family

## 2019-11-24 ENCOUNTER — Ambulatory Visit (INDEPENDENT_AMBULATORY_CARE_PROVIDER_SITE_OTHER): Payer: Self-pay | Admitting: Family

## 2019-11-24 VITALS — BP 136/87 | HR 108 | Temp 98.7°F | Wt 135.0 lb

## 2019-11-24 DIAGNOSIS — Z0000 Encounter for general adult medical examination without abnormal findings: Secondary | ICD-10-CM

## 2019-11-24 DIAGNOSIS — F332 Major depressive disorder, recurrent severe without psychotic features: Secondary | ICD-10-CM

## 2019-11-24 DIAGNOSIS — B20 Human immunodeficiency virus [HIV] disease: Secondary | ICD-10-CM

## 2019-11-24 NOTE — Assessment & Plan Note (Signed)
Mr. Veith depression is doing significantly better as he continues to focus internally.  No suicidal ideations or signs of psychosis.  He is working full-time at [employer] which he says helps his mental status.  He will reschedule with our counselor.  Congratulated on his current progress.  Continue to monitor.

## 2019-11-24 NOTE — Assessment & Plan Note (Signed)
   Influenza vaccination updated today.  Discussed importance of safe sexual practice to reduce risk of STI.  Condoms declined.  We will call to schedule appointment with dental clinic for routine dental care.

## 2019-11-24 NOTE — Progress Notes (Signed)
Subjective:    Patient ID: Arthur Lynch, male    DOB: 07/02/1960, 59 y.o.   MRN: [id number]  Chief Complaint  Patient presents with  . Follow-up    B20.      HPI:  Arthur Lynch is a 59 y.o. male with HIV disease who was last seen in the office on 10/24/2019 with good adherence and tolerance to his ART regimen of Triumeq.  Viral load at the time was undetectable with CD4 count of 489.  He was also dealing with issues of depression.  Here today for routine follow-up.  Mr. Cockrell continues to take his Triumeq as prescribed with no adverse side effects or missed doses since his last office visit.  Overall he is feeling very well today.  He is enjoying his job at [employer] improvement.  Has no new concerns/complaints.  Unfortunately he did miss his appointment with a counselor and is eager to reschedule.  Mr. Withey has no problems obtaining his medications from the pharmacy.  Denies feelings of being down, depressed, or hopeless recently and feels like things are turning around.  Would like his influenza vaccination today.    Allergies  Allergen Reactions  . Penicillins Itching    Has patient had a PCN reaction causing immediate rash, facial/tongue/throat swelling, SOB or lightheadedness with hypotension: No Has patient had a PCN reaction causing severe rash involving mucus membranes or skin necrosis: No Has patient had a PCN reaction that required hospitalization No Has patient had a PCN reaction occurring within the last 10 years: No If all of the above answers are "NO", then may proceed with Cephalosporin use.       Outpatient Medications Prior to Visit  Medication Sig Dispense Refill  . abacavir-dolutegravir-lamiVUDine (TRIUMEQ) 600-50-300 MG tablet Take 1 tablet by mouth daily. 30 tablet 2  . amitriptyline (ELAVIL) 50 MG tablet Take 1 tablet (50 mg total) by mouth at bedtime. 30 tablet 5   No facility-administered medications prior to visit.     Past Medical  History:  Diagnosis Date  . Abnormal Pap smear   . Alcoholism in remission ([place]) 08/16/2015  . Anemia   . Atypical chest pain 10/06/2015  . Cancer ([place])   . COPD (chronic obstructive pulmonary disease) ([place])   . Depression   . Eczema 10/06/2015  . HIV (human immunodeficiency virus infection) ([place])   . Murmur, heart   . Neuromuscular disorder ([place])   . Substance abuse [facility])      Past Surgical History:  Procedure Laterality Date  . ANOSCOPY         Review of Systems  Constitutional: Negative for appetite change, chills, fatigue, fever and unexpected weight change.  Eyes: Negative for visual disturbance.  Respiratory: Negative for cough, chest tightness, shortness of breath and wheezing.   Cardiovascular: Negative for chest pain and leg swelling.  Gastrointestinal: Negative for abdominal pain, constipation, diarrhea, nausea and vomiting.  Genitourinary: Negative for dysuria, flank pain, frequency, genital sores, hematuria and urgency.  Skin: Negative for rash.  Allergic/Immunologic: Negative for immunocompromised state.  Neurological: Negative for dizziness and headaches.      Objective:    BP 136/87   Pulse (!) 108   Temp 98.7 F (37.1 C) (Oral)   Wt 135 lb (61.2 kg)   BMI 17.33 kg/m  Nursing note and vital signs reviewed.  Physical Exam Constitutional:      General: He is not in acute distress.    Appearance: He is well-developed.  Eyes:     Conjunctiva/sclera: Conjunctivae normal.  Cardiovascular:     Rate and Rhythm: Normal rate and regular rhythm.     Heart sounds: Normal heart sounds. No murmur heard.  No friction rub. No gallop.   Pulmonary:     Effort: Pulmonary effort is normal. No respiratory distress.     Breath sounds: Normal breath sounds. No wheezing or rales.  Chest:     Chest wall: No tenderness.  Abdominal:     General: Bowel sounds are normal.     Palpations: Abdomen is soft.     Tenderness: There is no abdominal tenderness.    Musculoskeletal:     Cervical back: Neck supple.  Lymphadenopathy:     Cervical: No cervical adenopathy.  Skin:    General: Skin is warm and dry.     Findings: No rash.  Neurological:     Mental Status: He is alert and oriented to person, place, and time.  Psychiatric:        Behavior: Behavior normal.        Thought Content: Thought content normal.        Judgment: Judgment normal.      Depression screen [facility] 2/9 11/24/2019 10/24/2019 10/24/2019 08/08/2019 07/02/2018  Decreased Interest 0 0 0 0 1  Down, Depressed, Hopeless 0 - 0 0 1  PHQ - 2 Score 0 0 0 0 2  Altered sleeping - - - - -  Tired, decreased energy - - - - -  Change in appetite - - - - -  Feeling bad or failure about yourself  - - - - -  Trouble concentrating - - - - -  Moving slowly or fidgety/restless - - - - -  Suicidal thoughts - - - - -  PHQ-9 Score - - - - -  Some recent data might be hidden       Assessment & Plan:    Patient Active Problem List   Diagnosis Date Noted  . Viral gastroenteritis 09/16/2018  . Healthcare maintenance 07/02/2018  . Infection of lip 07/28/2016  . Infected lip laceration 07/28/2016  . Alcohol dependence with acute alcoholic intoxication ([place]) 07/25/2016  . Major depressive disorder, recurrent severe without psychotic features ([place]) 07/25/2016  . MDD (major depressive disorder) 07/25/2016  . Atypical chest pain 10/06/2015  . Eczema 10/06/2015  . Alcoholism in remission ([place]) 08/16/2015  . HIV disease ([place]) 06/04/2014  . HZV (herpes zoster virus) post herpetic neuralgia 06/04/2014  . Rash and nonspecific skin eruption 05/11/2014  . Rib pain on left side 10/15/2013  . Abnormal Pap smear   . ASCUS (atypical squamous cells of undetermined significance) on Pap smear 07/24/2012  . Insomnia 12/20/2011  . Anemia 06/21/2011  . Gynecomastia 05/31/2011  . Zoster ophthalmicus 05/31/2011  . Headache(784.0) 02/15/2011  . Neuropathic pain 11/02/2010  . Cyst on ear 11/02/2010  .  ACUTE GASTRITIS WITHOUT MENTION OF HEMORRHAGE 12/29/2009  . CONSTIPATION 12/29/2009  . Alcohol dependence with alcohol-induced mood disorder ([place]) 06/07/2009  . HERPES ZOSTER 12/07/2008  . NEURALGIA, TRIGEMINAL 12/07/2008  . ANEMIA, MACROCYTIC, CHRONIC 11/23/2008  . POLYURIA 11/23/2008  . NOCTURIA 11/23/2008  . GYNECOMASTIA 09/21/2008  . CARBUNCLE AND FURUNCLE OF UNSPECIFIED SITE 06/19/2008  . Leukocytopenia 03/16/2008  . ROSACEA 07/22/2007  . EDEMA 07/22/2007  . PAROXYSMAL NOCTURNAL DYSPNEA 04/01/2007  . Pruritic disorder 01/24/2007  . DIARRHEA 01/24/2007  . Major depressive disorder with current active episode 12/10/2006  . Human immunodeficiency virus (HIV) disease ([place]) 01/13/2006  Problem List Items Addressed This Visit      Other   HIV disease ([place]) - Primary    Mr. Schoenberger has well-controlled HIV disease with good adherence and tolerance to his ART regimen of Triumeq.  No signs/symptoms of opportunistic infection or progressive HIV disease.  We reviewed previous lab work and discussed plan of care.  Continue current dose of Triumeq.  Plan for follow-up in 1 month or sooner if needed with lab work at that time.      MDD (major depressive disorder)    Mr. Mccluney depression is doing significantly better as he continues to focus internally.  No suicidal ideations or signs of psychosis.  He is working full-time at [employer] improvement which he says helps his mental status.  He will reschedule with our counselor.  Congratulated on his current progress.  Continue to monitor.      Healthcare maintenance     Influenza vaccination updated today.  Discussed importance of safe sexual practice to reduce risk of STI.  Condoms declined.  We will call to schedule appointment with dental clinic for routine dental care.          I am having Flonnie Hailstone maintain his amitriptyline and Triumeq.   Follow-up: Return in about 1 month (around 12/24/2019).   Terri Piedra, MSN,  FNP-C Nurse Practitioner [facility] for Infectious Disease [place] number: [phone]

## 2019-11-24 NOTE — Patient Instructions (Addendum)
Nice to see you.  Continue to take your Triumeq daily as prescribed.  Schedule an appointment with Marcie Bal for counseling.  Plan for follow up in 1 month or sooner if needed.   Have a great day and stay safe!

## 2019-11-24 NOTE — Assessment & Plan Note (Signed)
Arthur Lynch has well-controlled HIV disease with good adherence and tolerance to his ART regimen of Triumeq.  No signs/symptoms of opportunistic infection or progressive HIV disease.  We reviewed previous lab work and discussed plan of care.  Continue current dose of Triumeq.  Plan for follow-up in 1 month or sooner if needed with lab work at that time.

## 2019-12-02 ENCOUNTER — Ambulatory Visit: Payer: Self-pay

## 2019-12-02 ENCOUNTER — Other Ambulatory Visit: Payer: Self-pay

## 2019-12-02 NOTE — Progress Notes (Unsigned)
Mental Health Therapist Progress Note   Name: Davyn Morandi  Total time: 30  Type of Service: Individual Outpatient Mental Health Therapy  OBJECTIVE:  Mood: Euthymic and Affect: Appropriate Risk of harm to self or others: No plan to harm self or others  DIAGNOSIS:   GOALS ADDRESSED:  Patient will: 1.  Reduce symptoms of: stress  2.  Increase knowledge and/or ability of: coping skills  3.  Demonstrate ability to: Decrease self-medicating behaviors and Begin healthy grieving over losses  INTERVENTIONS: Interventions utilized:  Freight forwarder met with patient for outpatient mental health individual therapy to include ongoing assessment, support, and reinforcement. Therapist used rapport building techniques to introduce and discuss the therapeutic relationship. Therapist and client reviewed her CCA and her presenting problems, discussed treatment goals, and identified past barriers to treatment and to functionality. Therapist attempted to develop rapport via asking open-ended questions regarding client's psychosocial history.  EFFECTIVENESS/PLAN: Client was alert, oriented x3, with no SI, HI, or symptoms of psychosis (risk low).  Client was pleasant and friendly, engaging openly and appropriately with therapist, benefiting from supportive listening and exploration of feelings.  Next session recommended in one to two weeks.   Hughes Better, LCSW

## 2019-12-09 ENCOUNTER — Ambulatory Visit: Payer: Self-pay

## 2019-12-09 ENCOUNTER — Other Ambulatory Visit: Payer: Self-pay

## 2019-12-18 ENCOUNTER — Ambulatory Visit: Payer: Self-pay

## 2019-12-18 ENCOUNTER — Other Ambulatory Visit: Payer: Self-pay

## 2019-12-23 ENCOUNTER — Ambulatory Visit: Payer: Self-pay

## 2019-12-25 ENCOUNTER — Ambulatory Visit: Payer: Self-pay | Admitting: Family

## 2019-12-30 ENCOUNTER — Other Ambulatory Visit: Payer: Self-pay

## 2019-12-30 ENCOUNTER — Ambulatory Visit: Payer: Self-pay

## 2019-12-30 DIAGNOSIS — F331 Major depressive disorder, recurrent, moderate: Secondary | ICD-10-CM

## 2019-12-30 NOTE — Progress Notes (Signed)
Therapist met with client for OMH-IT to include ongoing assessment, support, and reinforcement.  Therapist allowed client to "check in" since previous session; asking client to share any positive coping skills they may have used over the previous week, along with any challenges faced.  Therapist provided supportive listening as client processed their thoughts, emotional responses, and behaviors surrounding several stressors.  Therapist used CBT to assist client in understanding how distorted thinking can cause psychological distress and contribute to mental health symptoms. Therapist asked client to consider reframing negative thought patterns as a way of changing the way he looks at something and, thus, changing his experience of it. Therapist asked client to work on identifying his own core beliefs, to be explored in next session.  Client was alert, oriented x3, with no SI, HI, or symptoms of psychosis (risk low).  Client was pleasant and friendly, engaging openly and appropriately with therapist, benefiting from supportive listening and exploration of feelings.  Next session recommended in one to two weeks.

## 2020-01-06 ENCOUNTER — Ambulatory Visit: Payer: Self-pay

## 2020-01-13 ENCOUNTER — Other Ambulatory Visit: Payer: Self-pay

## 2020-01-13 ENCOUNTER — Ambulatory Visit (INDEPENDENT_AMBULATORY_CARE_PROVIDER_SITE_OTHER): Payer: Self-pay | Admitting: Family

## 2020-01-13 ENCOUNTER — Encounter: Payer: Self-pay | Admitting: Family

## 2020-01-13 ENCOUNTER — Ambulatory Visit: Payer: Self-pay

## 2020-01-13 VITALS — BP 125/87 | HR 109 | Temp 97.7°F | Ht 74.0 in | Wt 139.0 lb

## 2020-01-13 DIAGNOSIS — Z0000 Encounter for general adult medical examination without abnormal findings: Secondary | ICD-10-CM

## 2020-01-13 DIAGNOSIS — B20 Human immunodeficiency virus [HIV] disease: Secondary | ICD-10-CM

## 2020-01-13 DIAGNOSIS — Z113 Encounter for screening for infections with a predominantly sexual mode of transmission: Secondary | ICD-10-CM

## 2020-01-13 DIAGNOSIS — Z79899 Other long term (current) drug therapy: Secondary | ICD-10-CM

## 2020-01-13 DIAGNOSIS — F1021 Alcohol dependence, in remission: Secondary | ICD-10-CM

## 2020-01-13 MED ORDER — TRIUMEQ 600-50-300 MG PO TABS: 1.0000 | ORAL_TABLET | Freq: Every day | ORAL | 5 refills | Status: DC

## 2020-01-13 NOTE — Assessment & Plan Note (Signed)
Mr. Leandro continues to have well-controlled HIV disease with good adherence and tolerance to his ART regimen Triumeq.  No signs/symptoms of opportunistic infection or progressive HIV disease.  We reviewed his previous lab work and discussed plan of care.  Continue current dose of Triumeq.  Check blood work today.  Plan for follow-up in 3 months or sooner if needed with lab work 1 to 2 weeks prior to appointment or on same day.

## 2020-01-13 NOTE — Patient Instructions (Addendum)
Nice to see you.  We will check your blood work today.   Continue to take your Triumeq as prescribed.  Refills have been sent to the pharmacy.  Continue to follow up with Marcie Bal and work with [employer].  Plan for follow up in 3 months or sooner if needed with lab work on the same day.

## 2020-01-13 NOTE — Assessment & Plan Note (Signed)
Continues to remain in remission. In counseling and appears to be doing well. Starting new job at [employer].

## 2020-01-13 NOTE — Progress Notes (Signed)
Subjective:    Patient ID: Arthur Lynch, male    DOB: 1960/05/08, 59 y.o.   MRN: [id number]  Chief Complaint  Patient presents with  . Follow-up    declined condoms      HPI:  Arthur Lynch is a 59 y.o. male with HIV who was last seen in the office on 11/24/2019 disease with well-controlled virus with a viral load that was undetectable and CD4 count of 489.  No recent blood work completed.  Here today for routine follow-up.   Arthur Lynch continues to take his Triumeq daily as prescribed with no adverse side effects or missed doses since his last office visit.  Overall feeling well today with no new concerns/complaints.  He is eager to continue counseling as well. Denies fevers, chills, night sweats, headaches, changes in vision, neck pain/stiffness, nausea, diarrhea, vomiting, lesions or rashes.  [name] has no problems obtaining his medication from the pharmacy and remains covered through [employer].  Feelings of being down, depressed, or hopeless have improved since he started counseling and he is feeling better.  No recreational or illicit drug use, tobacco use, or alcohol consumption.  He is getting ready to start a new job at [employer].  Declines condoms.  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  Declines influenza vaccine today.  Due for routine dental care.    Allergies  Allergen Reactions  . Penicillins Itching    Has patient had a PCN reaction causing immediate rash, facial/tongue/throat swelling, SOB or lightheadedness with hypotension: No Has patient had a PCN reaction causing severe rash involving mucus membranes or skin necrosis: No Has patient had a PCN reaction that required hospitalization No Has patient had a PCN reaction occurring within the last 10 years: No If all of the above answers are "NO", then may proceed with Cephalosporin use.       Outpatient Medications Prior to Visit  Medication Sig Dispense Refill  . amitriptyline (ELAVIL) 50 MG  tablet Take 1 tablet (50 mg total) by mouth at bedtime. 30 tablet 5  . chlorhexidine (PERIDEX) 0.12 % solution SMARTSIG:By Mouth    . clindamycin (CLEOCIN) 300 MG capsule Take 300 mg by mouth 4 (four) times daily.    Marland Kitchen abacavir-dolutegravir-lamiVUDine (TRIUMEQ) 600-50-300 MG tablet Take 1 tablet by mouth daily. 30 tablet 2   No facility-administered medications prior to visit.     Past Medical History:  Diagnosis Date  . Abnormal Pap smear   . Alcoholism in remission ([place]) 08/16/2015  . Anemia   . Atypical chest pain 10/06/2015  . Cancer ([place])   . COPD (chronic obstructive pulmonary disease) ([place])   . Depression   . Eczema 10/06/2015  . HIV (human immunodeficiency virus infection) ([place])   . Murmur, heart   . Neuromuscular disorder ([place])   . Substance abuse [facility])      Past Surgical History:  Procedure Laterality Date  . ANOSCOPY         Review of Systems  Constitutional: Negative for appetite change, chills, fatigue, fever and unexpected weight change.  Eyes: Negative for visual disturbance.  Respiratory: Negative for cough, chest tightness, shortness of breath and wheezing.   Cardiovascular: Negative for chest pain and leg swelling.  Gastrointestinal: Negative for abdominal pain, constipation, diarrhea, nausea and vomiting.  Genitourinary: Negative for dysuria, flank pain, frequency, genital sores, hematuria and urgency.  Skin: Negative for rash.  Allergic/Immunologic: Negative for immunocompromised state.  Neurological: Negative for dizziness and headaches.  Objective:    BP 125/87   Pulse (!) 109   Temp 97.7 F (36.5 C) (Oral)   Ht 6\' 2"  (1.88 m)   Wt 139 lb (63 kg)   SpO2 100%   BMI 17.85 kg/m  Nursing note and vital signs reviewed.  Physical Exam Constitutional:      General: He is not in acute distress.    Appearance: He is well-developed.  Eyes:     Conjunctiva/sclera: Conjunctivae normal.  Cardiovascular:     Rate and Rhythm: Normal rate and  regular rhythm.     Heart sounds: Normal heart sounds. No murmur heard.  No friction rub. No gallop.   Pulmonary:     Effort: Pulmonary effort is normal. No respiratory distress.     Breath sounds: Normal breath sounds. No wheezing or rales.  Chest:     Chest wall: No tenderness.  Abdominal:     General: Bowel sounds are normal.     Palpations: Abdomen is soft.     Tenderness: There is no abdominal tenderness.  Musculoskeletal:     Cervical back: Neck supple.  Lymphadenopathy:     Cervical: No cervical adenopathy.  Skin:    General: Skin is warm and dry.     Findings: No rash.  Neurological:     Mental Status: He is alert and oriented to person, place, and time.  Psychiatric:        Behavior: Behavior normal.        Thought Content: Thought content normal.        Judgment: Judgment normal.      Depression screen [facility] 2/9 01/13/2020 11/24/2019 10/24/2019 10/24/2019 08/08/2019  Decreased Interest 0 0 0 0 0  Down, Depressed, Hopeless 0 0 - 0 0  PHQ - 2 Score 0 0 0 0 0  Altered sleeping - - - - -  Tired, decreased energy - - - - -  Change in appetite - - - - -  Feeling bad or failure about yourself  - - - - -  Trouble concentrating - - - - -  Moving slowly or fidgety/restless - - - - -  Suicidal thoughts - - - - -  PHQ-9 Score - - - - -  Some recent data might be hidden       Assessment & Plan:    Patient Active Problem List   Diagnosis Date Noted  . Viral gastroenteritis 09/16/2018  . Healthcare maintenance 07/02/2018  . Infection of lip 07/28/2016  . Infected lip laceration 07/28/2016  . Alcohol dependence with acute alcoholic intoxication ([place]) 07/25/2016  . Major depressive disorder, recurrent severe without psychotic features ([place]) 07/25/2016  . MDD (major depressive disorder) 07/25/2016  . Atypical chest pain 10/06/2015  . Eczema 10/06/2015  . Alcoholism in remission ([place]) 08/16/2015  . HIV disease ([place]) 06/04/2014  . HZV (herpes zoster virus) post herpetic  neuralgia 06/04/2014  . Rash and nonspecific skin eruption 05/11/2014  . Rib pain on left side 10/15/2013  . Abnormal Pap smear   . ASCUS (atypical squamous cells of undetermined significance) on Pap smear 07/24/2012  . Insomnia 12/20/2011  . Anemia 06/21/2011  . Gynecomastia 05/31/2011  . Zoster ophthalmicus 05/31/2011  . Headache(784.0) 02/15/2011  . Neuropathic pain 11/02/2010  . Cyst on ear 11/02/2010  . ACUTE GASTRITIS WITHOUT MENTION OF HEMORRHAGE 12/29/2009  . CONSTIPATION 12/29/2009  . Alcohol dependence with alcohol-induced mood disorder ([place]) 06/07/2009  . HERPES ZOSTER 12/07/2008  . NEURALGIA, TRIGEMINAL 12/07/2008  .  ANEMIA, MACROCYTIC, CHRONIC 11/23/2008  . POLYURIA 11/23/2008  . NOCTURIA 11/23/2008  . GYNECOMASTIA 09/21/2008  . CARBUNCLE AND FURUNCLE OF UNSPECIFIED SITE 06/19/2008  . Leukocytopenia 03/16/2008  . ROSACEA 07/22/2007  . EDEMA 07/22/2007  . PAROXYSMAL NOCTURNAL DYSPNEA 04/01/2007  . Pruritic disorder 01/24/2007  . DIARRHEA 01/24/2007  . Major depressive disorder with current active episode 12/10/2006  . Human immunodeficiency virus (HIV) disease ([place]) 01/13/2006     Problem List Items Addressed This Visit      Other   Human immunodeficiency virus (HIV) disease ([place])    Mr. Bouknight continues to have well-controlled HIV disease with good adherence and tolerance to his ART regimen Triumeq.  No signs/symptoms of opportunistic infection or progressive HIV disease.  We reviewed his previous lab work and discussed plan of care.  Continue current dose of Triumeq.  Check blood work today.  Plan for follow-up in 3 months or sooner if needed with lab work 1 to 2 weeks prior to appointment or on same day.      Relevant Medications   clindamycin (CLEOCIN) 300 MG capsule   abacavir-dolutegravir-lamiVUDine (TRIUMEQ) 600-50-300 MG tablet   HIV disease (HCC)   Relevant Medications   clindamycin (CLEOCIN) 300 MG capsule   abacavir-dolutegravir-lamiVUDine (TRIUMEQ)  600-50-300 MG tablet   Other Relevant Orders   COMPLETE METABOLIC PANEL WITH GFR   HIV-1 RNA quant-no reflex-bld   T-helper cell (CD4)- ([facility] clinic only)   Alcoholism in remission ([place])    Continues to remain in remission. In counseling and appears to be doing well. Starting new job at [employer].       Healthcare maintenance - Primary     Declines influenza and Covid vaccines.  Discussed importance of safe sexual practice to reduce risk of STI.  Condoms declined.  Information for scheduling routine dental care provided.       Other Visit Diagnoses    Pharmacologic therapy       Screening for STDs (sexually transmitted diseases)       Relevant Orders   RPR       I am having Flonnie Hailstone maintain his amitriptyline, chlorhexidine, clindamycin, and Triumeq.   Meds ordered this encounter  Medications  . abacavir-dolutegravir-lamiVUDine (TRIUMEQ) 600-50-300 MG tablet    Sig: Take 1 tablet by mouth daily.    Dispense:  30 tablet    Refill:  5    Order Specific Question:   Supervising Provider    Answer:   Carlyle Basques [[id number]]     Follow-up: Return in about 3 months (around 04/14/2020), or if symptoms worsen or fail to improve.   Terri Piedra, MSN, FNP-C Nurse Practitioner [facility] for Infectious Disease [place] number: [phone]

## 2020-01-13 NOTE — Assessment & Plan Note (Signed)
   Declines influenza and Covid vaccines.  Discussed importance of safe sexual practice to reduce risk of STI.  Condoms declined.  Information for scheduling routine dental care provided.

## 2020-01-14 LAB — T-HELPER CELL (CD4) - (RCID CLINIC ONLY)
CD4 % Helper T Cell: 22 % — ABNORMAL LOW (ref 33–65)
CD4 T Cell Abs: 511 /uL (ref 400–1790)

## 2020-01-16 ENCOUNTER — Ambulatory Visit: Payer: Self-pay

## 2020-01-16 LAB — COMPLETE METABOLIC PANEL WITH GFR
AG Ratio: 1.1 (calc) (ref 1.0–2.5)
ALT: 13 U/L (ref 9–46)
AST: 31 U/L (ref 10–35)
Albumin: 4.1 g/dL (ref 3.6–5.1)
Alkaline phosphatase (APISO): 77 U/L (ref 35–144)
BUN: 16 mg/dL (ref 7–25)
CO2: 28 mmol/L (ref 20–32)
Calcium: 10.1 mg/dL (ref 8.6–10.3)
Chloride: 101 mmol/L (ref 98–110)
Creat: 1.22 mg/dL (ref 0.70–1.33)
GFR, Est African American: 76 mL/min/{1.73_m2} (ref >=60)
GFR, Est Non African American: 65 mL/min/{1.73_m2} (ref >=60)
Globulin: 3.7 g/dL (calc) (ref 1.9–3.7)
Glucose, Bld: 98 mg/dL (ref 65–99)
Potassium: 4.4 mmol/L (ref 3.5–5.3)
Sodium: 136 mmol/L (ref 135–146)
Total Bilirubin: 0.5 mg/dL (ref 0.2–1.2)
Total Protein: 7.8 g/dL (ref 6.1–8.1)

## 2020-01-16 LAB — HIV-1 RNA QUANT-NO REFLEX-BLD
HIV 1 RNA Quant: <20 Copies/mL
HIV-1 RNA Quant, Log: <1.3 Log cps/mL

## 2020-01-16 LAB — RPR: RPR Ser Ql: NONREACTIVE

## 2020-01-22 ENCOUNTER — Ambulatory Visit: Payer: Self-pay

## 2020-01-22 ENCOUNTER — Other Ambulatory Visit: Payer: Self-pay

## 2020-01-22 NOTE — Progress Notes (Unsigned)
Mental Health Therapist Progress Note   Name: Mattthew Ziomek  Total time: 60  Type of Service: Individual Outpatient Mental Health Therapy  OBJECTIVE:  Mood: Anxious and Irritable and Affect: Appropriate Risk of harm to self or others: No plan to harm self or others  DIAGNOSIS:   GOALS ADDRESSED:  Patient will: 1.  Reduce symptoms of: depression  2.  Increase knowledge and/or ability of: coping skills  3.  Demonstrate ability to: Increase healthy adjustment to current life circumstances  INTERVENTIONS: Interventions utilized:  Supportive Counseling Therapist met with patient for outpatient mental health individual therapy to include ongoing assessment, support, and reinforcement.  Therapist allowed patient to "check in" since previous session; asking patient to share any positive coping skills they may have used over the previous week, along with any challenges faced.  Therapist provided supportive listening as patient processed their thoughts, emotional responses, and behaviors surrounding several stressors.  EFFECTIVENESS/PLAN: Client was alert, oriented x3, with no SI, HI, or symptoms of psychosis (risk low).  Client was pleasant and friendly, engaging openly and appropriately with therapist, benefiting from supportive listening and exploration of feelings.  Next session recommended in one to two weeks.   Hughes Better, LCSW

## 2020-01-27 ENCOUNTER — Ambulatory Visit: Payer: Self-pay

## 2020-02-10 ENCOUNTER — Ambulatory Visit: Payer: Self-pay

## 2020-02-13 ENCOUNTER — Ambulatory Visit: Payer: Self-pay

## 2020-02-19 ENCOUNTER — Emergency Department (HOSPITAL_COMMUNITY)
Admission: EM | Admit: 2020-02-19 | Discharge: 2020-02-19 | Disposition: A | Discharge status: Home / Self Care | Payer: Self-pay | Attending: Emergency Medicine | Admitting: Emergency Medicine

## 2020-02-19 ENCOUNTER — Emergency Department (HOSPITAL_COMMUNITY): Payer: Self-pay

## 2020-02-19 DIAGNOSIS — B20 Human immunodeficiency virus [HIV] disease: Secondary | ICD-10-CM | POA: Insufficient documentation

## 2020-02-19 DIAGNOSIS — S00212A Abrasion of left eyelid and periocular area, initial encounter: Secondary | ICD-10-CM | POA: Insufficient documentation

## 2020-02-19 DIAGNOSIS — Z79899 Other long term (current) drug therapy: Secondary | ICD-10-CM | POA: Insufficient documentation

## 2020-02-19 DIAGNOSIS — X31XXXA Exposure to excessive natural cold, initial encounter: Secondary | ICD-10-CM | POA: Insufficient documentation

## 2020-02-19 DIAGNOSIS — T68XXXA Hypothermia, initial encounter: Secondary | ICD-10-CM | POA: Insufficient documentation

## 2020-02-19 DIAGNOSIS — Z20822 Contact with and (suspected) exposure to covid-19: Secondary | ICD-10-CM | POA: Insufficient documentation

## 2020-02-19 DIAGNOSIS — F10929 Alcohol use, unspecified with intoxication, unspecified: Secondary | ICD-10-CM | POA: Insufficient documentation

## 2020-02-19 DIAGNOSIS — J449 Chronic obstructive pulmonary disease, unspecified: Secondary | ICD-10-CM | POA: Insufficient documentation

## 2020-02-19 DIAGNOSIS — F10920 Alcohol use, unspecified with intoxication, uncomplicated: Secondary | ICD-10-CM

## 2020-02-19 LAB — RAPID URINE DRUG SCREEN, HOSP PERFORMED
Amphetamines: NOT DETECTED
Barbiturates: NOT DETECTED
Benzodiazepines: NOT DETECTED
Cocaine: NOT DETECTED
Opiates: NOT DETECTED
Tetrahydrocannabinol: NOT DETECTED

## 2020-02-19 LAB — CBC WITH DIFFERENTIAL/PLATELET
Abs Immature Granulocytes: 0.07 10*3/uL (ref 0.00–0.07)
Basophils Absolute: 0 10*3/uL (ref 0.0–0.1)
Basophils Relative: 0 %
Eosinophils Absolute: 0 10*3/uL (ref 0.0–0.5)
Eosinophils Relative: 0 %
HCT: 35.9 % — ABNORMAL LOW (ref 39.0–52.0)
Hemoglobin: 12.1 g/dL — ABNORMAL LOW (ref 13.0–17.0)
Immature Granulocytes: 1 %
Lymphocytes Relative: 13 %
Lymphs Abs: 1.1 10*3/uL (ref 0.7–4.0)
MCH: 36.7 pg — ABNORMAL HIGH (ref 26.0–34.0)
MCHC: 33.7 g/dL (ref 30.0–36.0)
MCV: 108.8 fL — ABNORMAL HIGH (ref 80.0–100.0)
Monocytes Absolute: 0.5 10*3/uL (ref 0.1–1.0)
Monocytes Relative: 6 %
Neutro Abs: 6.5 10*3/uL (ref 1.7–7.7)
Neutrophils Relative %: 80 %
Platelets: 207 10*3/uL (ref 150–400)
RBC: 3.3 MIL/uL — ABNORMAL LOW (ref 4.22–5.81)
RDW: 12.9 % (ref 11.5–15.5)
WBC: 8.2 10*3/uL (ref 4.0–10.5)
nRBC: 0 % (ref 0.0–0.2)

## 2020-02-19 LAB — BASIC METABOLIC PANEL
Anion gap: 16 — ABNORMAL HIGH (ref 5–15)
BUN: 16 mg/dL (ref 6–20)
CO2: 21 mmol/L — ABNORMAL LOW (ref 22–32)
Calcium: 8.6 mg/dL — ABNORMAL LOW (ref 8.9–10.3)
Chloride: 104 mmol/L (ref 98–111)
Creatinine, Ser: 1.01 mg/dL (ref 0.61–1.24)
GFR, Estimated: >60 mL/min (ref >60)
Glucose, Bld: 162 mg/dL — ABNORMAL HIGH (ref 70–99)
Potassium: 3.4 mmol/L — ABNORMAL LOW (ref 3.5–5.1)
Sodium: 141 mmol/L (ref 135–145)

## 2020-02-19 LAB — URINALYSIS, ROUTINE W REFLEX MICROSCOPIC
Bacteria, UA: NONE SEEN
Bilirubin Urine: NEGATIVE
Glucose, UA: NEGATIVE mg/dL
Hgb urine dipstick: NEGATIVE
Ketones, ur: NEGATIVE mg/dL
Leukocytes,Ua: NEGATIVE
Nitrite: NEGATIVE
Protein, ur: NEGATIVE mg/dL
Specific Gravity, Urine: 1.008 (ref 1.005–1.030)
pH: 5 (ref 5.0–8.0)

## 2020-02-19 LAB — HEPATIC FUNCTION PANEL
ALT: 40 U/L (ref 0–44)
AST: 119 U/L — ABNORMAL HIGH (ref 15–41)
Albumin: 3.4 g/dL — ABNORMAL LOW (ref 3.5–5.0)
Alkaline Phosphatase: 77 U/L (ref 38–126)
Bilirubin, Direct: 0.1 mg/dL (ref 0.0–0.2)
Indirect Bilirubin: 0.5 mg/dL (ref 0.3–0.9)
Total Bilirubin: 0.6 mg/dL (ref 0.3–1.2)
Total Protein: 7.5 g/dL (ref 6.5–8.1)

## 2020-02-19 LAB — ETHANOL: Alcohol, Ethyl (B): 264 mg/dL — ABNORMAL HIGH (ref <10)

## 2020-02-19 LAB — MAGNESIUM: Magnesium: 2.2 mg/dL (ref 1.7–2.4)

## 2020-02-19 LAB — RESP PANEL BY RT-PCR (FLU A&B, COVID) ARPGX2
Influenza A by PCR: NEGATIVE
Influenza B by PCR: NEGATIVE
SARS Coronavirus 2 by RT PCR: NEGATIVE

## 2020-02-19 LAB — LIPASE, BLOOD: Lipase: 17 U/L (ref 11–51)

## 2020-02-19 LAB — TROPONIN I (HIGH SENSITIVITY): Troponin I (High Sensitivity): 4 ng/L (ref <18)

## 2020-02-19 MED ORDER — SODIUM CHLORIDE 0.9 % IV BOLUS
1000.0000 mL | Freq: Once | INTRAVENOUS | Status: AC
  Administered 2020-02-19: 1000 mL via INTRAVENOUS

## 2020-02-19 NOTE — ED Triage Notes (Signed)
Pt to ED via EMS from bus stop, unknown how long he has been outside. Per bystanders airplane alcohol bottles noted near patient. Small lac left eye brown.  A&O X 1 (SELF). Last VS 100/66, 60 P, 158 CBG. Temp 92.3  #18 LAC. No medications given by EMS, Unknown medical history.

## 2020-02-19 NOTE — Discharge Instructions (Addendum)
You were seen and treated in the ER for hypothermia, low body temperature, after being found sleeping outside in the cold.  Your alcohol level is also very high.  This was a very dangerous situation, and he could have died from this exposure.  Fortunately were able to rewarm you in the hospital.  Your blood work all looked reassuring today.  Your CT scans did not show any sign of brain bleeding or fracture in your skull or neck.  We gave you some warm IV fluids, and I feel your sober enough for discharge.  I gave you a list of resources that you can look into for helping with your drinking.  Our peer support group may also reach out to you by phone over the next few days.
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
## 2020-03-30 ENCOUNTER — Ambulatory Visit (HOSPITAL_COMMUNITY)
Admission: RE | Admit: 2020-03-30 | Discharge: 2020-03-30 | Disposition: A | Discharge status: Home / Self Care | Payer: Self-pay | Attending: Psychiatry | Admitting: Psychiatry

## 2020-03-30 DIAGNOSIS — F10229 Alcohol dependence with intoxication, unspecified: Secondary | ICD-10-CM | POA: Insufficient documentation

## 2020-03-30 NOTE — H&P (Signed)
Behavioral Health Medical Screening Exam  Arthur Lynch is an 60 y.o. male. He is tearful on assessment and requesting detox from alcohol. He reports drinking daily 1 pint to a fifth of liquor. He has been drinking everyday before going into his job at [employer] and quit his job last week as a result. 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 Denies any plan/intent to see those former coworkers again. He strongly denies SI/HI. He denies AVH.   Total Time spent with patient: 30 minutes  Psychiatric Specialty Exam: Physical Exam Vitals reviewed.  Constitutional:      Appearance: He is well-developed and well-nourished.  HENT:     Head: Normocephalic.  Cardiovascular:     Rate and Rhythm: Regular rhythm. Tachycardia present.     Pulses: Normal pulses.     Heart sounds: Normal heart sounds.  Pulmonary:     Effort: Pulmonary effort is normal.  Musculoskeletal:        General: Normal range of motion.  Skin:    General: Skin is warm and dry.  Neurological:     Mental Status: He is alert and oriented to person, place, and time.    Review of Systems  Constitutional: Negative.   HENT: Positive for rhinorrhea (reports negative COVID test).   Respiratory: Positive for cough (reports negative COVID test). Negative for shortness of breath.   Gastrointestinal: Negative for diarrhea, nausea and vomiting.  Neurological: Negative for dizziness, tremors, seizures and headaches.  Psychiatric/Behavioral: Positive for dysphoric mood. Negative for agitation, behavioral problems, confusion, decreased concentration, hallucinations, self-injury and suicidal ideas. The patient is not nervous/anxious and is not hyperactive.    Blood pressure (!) 120/98, pulse (!) 120, temperature 98.1 F (36.7 C), temperature source Oral, resp. rate 18, SpO2 98 %.There is no height or weight on file to calculate BMI. General  Appearance: Casual Eye Contact:  Good Speech:  Normal Rate Volume:  Normal Mood:  Depressed Affect:  Congruent and Tearful Thought Process:  Coherent and Goal Directed Orientation:  Full (Time, Place, and Person) Thought Content:  Logical Suicidal Thoughts:  No Homicidal Thoughts:  No Memory:  Immediate;   Good Recent;   Good Remote;   Good Judgement:  Intact Insight:  Fair Psychomotor Activity:  Normal Concentration: Concentration: Good and Attention Span: Good Recall:  Good Fund of Knowledge:Fair Language: Good Akathisia:  No Handed:  Right AIMS (if indicated):    Assets:  Communication Skills Desire for Improvement Housing Leisure Time Social Support Sleep:     Musculoskeletal: Strength & Muscle Tone: within normal limits Gait & Station: normal Patient leans: N/A  Blood pressure (!) 120/98, pulse (!) 120, temperature 98.1 F (36.7 C), temperature source Oral, resp. rate 18, SpO2 98 %.  Recommendations: Based on my evaluation the patient does not appear to have an emergency medical condition.  Substance use treatment referrals.  Connye Burkitt, NP 03/30/2020, 3:09 PM

## 2020-03-30 NOTE — BH Assessment (Signed)
Comprehensive Clinical Assessment (CCA) Note  03/30/2020 Antoni Stefan [id number]   Disposition:per Harriett Sine , NP Based on my evaluation the patient does not appear to have an emergency medical condition.  Substance use treatment referrals  Pt is a 60 yr old male who presents voluntarily to [facility] via car. Pt was accompanied by himself but was dropped off by minister friend of the patient  reporting symptoms of depression with alcohol abuse . Pt has a history of depression and substance abuse.  and says he was referred for assessment by himself . Pt reports medication Trimeg  for HIV and Amitriptyline for depression pt.is compliant .Pt denies  current suicidal ideation with no plans to self harm . Denies past attempts.  Pt acknowledges multiple symptoms of Depression, including anhedonia, isolating, feelings of worthlessness & guilt, tearfulness, changes in sleep & appetite, & increased irritability. Pt denies homicidal ideation/ history of violence today but reports having passive feelings of HI towards co workers who constantly bother or talk about him. Pt denied any plans to carry out  HI against former co workers. Pt denies auditory & visual hallucinations or other symptoms of psychosis. Pt states current stressors include his depressive state and his alcohol abuse.   Pt lives with his cousin , and supports include family. Pt denies a hx of abuse and trauma. Pt did not disclose  is a family history of mental health or substance use.  Pt's work history did include [employer] but patient recently quit do to [place] and verbally  abusive staff. Pt has good insight and judgment. Pt's memory is coherent and intact. Pt denies legal history.  Protective factors against suicide include good family support, no current suicidal ideation, future orientation, therapeutic relationship, no access to firearms, no current psychotic symptoms and no prior attempts.  Pt's OP history includes  [facility] for 6-8  months of SAIOP.  IP history includes [facility] for 2 weeks . Last admission was  over 8 years ago at [facility]  Pt reports  alcohol/ substance abuse.Pt drinks a pint to 1/5th of liquor daily because he depressed.Patient states he needs closure from this depression or he will continue to drink.   MSE: Pt is casually dressed, alert, oriented x4 with normal speech and normal motor behavior. Eye contact is good. Pt's mood is depressed and affect is depressed and tearful. Affect is congruent with mood. Thought process is coherent and relevant. There is no indication Pt is currently responding to internal stimuli or experiencing delusional thought content. Pt was cooperative throughout assessment.  Disposition:per Harriett Sine , NP Based on my evaluation the patient does not appear to have an emergency medical condition.  Substance use treatment referrals.    Chief Complaint:  Chief Complaint  Patient presents with  . Psychiatric Evaluation   Visit Diagnosis:   Alcohol dependence with acute alcoholic intoxication ([place])     CCA Screening, Triage and Referral (STR)  Patient Reported Information How did you hear about [place]? -- (Previous patient)  Referral name: No data recorded Referral phone number: No data recorded  Whom do you see for routine medical problems? No data recorded Practice/Facility Name: No data recorded Practice/Facility Phone Number: No data recorded Name of Contact: No data recorded Contact Number: No data recorded Contact Fax Number: No data recorded Prescriber Name: No data recorded Prescriber Address (if known): No data recorded  What Is the Reason for Your Visit/Call Today? detox  How Long Has This Been Causing You Problems? > than  6 months  What Do You Feel Would Help You the Most Today? Other (Comment) (inpatient)   Have You Recently Been in Any Inpatient Treatment (Hospital/Detox/Crisis Center/28-Day Program)? No  Name/Location of Program/Hospital:No data  recorded How Long Were You There? No data recorded When Were You Discharged? No data recorded  Have You Ever Received Services From [facility] Before? Yes  Who Do You See at [facility]? No data recorded  Have You Recently Had Any Thoughts About Hurting Yourself? No  Are You Planning to Commit Suicide/Harm Yourself At This time? No   Have you Recently Had Thoughts About [place]? Yes  Explanation: No data recorded  Have You Used Any Alcohol or Drugs in the Past 24 Hours? Yes  How Long Ago Did You Use Drugs or Alcohol? 0600  What Did You Use and How Much? 1/5 th   Do You Currently Have a Therapist/Psychiatrist? No  Name of Therapist/Psychiatrist: No data recorded  Have You Been Recently Discharged From Any Office Practice or Programs? No  Explanation of Discharge From Practice/Program: No data recorded    CCA Screening Triage Referral Assessment Type of Contact: Face-to-Face  Is this Initial or Reassessment? No data recorded Date Telepsych consult ordered in [facility]:  No data recorded Time Telepsych consult ordered in [facility]:  No data recorded  Patient Reported Information Reviewed? Yes  Patient Left Without Being Seen? No data recorded Reason for Not Completing Assessment: No data recorded  Collateral Involvement: No data recorded  Does Patient Have a [place]? No data recorded Name and Contact of Legal Guardian: No data recorded If Minor and Not Living with Parent(s), Who has Custody? No data recorded Is CPS involved or ever been involved? Never  Is APS involved or ever been involved? Never   Patient Determined To Be At Risk for Harm To Self or Others Based on Review of Patient Reported Information or Presenting Complaint? No  Method: No data recorded Availability of Means: No data recorded Intent: No data recorded Notification Required: No data recorded Additional Information for Danger to Others Potential: No data  recorded Additional Comments for Danger to Others Potential: No data recorded Are There Guns or Other Weapons in Your Home? No data recorded Types of Guns/Weapons: No data recorded Are These Weapons Safely Secured?                            No data recorded Who Could Verify You Are Able To Have These Secured: No data recorded Do You Have any Outstanding Charges, Pending Court Dates, Parole/Probation? No data recorded Contacted To Inform of Risk of Harm To Self or Others: No data recorded  [facility] of Assessment: -- [facility])   Does Patient Present under Involuntary Commitment? No  IVC Papers Initial File Date: No data recorded  [place] of Residence: [place]   Patient Currently Receiving the Following Services: Not Receiving Services   Determination of Need: Urgent (48 hours)   Options For Referral: Other: Comment (Detox)     CCA Biopsychosocial Intake/Chief Complaint:  ETOH detox  Current Symptoms/Problems: crying , execessive drinking , feelings of giving up   Patient Reported Schizophrenia/Schizoaffective Diagnosis in Past: No   Strengths: No data recorded Preferences: No data recorded Abilities: No data recorded  Type of Services Patient Feels are Needed: inpatient   Initial Clinical Notes/Concerns: No data recorded  Mental Health Symptoms Depression:  Hopelessness; Increase/decrease in appetite; Tearfulness; Irritability; Worthlessness  Duration of Depressive symptoms: Greater than two weeks   Mania:  N/A   Anxiety:   Worrying   Psychosis:  None   Duration of Psychotic symptoms: No data recorded  Trauma:  None   Obsessions:  None   Compulsions:  None   Inattention:  None   Hyperactivity/Impulsivity:  N/A   Oppositional/Defiant Behaviors:  N/A   Emotional Irregularity:  Chronic feelings of emptiness   Other Mood/Personality Symptoms:  No data recorded   Mental Status Exam Appearance and self-care  Stature:  Tall   Weight:  Thin    Clothing:  Casual   Grooming:  Normal   Cosmetic use:  None   Posture/gait:  Normal   Motor activity:  Not Remarkable   Sensorium  Attention:  Normal   Concentration:  Normal   Orientation:  X5   Recall/memory:  Normal   Affect and Mood  Affect:  Depressed; Tearful   Mood:  Depressed   Relating  Eye contact:  Normal   Facial expression:  Depressed; Sad   Attitude toward examiner:  Cooperative   Thought and Language  Speech flow: Clear and Coherent   Thought content:  Appropriate to Mood and Circumstances   Preoccupation:  None   Hallucinations:  None   Organization:  No data recorded  [employer] of Knowledge:  Good   Intelligence:  Average   Abstraction:  Normal   Judgement:  Good   Reality Testing:  Realistic   Insight:  Good   Decision Making:  Normal   Social Functioning  Social Maturity:  Isolates   Social Judgement:  Normal   Stress  Stressors:  Work; Other (Comment) (drinking)   Coping Ability:  Programme researcher, broadcasting/film/video Deficits:  Self-control   Supports:  Family     Religion: Religion/Spirituality Are You A Religious Person?: No  Leisure/Recreation: Leisure / Recreation Do You Have Hobbies?: No  Exercise/Diet: Exercise/Diet Do You Exercise?: No Do You Follow a Special Diet?: No Do You Have Any Trouble Sleeping?: No   CCA Employment/Education Employment/Work Situation: Employment / Work Situation Employment situation: Unemployed Patient's job has been impacted by current illness: Yes 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 Has patient ever been in the [employer]?: No  Education: Education Is Patient Currently Attending School?: No Did Teacher, adult education From [employer]?: Yes Did Physicist, medical?: No Did Heritage manager?: No Did You Have An  Individualized Education Program (IIEP): No Did You Have Any Difficulty At [employer]?: No Patient's Education Has Been Impacted by Current Illness: No   CCA Family/Childhood History Family and Relationship History:    Childhood History:  Childhood History By whom was/is the patient raised?: Both parents Additional childhood history information: unknown Description of patient's relationship with caregiver when they were a child: unknown Patient's description of current relationship with people who raised him/her: unknown How were you disciplined when you got in trouble as a child/adolescent?: unknown Does patient have siblings?: Yes Number of Siblings: 3 Description of patient's current relationship with siblings: good Did patient suffer any verbal/emotional/physical/sexual abuse as a child?: No Did patient suffer from severe childhood neglect?: No Has patient ever been sexually abused/assaulted/raped as an adolescent or adult?: No Was the patient ever a victim of a crime or a disaster?: No Witnessed domestic violence?: No Has patient been  affected by domestic violence as an adult?: No  Child/Adolescent Assessment:     CCA Substance Use Alcohol/Drug Use: Alcohol / Drug Use Pain Medications: SEE MAR Prescriptions: SEE MAR Over the Counter: SEE MAR History of alcohol / drug use?: Yes Longest period of sobriety (when/how long): 2 YEARS Negative Consequences of Use: Work / School Withdrawal Symptoms: Irritability Substance #1 Name of Substance 1: ETOH 1 - Amount (size/oz): PINT TO 1/5 DAILY OF LIQUOR 1 - Frequency: DAILY 1 - Duration: MANY YEARS 1 - Last Use / Amount: 6 AM THIS MORNING                       ASAM's:  Six Dimensions of Multidimensional Assessment  Dimension 1:  Acute Intoxication and/or Withdrawal Potential:      Dimension 2:  Biomedical Conditions and Complications:      Dimension 3:  Emotional, Behavioral, or Cognitive Conditions and  Complications:     Dimension 4:  Readiness to Change:     Dimension 5:  Relapse, Continued use, or Continued Problem Potential:     Dimension 6:  Recovery/Living Environment:     ASAM Severity Score:    ASAM Recommended Level of Treatment:     Substance use Disorder (SUD)    Recommendations for Services/Supports/Treatments: Recommendations for Services/Supports/Treatments Recommendations For Services/Supports/Treatments: CD-IOP Intensive Chemical Dependency Program,Other (Comment) (DETOX INPATIENT)  DSM5 Diagnoses: Patient Active Problem List   Diagnosis Date Noted  . Viral gastroenteritis 09/16/2018  . Healthcare maintenance 07/02/2018  . Infection of lip 07/28/2016  . Infected lip laceration 07/28/2016  . Alcohol dependence with acute alcoholic intoxication ([place]) 07/25/2016  . Major depressive disorder, recurrent severe without psychotic features ([place]) 07/25/2016  . MDD (major depressive disorder) 07/25/2016  . Atypical chest pain 10/06/2015  . Eczema 10/06/2015  . Alcoholism in remission ([place]) 08/16/2015  . HIV disease ([place]) 06/04/2014  . HZV (herpes zoster virus) post herpetic neuralgia 06/04/2014  . Rash and nonspecific skin eruption 05/11/2014  . Rib pain on left side 10/15/2013  . Abnormal Pap smear   . ASCUS (atypical squamous cells of undetermined significance) on Pap smear 07/24/2012  . Insomnia 12/20/2011  . Anemia 06/21/2011  . Gynecomastia 05/31/2011  . Zoster ophthalmicus 05/31/2011  . Headache(784.0) 02/15/2011  . Neuropathic pain 11/02/2010  . Cyst on ear 11/02/2010  . ACUTE GASTRITIS WITHOUT MENTION OF HEMORRHAGE 12/29/2009  . CONSTIPATION 12/29/2009  . Alcohol dependence with alcohol-induced mood disorder ([place]) 06/07/2009  . HERPES ZOSTER 12/07/2008  . NEURALGIA, TRIGEMINAL 12/07/2008  . ANEMIA, MACROCYTIC, CHRONIC 11/23/2008  . POLYURIA 11/23/2008  . NOCTURIA 11/23/2008  . GYNECOMASTIA 09/21/2008  . CARBUNCLE AND FURUNCLE OF UNSPECIFIED SITE  06/19/2008  . Leukocytopenia 03/16/2008  . ROSACEA 07/22/2007  . EDEMA 07/22/2007  . PAROXYSMAL NOCTURNAL DYSPNEA 04/01/2007  . Pruritic disorder 01/24/2007  . DIARRHEA 01/24/2007  . Major depressive disorder with current active episode 12/10/2006  . Human immunodeficiency virus (HIV) disease ([place]) 01/13/2006    Patient Centered Plan: Patient is on the following Treatment Plan(s):    Referrals to Alternative Service(s): Referred to Alternative Service(s):   Place:   Date:   Time:    Referred to Alternative Service(s):   Place:   Date:   Time:    Referred to Alternative Service(s):   Place:   Date:   Time:    Referred to Alternative Service(s):   Place:   Date:   Time:     Gerre Scull, [place]

## 2020-04-13 ENCOUNTER — Ambulatory Visit: Payer: Self-pay | Admitting: Family

## 2020-04-20 ENCOUNTER — Telehealth: Payer: Self-pay | Admitting: *Deleted

## 2020-04-20 NOTE — Telephone Encounter (Signed)
Received signed record request from [facility]. Sent last office note, medication list, labs. Landis Gandy, RN

## 2020-05-06 ENCOUNTER — Ambulatory Visit (INDEPENDENT_AMBULATORY_CARE_PROVIDER_SITE_OTHER): Payer: Self-pay | Admitting: Family

## 2020-05-06 ENCOUNTER — Ambulatory Visit: Payer: Self-pay

## 2020-05-06 ENCOUNTER — Other Ambulatory Visit: Payer: Self-pay

## 2020-05-06 ENCOUNTER — Encounter: Payer: Self-pay | Admitting: Family

## 2020-05-06 VITALS — BP 101/71 | HR 112 | Temp 98.5°F | Wt 151.2 lb

## 2020-05-06 DIAGNOSIS — F1021 Alcohol dependence, in remission: Secondary | ICD-10-CM

## 2020-05-06 DIAGNOSIS — Z113 Encounter for screening for infections with a predominantly sexual mode of transmission: Secondary | ICD-10-CM

## 2020-05-06 DIAGNOSIS — Z0000 Encounter for general adult medical examination without abnormal findings: Secondary | ICD-10-CM

## 2020-05-06 DIAGNOSIS — B20 Human immunodeficiency virus [HIV] disease: Secondary | ICD-10-CM

## 2020-05-06 MED ORDER — TRIUMEQ 600-50-300 MG PO TABS: 1.0000 | ORAL_TABLET | Freq: Every day | ORAL | 5 refills | Status: DC

## 2020-05-06 MED ORDER — AMITRIPTYLINE HCL 50 MG PO TABS: 50.0000 mg | ORAL_TABLET | Freq: Every day | ORAL | 5 refills | Status: DC

## 2020-05-06 NOTE — Patient Instructions (Addendum)
Nice to see you.  Continue to take your Triumeq as prescribed.   Refills have been sent to the pharmacy.    Keep up the great work.   We will check your lab work today.   Plan for follow up in 2 months or sooner If needed.   Have a great day and stay safe!

## 2020-05-06 NOTE — Progress Notes (Signed)
Subjective:    Patient ID: Arthur Lynch, male    DOB: 1960-08-31, 60 y.o.   MRN: [id number]  Chief Complaint  Patient presents with  . Follow-up    ADAP,     HPI:  Arthur Lynch is a 60 y.o. male with HIV disease last seen on 01/13/2020 with well-controlled virus and good adherence and tolerance to his ART regimen of Triumeq.  Viral load at the time was undetectable with CD4 count of 511.  In the interim he has had a relapse of his alcoholism and has since detoxed and checked himself into rehabilitation.  Here today for routine follow-up.  Arthur Lynch continues to take his Triumeq daily as prescribed with no adverse side effects or missed doses since his last office visit.  Overall he is feeling significantly improved since starting rehabilitation. Denies fevers, chills, night sweats, headaches, changes in vision, neck pain/stiffness, nausea, diarrhea, vomiting, lesions or rashes.  Arthur Lynch has no problems obtaining medication from the pharmacy.  He is currently in treatment for his depression as well as alcoholism.  No recreational or illicit drug use, tobacco use, or alcohol consumption.  Condoms declined.  Healthcare maintenance due includes influenza vaccine.  Allergies  Allergen Reactions  . Penicillins Itching    Has patient had a PCN reaction causing immediate rash, facial/tongue/throat swelling, SOB or lightheadedness with hypotension: No Has patient had a PCN reaction causing severe rash involving mucus membranes or skin necrosis: No Has patient had a PCN reaction that required hospitalization No Has patient had a PCN reaction occurring within the last 10 years: No If all of the above answers are "NO", then may proceed with Cephalosporin use.       Outpatient Medications Prior to Visit  Medication Sig Dispense Refill  . abacavir-dolutegravir-lamiVUDine (TRIUMEQ) 600-50-300 MG tablet Take 1 tablet by mouth daily. 30 tablet 5  . amitriptyline (ELAVIL) 50 MG tablet  Take 1 tablet (50 mg total) by mouth at bedtime. 30 tablet 5   No facility-administered medications prior to visit.     Past Medical History:  Diagnosis Date  . Abnormal Pap smear   . Alcoholism in remission ([place]) 08/16/2015  . Anemia   . Atypical chest pain 10/06/2015  . Cancer ([place])   . COPD (chronic obstructive pulmonary disease) ([place])   . Depression   . Eczema 10/06/2015  . HIV (human immunodeficiency virus infection) ([place])   . Murmur, heart   . Neuromuscular disorder ([place])   . Substance abuse [facility])      Past Surgical History:  Procedure Laterality Date  . ANOSCOPY         Review of Systems  Constitutional: Negative for appetite change, chills, fatigue, fever and unexpected weight change.  Eyes: Negative for visual disturbance.  Respiratory: Negative for cough, chest tightness, shortness of breath and wheezing.   Cardiovascular: Negative for chest pain and leg swelling.  Gastrointestinal: Negative for abdominal pain, constipation, diarrhea, nausea and vomiting.  Genitourinary: Negative for dysuria, flank pain, frequency, genital sores, hematuria and urgency.  Skin: Negative for rash.  Allergic/Immunologic: Negative for immunocompromised state.  Neurological: Negative for dizziness and headaches.      Objective:    BP 101/71   Pulse (!) 112   Temp 98.5 F (36.9 C) (Oral)   Wt 151 lb 3.2 oz (68.6 kg)   BMI 19.41 kg/m  Nursing note and vital signs reviewed.  Physical Exam Constitutional:      General: He is not in acute  distress.    Appearance: He is well-developed.  HENT:     Mouth/Throat:     Mouth: Oropharynx is clear and moist.  Eyes:     Conjunctiva/sclera: Conjunctivae normal.  Cardiovascular:     Rate and Rhythm: Normal rate and regular rhythm.     Pulses: Intact distal pulses.     Heart sounds: Normal heart sounds. No murmur heard. No friction rub. No gallop.   Pulmonary:     Effort: Pulmonary effort is normal. No respiratory distress.      Breath sounds: Normal breath sounds. No wheezing or rales.  Chest:     Chest wall: No tenderness.  Abdominal:     General: Bowel sounds are normal.     Palpations: Abdomen is soft.     Tenderness: There is no abdominal tenderness.  Musculoskeletal:     Cervical back: Neck supple.  Lymphadenopathy:     Cervical: No cervical adenopathy.  Skin:    General: Skin is warm and dry.     Findings: No rash.  Neurological:     Mental Status: He is alert and oriented to person, place, and time.  Psychiatric:        Mood and Affect: Mood and affect normal.        Behavior: Behavior normal.        Thought Content: Thought content normal.        Judgment: Judgment normal.      Depression screen [facility] 2/9 01/13/2020 11/24/2019 10/24/2019 10/24/2019 08/08/2019  Decreased Interest 0 0 0 0 0  Down, Depressed, Hopeless 0 0 - 0 0  PHQ - 2 Score 0 0 0 0 0  Altered sleeping - - - - -  Tired, decreased energy - - - - -  Change in appetite - - - - -  Feeling bad or failure about yourself  - - - - -  Trouble concentrating - - - - -  Moving slowly or fidgety/restless - - - - -  Suicidal thoughts - - - - -  PHQ-9 Score - - - - -  Some recent data might be hidden       Assessment & Plan:    Patient Active Problem List   Diagnosis Date Noted  . Viral gastroenteritis 09/16/2018  . Healthcare maintenance 07/02/2018  . Infection of lip 07/28/2016  . Infected lip laceration 07/28/2016  . Alcohol dependence with acute alcoholic intoxication ([place]) 07/25/2016  . Major depressive disorder, recurrent severe without psychotic features ([place]) 07/25/2016  . MDD (major depressive disorder) 07/25/2016  . Atypical chest pain 10/06/2015  . Eczema 10/06/2015  . Alcoholism in remission ([place]) 08/16/2015  . HIV disease ([place]) 06/04/2014  . HZV (herpes zoster virus) post herpetic neuralgia 06/04/2014  . Rash and nonspecific skin eruption 05/11/2014  . Rib pain on left side 10/15/2013  . Abnormal Pap smear   . ASCUS  (atypical squamous cells of undetermined significance) on Pap smear 07/24/2012  . Insomnia 12/20/2011  . Anemia 06/21/2011  . Gynecomastia 05/31/2011  . Zoster ophthalmicus 05/31/2011  . Headache(784.0) 02/15/2011  . Neuropathic pain 11/02/2010  . Cyst on ear 11/02/2010  . ACUTE GASTRITIS WITHOUT MENTION OF HEMORRHAGE 12/29/2009  . CONSTIPATION 12/29/2009  . Alcohol dependence with alcohol-induced mood disorder ([place]) 06/07/2009  . HERPES ZOSTER 12/07/2008  . NEURALGIA, TRIGEMINAL 12/07/2008  . ANEMIA, MACROCYTIC, CHRONIC 11/23/2008  . POLYURIA 11/23/2008  . NOCTURIA 11/23/2008  . GYNECOMASTIA 09/21/2008  . CARBUNCLE AND FURUNCLE OF UNSPECIFIED SITE 06/19/2008  .  Leukocytopenia 03/16/2008  . ROSACEA 07/22/2007  . EDEMA 07/22/2007  . PAROXYSMAL NOCTURNAL DYSPNEA 04/01/2007  . Pruritic disorder 01/24/2007  . DIARRHEA 01/24/2007  . Major depressive disorder with current active episode 12/10/2006  . Human immunodeficiency virus (HIV) disease ([place]) 01/13/2006     Problem List Items Addressed This Visit      Nervous and Auditory   HZV (herpes zoster virus) post herpetic neuralgia   Relevant Medications   amitriptyline (ELAVIL) 50 MG tablet     Other   Human immunodeficiency virus (HIV) disease ([place])    Arthur Lynch continues to have well-controlled HIV disease with good adherence and tolerance to his ART regimen of Triumeq.  We reviewed previous lab work and discussed plan of care.  No signs/symptoms of opportunistic infection or progressive HIV disease.  Check lab work today.  Continue current dose of Triumeq.  Plan for follow-up in 2 months or sooner if needed.      Relevant Medications   abacavir-dolutegravir-lamiVUDine (TRIUMEQ) 600-50-300 MG tablet   HIV disease ([place]) - Primary   Relevant Medications   abacavir-dolutegravir-lamiVUDine (TRIUMEQ) 600-50-300 MG tablet   Other Relevant Orders   HIV-1 RNA quant-no reflex-bld   T-helper cell (CD4)- ([facility] clinic only)   COMPLETE  METABOLIC PANEL WITH GFR   Alcoholism in remission [facility])    Arthur Lynch had a relapse of his alcoholism and currently in treatment through [facility].  Believes he may have approximately 1 week left in rehabilitation and looking to extend possibly further.  Congratulated on his accomplishments of sobriety.      Healthcare maintenance     Discussed importance of safe sexual practice to reduce risk of STI.  Condoms declined.  Influenza vaccine updated today.       Other Visit Diagnoses    Screening for STDs (sexually transmitted diseases)       Relevant Orders   RPR       I am having Arthur Lynch maintain his Triumeq and amitriptyline.   Meds ordered this encounter  Medications  . abacavir-dolutegravir-lamiVUDine (TRIUMEQ) 600-50-300 MG tablet    Sig: Take 1 tablet by mouth daily.    Dispense:  30 tablet    Refill:  5    Order Specific Question:   Supervising Provider    Answer:   Carlyle Basques [[id number]]  . amitriptyline (ELAVIL) 50 MG tablet    Sig: Take 1 tablet (50 mg total) by mouth at bedtime.    Dispense:  30 tablet    Refill:  5    Order Specific Question:   Supervising Provider    Answer:   Carlyle Basques [[id number]]     Follow-up: Return in about 2 months (around 07/04/2020), or if symptoms worsen or fail to improve.   Terri Piedra, MSN, FNP-C Nurse Practitioner [facility] [place] number: [phone]

## 2020-05-06 NOTE — Assessment & Plan Note (Signed)
Mr. Ogborn continues to have well-controlled HIV disease with good adherence and tolerance to his ART regimen of Triumeq.  We reviewed previous lab work and discussed plan of care.  No signs/symptoms of opportunistic infection or progressive HIV disease.  Check lab work today.  Continue current dose of Triumeq.  Plan for follow-up in 2 months or sooner if needed.

## 2020-05-06 NOTE — Assessment & Plan Note (Signed)
   Discussed importance of safe sexual practice to reduce risk of STI.  Condoms declined.  Influenza vaccine updated today.

## 2020-05-06 NOTE — Assessment & Plan Note (Signed)
Arthur Lynch had a relapse of his alcoholism and currently in treatment through [facility].  Believes he may have approximately 1 week left in rehabilitation and looking to extend possibly further.  Congratulated on his accomplishments of sobriety.

## 2020-05-07 ENCOUNTER — Encounter: Payer: Self-pay | Admitting: Family

## 2020-05-07 LAB — T-HELPER CELL (CD4) - (RCID CLINIC ONLY)
CD4 % Helper T Cell: 32 % — ABNORMAL LOW (ref 33–65)
CD4 T Cell Abs: 748 /uL (ref 400–1790)

## 2020-05-08 LAB — COMPLETE METABOLIC PANEL WITH GFR
AG Ratio: 1.1 (calc) (ref 1.0–2.5)
ALT: 12 U/L (ref 9–46)
AST: 19 U/L (ref 10–35)
Albumin: 4.2 g/dL (ref 3.6–5.1)
Alkaline phosphatase (APISO): 59 U/L (ref 35–144)
BUN: 17 mg/dL (ref 7–25)
CO2: 30 mmol/L (ref 20–32)
Calcium: 10 mg/dL (ref 8.6–10.3)
Chloride: 100 mmol/L (ref 98–110)
Creat: 1.23 mg/dL (ref 0.70–1.33)
GFR, Est African American: 75 mL/min/{1.73_m2} (ref >=60)
GFR, Est Non African American: 65 mL/min/{1.73_m2} (ref >=60)
Globulin: 3.7 g/dL (calc) (ref 1.9–3.7)
Glucose, Bld: 82 mg/dL (ref 65–99)
Potassium: 4.6 mmol/L (ref 3.5–5.3)
Sodium: 136 mmol/L (ref 135–146)
Total Bilirubin: 0.4 mg/dL (ref 0.2–1.2)
Total Protein: 7.9 g/dL (ref 6.1–8.1)

## 2020-05-08 LAB — RPR: RPR Ser Ql: NONREACTIVE

## 2020-05-08 LAB — HIV-1 RNA QUANT-NO REFLEX-BLD
HIV 1 RNA Quant: 88 Copies/mL — ABNORMAL HIGH
HIV-1 RNA Quant, Log: 1.94 Log cps/mL — ABNORMAL HIGH

## 2020-06-16 ENCOUNTER — Encounter: Payer: Self-pay | Admitting: Internal Medicine

## 2020-06-16 ENCOUNTER — Other Ambulatory Visit: Payer: Self-pay

## 2020-06-16 ENCOUNTER — Ambulatory Visit: Payer: Self-pay | Admitting: Internal Medicine

## 2020-06-16 VITALS — BP 101/67 | HR 87 | Ht 73.0 in | Wt 152.1 lb

## 2020-06-16 DIAGNOSIS — F10920 Alcohol use, unspecified with intoxication, uncomplicated: Secondary | ICD-10-CM

## 2020-06-16 DIAGNOSIS — B20 Human immunodeficiency virus [HIV] disease: Secondary | ICD-10-CM

## 2020-06-16 DIAGNOSIS — F331 Major depressive disorder, recurrent, moderate: Secondary | ICD-10-CM

## 2020-06-16 DIAGNOSIS — N62 Hypertrophy of breast: Secondary | ICD-10-CM

## 2020-06-16 NOTE — Progress Notes (Signed)
New Patient Office Visit  Subjective:  Patient ID: Arthur Lynch, male    DOB: [birth year]  Age: 60 y.o. MRN: [id number]  CC:  Chief Complaint  Patient presents with  . Breast Mass    Right side of chest is tender, can feel a small lump. Slightly irritated and painful.    HPI Arthur Lynch presents for presents for first appt. Patient has concerns of a lump on right side of chest.  Past Medical History:  Diagnosis Date  . Abnormal Pap smear   . Alcoholism in remission ([place]) 08/16/2015  . Anemia   . Atypical chest pain 10/06/2015  . Cancer ([place])   . COPD (chronic obstructive pulmonary disease) ([place])   . Depression   . Eczema 10/06/2015  . HIV (human immunodeficiency virus infection) ([place])   . Murmur, heart   . Neuromuscular disorder ([place])   . Substance abuse [facility])     Past Surgical History:  Procedure Laterality Date  . ANOSCOPY      No family history on file.  Social History   Socioeconomic History  . Marital status: Single    Spouse name: Not on file  . Number of children: Not on file  . Years of education: Not on file  . Highest education level: Not on file  Occupational History  . Not on file  Tobacco Use  . Smoking status: Never Smoker  . Smokeless tobacco: Never Used  . Tobacco comment: Given condoms  Substance and Sexual Activity  . Alcohol use: Yes    Alcohol/week: 45.0 standard drinks    Types: 45 Standard drinks or equivalent per week    Comment: relapsed; socially   . Drug use: No  . Sexual activity: Never    Partners: Male    Birth control/protection: Condom    Comment: pt declined condoms  Other Topics Concern  . Not on file  Social History Narrative  . Not on file   Social Determinants of Health   Financial Resource Strain: Not on file  Food Insecurity: Not on file  Transportation Needs: Not on file  Physical Activity: Not on file  Stress: Not on file  Social Connections: Not on file  Intimate Partner Violence: Not on file     ROS Review of Systems  Objective:   Today's Vitals: BP 101/67 (BP Location: Left Arm, Patient Position: Sitting, Cuff Size: Normal)   Pulse 87   Ht 6\' 1"  (1.854 m)   Wt 152 lb 1.6 oz (69 kg)   SpO2 97%   BMI 20.07 kg/m   Physical Exam  Assessment & Plan:   Problem List Items Addressed This Visit      Other   GYNECOMASTIA - Primary      Outpatient Encounter Medications as of 06/16/2020  Medication Sig  . abacavir-dolutegravir-lamiVUDine (TRIUMEQ) 600-50-300 MG tablet Take 1 tablet by mouth daily.  Marland Kitchen amitriptyline (ELAVIL) 50 MG tablet Take 1 tablet (50 mg total) by mouth at bedtime.   No facility-administered encounter medications on file as of 06/16/2020.  1. GYNECOMASTIA  Right side mass pt has been aware of for several weeks. Pt states he had a similar problem years ago which resolved. The mass is non-tender and seems to be getting bigger in his opinion. No new changes in medication that he is aware of.   On chronic HIV suppression with recent low titer.    Mass in center of right breast, with no palpable auxilliary or supercuvicular nodes. Mass is 2in  x 2in, freely movable. Skin is intact. Plan mamorgram within next 2 wks and follow up 07/14/2020.    Follow-up: No follow-ups on file.   Dustie Brittle Charyl Bigger

## 2020-06-17 LAB — URINALYSIS, ROUTINE W REFLEX MICROSCOPIC
Bilirubin, UA: NEGATIVE
Glucose, UA: NEGATIVE
Ketones, UA: NEGATIVE
Leukocytes,UA: NEGATIVE
Nitrite, UA: NEGATIVE
RBC, UA: NEGATIVE
Specific Gravity, UA: 1.022 (ref 1.005–1.030)
Urobilinogen, Ur: 1 mg/dL (ref 0.2–1.0)
pH, UA: 5.5 (ref 5.0–7.5)

## 2020-06-17 LAB — CBC WITH DIFFERENTIAL/PLATELET
Basophils Absolute: 0 10*3/uL (ref 0.0–0.2)
Basos: 0 %
EOS (ABSOLUTE): 0 10*3/uL (ref 0.0–0.4)
Eos: 1 %
Hematocrit: 35.6 % — ABNORMAL LOW (ref 37.5–51.0)
Hemoglobin: 12.2 g/dL — ABNORMAL LOW (ref 13.0–17.7)
Immature Grans (Abs): 0 10*3/uL (ref 0.0–0.1)
Immature Granulocytes: 0 %
Lymphocytes Absolute: 2.3 10*3/uL (ref 0.7–3.1)
Lymphs: 45 %
MCH: 34.2 pg — ABNORMAL HIGH (ref 26.6–33.0)
MCHC: 34.3 g/dL (ref 31.5–35.7)
MCV: 100 fL — ABNORMAL HIGH (ref 79–97)
Monocytes Absolute: 0.5 10*3/uL (ref 0.1–0.9)
Monocytes: 10 %
Neutrophils Absolute: 2.3 10*3/uL (ref 1.4–7.0)
Neutrophils: 44 %
Platelets: 333 10*3/uL (ref 150–450)
RBC: 3.57 x10E6/uL — ABNORMAL LOW (ref 4.14–5.80)
RDW: 11.5 % — ABNORMAL LOW (ref 11.6–15.4)
WBC: 5.2 10*3/uL (ref 3.4–10.8)

## 2020-06-17 LAB — LIPID PANEL W/O CHOL/HDL RATIO
Cholesterol, Total: 155 mg/dL (ref 100–199)
HDL: 58 mg/dL (ref >39)
LDL Chol Calc (NIH): 86 mg/dL (ref 0–99)
Triglycerides: 50 mg/dL (ref 0–149)
VLDL Cholesterol Cal: 11 mg/dL (ref 5–40)

## 2020-06-17 LAB — COMPREHENSIVE METABOLIC PANEL
ALT: 8 IU/L (ref 0–44)
AST: 16 IU/L (ref 0–40)
Albumin/Globulin Ratio: 1.2 (ref 1.2–2.2)
Albumin: 4.3 g/dL (ref 3.8–4.9)
Alkaline Phosphatase: 77 IU/L (ref 44–121)
BUN/Creatinine Ratio: 12 (ref 9–20)
BUN: 15 mg/dL (ref 6–24)
Bilirubin Total: 0.6 mg/dL (ref 0.0–1.2)
CO2: 23 mmol/L (ref 20–29)
Calcium: 9.9 mg/dL (ref 8.7–10.2)
Chloride: 100 mmol/L (ref 96–106)
Creatinine, Ser: 1.25 mg/dL (ref 0.76–1.27)
Globulin, Total: 3.5 g/dL (ref 1.5–4.5)
Glucose: 84 mg/dL (ref 65–99)
Potassium: 4.5 mmol/L (ref 3.5–5.2)
Sodium: 138 mmol/L (ref 134–144)
Total Protein: 7.8 g/dL (ref 6.0–8.5)
eGFR: 67 mL/min/{1.73_m2} (ref >59)

## 2020-06-17 LAB — HEMOGLOBIN A1C
Est. average glucose Bld gHb Est-mCnc: 120 mg/dL
Hgb A1c MFr Bld: 5.8 % — ABNORMAL HIGH (ref 4.8–5.6)

## 2020-06-17 LAB — TSH: TSH: 1.15 u[IU]/mL (ref 0.450–4.500)

## 2020-07-03 ENCOUNTER — Other Ambulatory Visit: Payer: Self-pay | Admitting: Family

## 2020-07-03 DIAGNOSIS — B20 Human immunodeficiency virus [HIV] disease: Secondary | ICD-10-CM

## 2020-07-07 ENCOUNTER — Telehealth: Payer: Self-pay

## 2020-07-07 NOTE — Telephone Encounter (Signed)
Received call from patient, he is currently staying in [place] and his medication shipments have been arriving late. Patient has spoken to [facility] and updated them regarding his current address, but his medication is still not arriving on time. He also states that the pharmacy told him his amitriptyline is not covered by [employer].   Advised patient to reach out to [facility] approximately 1 week before his medication runs out to arrange shipping.   RN spoke to [place] at [facility], he confirms the amitriptyline is covered at no cost and that it is ready to be picked up along with the Triumeq. Patient states he will pick up after his appointment tomorrow 07/07/20.  Suezanne Jacquet believes the issue regarding late arrival was due to the patient changing the address of delivery and that it should not happen next month now that the patient has given them updated contact information.   Beryle Flock, RN

## 2020-07-08 ENCOUNTER — Ambulatory Visit (INDEPENDENT_AMBULATORY_CARE_PROVIDER_SITE_OTHER): Payer: Self-pay | Admitting: Family

## 2020-07-08 ENCOUNTER — Other Ambulatory Visit: Payer: Self-pay

## 2020-07-08 ENCOUNTER — Encounter: Payer: Self-pay | Admitting: Family

## 2020-07-08 VITALS — Wt 152.0 lb

## 2020-07-08 DIAGNOSIS — B20 Human immunodeficiency virus [HIV] disease: Secondary | ICD-10-CM

## 2020-07-08 DIAGNOSIS — Z0000 Encounter for general adult medical examination without abnormal findings: Secondary | ICD-10-CM

## 2020-07-08 DIAGNOSIS — F1021 Alcohol dependence, in remission: Secondary | ICD-10-CM

## 2020-07-08 NOTE — Patient Instructions (Signed)
Nice to see you.  We will check your lab work today.  Continue to take your Triumeq.  Refills have been sent to the pharmacy.  Plan for follow up in 3 months or sooner if needed with lab work 1-2 weeks prior to appointment.   Have a great day and keep up the great work!

## 2020-07-08 NOTE — Progress Notes (Signed)
Subjective:    Patient ID: Arthur Lynch, male    DOB: 1960/06/23, 60 y.o.   MRN: [id number]  Chief Complaint  Patient presents with  . Follow-up    Doing very well at his residential facility in [place], [place]     HPI:  Arthur Lynch is a 60 y.o. male with HIV disease last seen on 05/06/2020 with well-controlled virus and good adherence and tolerance to his ART regimen of Triumeq.  Viral load at the time was 88 with CD4 count of 748.  Remains in [facility] for rehabilitation.  Here today for routine follow-up.  Arthur Lynch continues to take his Triumeq daily as prescribed with no adverse side effects or missed doses since his last office visit.  Overall feeling well with no new concerns/complaints.  Rehabilitation continues to go well.  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 Denies fevers, chills, night sweats, headaches, changes in vision, neck pain/stiffness, nausea, diarrhea, vomiting, lesions or rashes.  [name] had a problem obtaining medication from the pharmacy last month secondary to change of address which should be resolved this month.  Denies feelings of being down, depressed, or hopeless recently.  No recreational or illicit drug use, tobacco use, or alcohol consumption.  He did cut his fingers recently on a sink when trying to install it.  Most recent tetanus shot was in 2020.    Allergies  Allergen Reactions  . Penicillins Itching    Has patient had a PCN reaction causing immediate rash, facial/tongue/throat swelling, SOB or lightheadedness with hypotension: No Has patient had a PCN reaction causing severe rash involving mucus membranes or skin necrosis: No Has patient had a PCN reaction that required hospitalization No Has patient had a PCN reaction occurring within the last 10 years: No If all of the above answers are "NO", then may proceed with Cephalosporin use.       Outpatient Medications Prior to Visit  Medication Sig  Dispense Refill  . abacavir-dolutegravir-lamiVUDine (TRIUMEQ) 600-50-300 MG tablet Take 1 tablet by mouth daily. 30 tablet 5  . amitriptyline (ELAVIL) 50 MG tablet Take 1 tablet (50 mg total) by mouth at bedtime. 30 tablet 5   No facility-administered medications prior to visit.     Past Medical History:  Diagnosis Date  . Abnormal Pap smear   . Alcoholism in remission ([place]) 08/16/2015  . Anemia   . Atypical chest pain 10/06/2015  . Cancer ([place])   . COPD (chronic obstructive pulmonary disease) ([place])   . Depression   . Eczema 10/06/2015  . HIV (human immunodeficiency virus infection) ([place])   . Murmur, heart   . Neuromuscular disorder ([place])   . Substance abuse [facility])      Past Surgical History:  Procedure Laterality Date  . ANOSCOPY         Review of Systems  Constitutional: Negative for appetite change, chills, fatigue, fever and unexpected weight change.  Eyes: Negative for visual disturbance.  Respiratory: Negative for cough, chest tightness, shortness of breath and wheezing.   Cardiovascular: Negative for chest pain and leg swelling.  Gastrointestinal: Negative for abdominal pain, constipation, diarrhea, nausea and vomiting.  Genitourinary: Negative for dysuria, flank pain, frequency, genital sores, hematuria and urgency.  Skin: Negative for rash.  Allergic/Immunologic: Negative for immunocompromised state.  Neurological: Negative for dizziness and headaches.      Objective:    Wt 152 lb (68.9 kg)   BMI 20.05 kg/m  Nursing note and vital  signs reviewed.  Physical Exam Constitutional:      General: He is not in acute distress.    Appearance: He is well-developed.  Eyes:     Conjunctiva/sclera: Conjunctivae normal.  Cardiovascular:     Rate and Rhythm: Normal rate and regular rhythm.     Heart sounds: Normal heart sounds. No murmur heard. No friction rub. No gallop.   Pulmonary:     Effort: Pulmonary effort is normal. No respiratory distress.     Breath  sounds: Normal breath sounds. No wheezing or rales.  Chest:     Chest wall: No tenderness.  Abdominal:     General: Bowel sounds are normal.     Palpations: Abdomen is soft.     Tenderness: There is no abdominal tenderness.  Musculoskeletal:     Cervical back: Neck supple.  Lymphadenopathy:     Cervical: No cervical adenopathy.  Skin:    General: Skin is warm and dry.     Findings: No rash.  Neurological:     Mental Status: He is alert and oriented to person, place, and time.  Psychiatric:        Behavior: Behavior normal.        Thought Content: Thought content normal.        Judgment: Judgment normal.      Depression screen [facility] 2/9 07/08/2020 01/13/2020 11/24/2019 10/24/2019 10/24/2019  Decreased Interest 0 0 0 0 0  Down, Depressed, Hopeless 0 0 0 - 0  PHQ - 2 Score 0 0 0 0 0  Altered sleeping - - - - -  Tired, decreased energy - - - - -  Change in appetite - - - - -  Feeling bad or failure about yourself  - - - - -  Trouble concentrating - - - - -  Moving slowly or fidgety/restless - - - - -  Suicidal thoughts - - - - -  PHQ-9 Score - - - - -  Some recent data might be hidden       Assessment & Plan:    Patient Active Problem List   Diagnosis Date Noted  . Viral gastroenteritis 09/16/2018  . Healthcare maintenance 07/02/2018  . Infection of lip 07/28/2016  . Infected lip laceration 07/28/2016  . Alcohol dependence with acute alcoholic intoxication ([place]) 07/25/2016  . Major depressive disorder, recurrent severe without psychotic features ([place]) 07/25/2016  . MDD (major depressive disorder) 07/25/2016  . Atypical chest pain 10/06/2015  . Eczema 10/06/2015  . Alcoholism in remission ([place]) 08/16/2015  . HIV disease ([place]) 06/04/2014  . HZV (herpes zoster virus) post herpetic neuralgia 06/04/2014  . Rash and nonspecific skin eruption 05/11/2014  . Rib pain on left side 10/15/2013  . Abnormal Pap smear   . ASCUS (atypical squamous cells of undetermined significance)  on Pap smear 07/24/2012  . Insomnia 12/20/2011  . Anemia 06/21/2011  . Gynecomastia 05/31/2011  . Zoster ophthalmicus 05/31/2011  . Headache(784.0) 02/15/2011  . Neuropathic pain 11/02/2010  . Cyst on ear 11/02/2010  . ACUTE GASTRITIS WITHOUT MENTION OF HEMORRHAGE 12/29/2009  . CONSTIPATION 12/29/2009  . Alcohol dependence with alcohol-induced mood disorder ([place]) 06/07/2009  . HERPES ZOSTER 12/07/2008  . NEURALGIA, TRIGEMINAL 12/07/2008  . ANEMIA, MACROCYTIC, CHRONIC 11/23/2008  . POLYURIA 11/23/2008  . NOCTURIA 11/23/2008  . GYNECOMASTIA 09/21/2008  . CARBUNCLE AND FURUNCLE OF UNSPECIFIED SITE 06/19/2008  . Leukocytopenia 03/16/2008  . ROSACEA 07/22/2007  . EDEMA 07/22/2007  . PAROXYSMAL NOCTURNAL DYSPNEA 04/01/2007  . Pruritic disorder 01/24/2007  .  DIARRHEA 01/24/2007  . Major depressive disorder with current active episode 12/10/2006  . Human immunodeficiency virus (HIV) disease ([place]) 01/13/2006     Problem List Items Addressed This Visit      Other   HIV disease ([place]) - Primary    Mr. Brannock continues to have well-controlled HIV disease with good adherence and tolerance to his ART regimen of Triumeq.  No signs/symptoms of opportunistic infection or progressive HIV.  We reviewed previous lab work and discussed plan of care.  Continue current dose of Triumeq.  Plan for follow-up in 3 months or sooner if needed with lab work 1 to 2 weeks prior to appointment.      Relevant Orders   HIV-1 RNA quant-no reflex-bld   T-helper cell (CD4)- ([facility] clinic only) (Completed)   HIV-1 RNA quant-no reflex-bld   T-helper cell (CD4)- ([facility] clinic only)   Alcoholism in remission [facility])    Mr. Jimmye Norman continues to remain sober and is in vocational rehab.  Congratulated on his efforts.  Continue current therapies.      Healthcare maintenance     Discussed importance of safe sexual practice to reduce risk of STI.  Condoms declined.  Vaccines up-to-date per recommendation.           I am having Flonnie Hailstone maintain his Triumeq and amitriptyline.    Follow-up: Return in about 4 months (around 11/07/2020), or if symptoms worsen or fail to improve.   Terri Piedra, MSN, FNP-C Nurse Practitioner [facility] [place] number: [phone]

## 2020-07-09 LAB — T-HELPER CELL (CD4) - (RCID CLINIC ONLY)
CD4 % Helper T Cell: 36 % (ref 33–65)
CD4 T Cell Abs: 698 /uL (ref 400–1790)

## 2020-07-09 NOTE — Assessment & Plan Note (Signed)
[name] continues to remain sober and is in vocational rehab.  Congratulated on his efforts.  Continue current therapies.

## 2020-07-09 NOTE — Assessment & Plan Note (Signed)
   Discussed importance of safe sexual practice to reduce risk of STI.  Condoms declined.  Vaccines up-to-date per recommendation.

## 2020-07-09 NOTE — Assessment & Plan Note (Signed)
Arthur Lynch continues to have well-controlled HIV disease with good adherence and tolerance to his ART regimen of Triumeq.  No signs/symptoms of opportunistic infection or progressive HIV.  We reviewed previous lab work and discussed plan of care.  Continue current dose of Triumeq.  Plan for follow-up in 3 months or sooner if needed with lab work 1 to 2 weeks prior to appointment.

## 2020-07-11 LAB — HIV-1 RNA QUANT-NO REFLEX-BLD
HIV 1 RNA Quant: NOT DETECTED Copies/mL
HIV-1 RNA Quant, Log: NOT DETECTED Log cps/mL

## 2020-07-14 ENCOUNTER — Encounter: Payer: Self-pay | Admitting: Internal Medicine

## 2020-07-14 ENCOUNTER — Ambulatory Visit: Payer: Self-pay | Admitting: Internal Medicine

## 2020-07-14 ENCOUNTER — Other Ambulatory Visit: Payer: Self-pay

## 2020-07-14 VITALS — BP 94/65 | HR 91 | Temp 97.8°F | Ht 73.0 in | Wt 149.8 lb

## 2020-07-14 DIAGNOSIS — N62 Hypertrophy of breast: Secondary | ICD-10-CM

## 2020-07-14 NOTE — Progress Notes (Signed)
Established Patient Office Visit  Subjective:  Patient ID: Arthur Lynch, male    DOB: June 18, 1960  Age: 60 y.o. MRN: [id number]  CC:  Chief Complaint  Patient presents with  . Follow-up    Lab follow up  No mammogram done     HPI Arthur Lynch presents for fu of labs and gynecomastia.   Past Medical History:  Diagnosis Date  . Abnormal Pap smear   . Alcoholism in remission ([place]) 08/16/2015  . Anemia   . Atypical chest pain 10/06/2015  . Cancer ([place])   . COPD (chronic obstructive pulmonary disease) ([place])   . Depression   . Eczema 10/06/2015  . HIV (human immunodeficiency virus infection) ([place])   . Murmur, heart   . Neuromuscular disorder ([place])   . Substance abuse [facility])     Past Surgical History:  Procedure Laterality Date  . ANOSCOPY      History reviewed. No pertinent family history.  Social History   Socioeconomic History  . Marital status: Single    Spouse name: Not on file  . Number of children: Not on file  . Years of education: Not on file  . Highest education level: Not on file  Occupational History  . Not on file  Tobacco Use  . Smoking status: Never Smoker  . Smokeless tobacco: Never Used  . Tobacco comment: Given condoms  Substance and Sexual Activity  . Alcohol use: Not Currently    Alcohol/week: 45.0 standard drinks    Types: 45 Standard drinks or equivalent per week    Comment: in residential program  . Drug use: No  . Sexual activity: Never    Partners: Male    Birth control/protection: Condom    Comment: pt declined condoms  Other Topics Concern  . Not on file  Social History Narrative  . Not on file   Social Determinants of Health   Financial Resource Strain: Not on file  Food Insecurity: Not on file  Transportation Needs: Not on file  Physical Activity: Not on file  Stress: Not on file  Social Connections: Not on file  Intimate Partner Violence: Not on file    Outpatient Medications Prior to Visit  Medication Sig  Dispense Refill  . abacavir-dolutegravir-lamiVUDine (TRIUMEQ) 600-50-300 MG tablet Take 1 tablet by mouth daily. 30 tablet 5  . amitriptyline (ELAVIL) 50 MG tablet Take 1 tablet (50 mg total) by mouth at bedtime. 30 tablet 5   No facility-administered medications prior to visit.    Allergies  Allergen Reactions  . Penicillins Itching    Has patient had a PCN reaction causing immediate rash, facial/tongue/throat swelling, SOB or lightheadedness with hypotension: No Has patient had a PCN reaction causing severe rash involving mucus membranes or skin necrosis: No Has patient had a PCN reaction that required hospitalization No Has patient had a PCN reaction occurring within the last 10 years: No If all of the above answers are "NO", then may proceed with Cephalosporin use.     ROS Review of Systems    Objective:    Physical Exam  BP 94/65 (BP Location: Right Arm, Patient Position: Sitting)   Pulse 91   Temp 97.8 F (36.6 C)   Ht $R'6\' 1"'sh$  (1.854 m)   Wt 149 lb 12.8 oz (67.9 kg)   SpO2 97%   BMI 19.76 kg/m  Wt Readings from Last 3 Encounters:  07/14/20 149 lb 12.8 oz (67.9 kg)  07/08/20 152 lb (68.9 kg)  06/16/20 152 lb  1.6 oz (69 kg)     Health Maintenance Due  Topic Date Due  . COLONOSCOPY (Pts 45-94yrs Insurance coverage will need to be confirmed)  Never done  . COVID-19 Vaccine (3 - Pfizer risk 4-dose series) 08/16/2019    There are no preventive care reminders to display for this patient.  Lab Results  Component Value Date   TSH 1.150 06/16/2020   Lab Results  Component Value Date   WBC 5.2 06/16/2020   HGB 12.2 (L) 06/16/2020   HCT 35.6 (L) 06/16/2020   MCV 100 (H) 06/16/2020   PLT 333 06/16/2020   Lab Results  Component Value Date   NA 138 06/16/2020   K 4.5 06/16/2020   CO2 23 06/16/2020   GLUCOSE 84 06/16/2020   BUN 15 06/16/2020   CREATININE 1.25 06/16/2020   BILITOT 0.6 06/16/2020   ALKPHOS 77 06/16/2020   AST 16 06/16/2020   ALT 8 06/16/2020    PROT 7.8 06/16/2020   ALBUMIN 4.3 06/16/2020   CALCIUM 9.9 06/16/2020   ANIONGAP 16 (H) 02/19/2020   EGFR 67 06/16/2020   Lab Results  Component Value Date   CHOL 155 06/16/2020   Lab Results  Component Value Date   HDL 58 06/16/2020   Lab Results  Component Value Date   LDLCALC 86 06/16/2020   Lab Results  Component Value Date   TRIG 50 06/16/2020   Lab Results  Component Value Date   CHOLHDL 2.6 07/08/2019   Lab Results  Component Value Date   HGBA1C 5.8 (H) 06/16/2020      Assessment & Plan:   Problem List Items Addressed This Visit      Other   GYNECOMASTIA - Primary      No orders of the defined types were placed in this encounter. 1. GYNECOMASTIA Unilateral right breast mass. Getting slightly bigger over the last several months. Referral for mammogram from last visit did not get completed, will try to update referral today.   Reviewed previous labs, all appear to be normal with slight persistent anemia.   Follow-up: Return in about 1 week (around 07/21/2020).    Thurman Sarver Charyl Bigger

## 2020-07-15 ENCOUNTER — Ambulatory Visit: Payer: Self-pay

## 2020-07-21 ENCOUNTER — Telehealth: Payer: Self-pay

## 2020-07-21 ENCOUNTER — Ambulatory Visit: Payer: Self-pay | Admitting: Internal Medicine

## 2020-07-21 DIAGNOSIS — N62 Hypertrophy of breast: Secondary | ICD-10-CM

## 2020-07-21 NOTE — Telephone Encounter (Signed)
Previous order was incorrect for the diagnostic mammo. Order corrected and entered.

## 2020-07-21 NOTE — Telephone Encounter (Signed)
Spoke with Jeanella Anton about the correct orders for patient.  Orders are entered.

## 2020-07-22 ENCOUNTER — Other Ambulatory Visit: Payer: Self-pay | Admitting: Gerontology

## 2020-07-22 DIAGNOSIS — N62 Hypertrophy of breast: Secondary | ICD-10-CM

## 2020-07-23 ENCOUNTER — Other Ambulatory Visit: Payer: Self-pay

## 2020-07-23 ENCOUNTER — Ambulatory Visit
Admission: RE | Admit: 2020-07-23 | Discharge: 2020-07-23 | Disposition: A | Discharge status: Home / Self Care | Payer: Self-pay | Source: Ambulatory Visit | Attending: Internal Medicine | Admitting: Internal Medicine

## 2020-07-23 DIAGNOSIS — N62 Hypertrophy of breast: Secondary | ICD-10-CM

## 2020-07-27 ENCOUNTER — Ambulatory Visit: Payer: Self-pay

## 2020-07-27 ENCOUNTER — Other Ambulatory Visit: Payer: Self-pay

## 2020-08-03 ENCOUNTER — Ambulatory Visit: Payer: Self-pay

## 2020-08-10 ENCOUNTER — Other Ambulatory Visit: Payer: Self-pay

## 2020-08-10 ENCOUNTER — Ambulatory Visit: Payer: Self-pay

## 2020-08-10 NOTE — Progress Notes (Unsigned)
Mental Health Therapist Progress Note   Name: Arthur Lynch  Total time: 30  Type of Service: Individual Outpatient Mental Health Therapy  OBJECTIVE:  Mood: Euthymic and Affect: Appropriate Risk of harm to self or others: No plan to harm self or others  DIAGNOSIS:   GOALS ADDRESSED:  Patient will: 1.  Reduce symptoms of: substance use (alcohol)  2.  Increase knowledge and/or ability of: coping skills  3.  Demonstrate ability to: Decrease self-medicating behaviors  INTERVENTIONS: Interventions utilized:  Freight forwarder met with patient for outpatient mental health individual therapy to include ongoing assessment, support, and reinforcement.  Therapist allowed patient to "check in" since previous session; asking patient to share any positive coping skills they may have used over the previous week, along with any challenges faced.  Therapist provided supportive listening as patient processed their thoughts, emotional responses, and behaviors surrounding several stressors.  EFFECTIVENESS/PLAN: Client was alert, oriented x3, with no SI, HI, or symptoms of psychosis (risk low).  Client was pleasant and friendly, engaging openly and appropriately with therapist, benefiting from supportive listening and exploration of feelings.  Next session recommended in one to two weeks.   Arthur Better, LCSW

## 2020-08-23 ENCOUNTER — Other Ambulatory Visit: Payer: Self-pay | Admitting: Family

## 2020-08-23 DIAGNOSIS — B20 Human immunodeficiency virus [HIV] disease: Secondary | ICD-10-CM

## 2020-08-24 ENCOUNTER — Other Ambulatory Visit: Payer: Self-pay | Admitting: Infectious Diseases

## 2020-08-24 DIAGNOSIS — B20 Human immunodeficiency virus [HIV] disease: Secondary | ICD-10-CM

## 2020-09-02 ENCOUNTER — Telehealth: Payer: Self-pay | Admitting: *Deleted

## 2020-09-02 ENCOUNTER — Other Ambulatory Visit: Payer: Self-pay

## 2020-09-02 ENCOUNTER — Ambulatory Visit: Payer: Self-pay

## 2020-09-02 DIAGNOSIS — F1021 Alcohol dependence, in remission: Secondary | ICD-10-CM

## 2020-09-02 DIAGNOSIS — F332 Major depressive disorder, recurrent severe without psychotic features: Secondary | ICD-10-CM

## 2020-09-02 MED ORDER — SULFAMETHOXAZOLE-TRIMETHOPRIM 800-160 MG PO TABS: 1.0000 | ORAL_TABLET | Freq: Two times a day (BID) | ORAL | 0 refills | Status: DC

## 2020-09-02 NOTE — Progress Notes (Signed)
Mental Health Therapist Progress Note   Name: Gabrielle Mester  Total time: 60  Type of Service: Individual Outpatient Mental Health Therapy  OBJECTIVE:  Mood: Euthymic and Affect: Appropriate Risk of harm to self or others: No plan to harm self or others  DIAGNOSIS: MDD, Recurrent, Moderate Alcohol Use Disorder, Severe, in Early Remission  GOALS ADDRESSED:  Patient will:  Reduce symptoms of: depression   Increase knowledge and/or ability of: coping skills   Demonstrate ability to: Decrease self-medicating behaviors  INTERVENTIONS: Interventions utilized:  Supportive Counseling Therapist met with patient for outpatient mental health individual therapy to include ongoing assessment, support, and reinforcement.  Therapist allowed patient to "check in" since previous session; asking patient to share any positive coping skills they may have used over the previous week, along with any challenges faced.  Therapist provided supportive listening as patient processed their thoughts, emotional responses, and behaviors surrounding several stressors. Client and therapist explored his feelings regarding his recent choice of a sponsor in [place]. Client reports feelings positively about his choice and feels supported. Client is feeling empowered and positive about his recovery. Therapist encouraged healthy expression of feelings within his recovery network.   EFFECTIVENESS/PLAN: Client was alert, oriented x3, with no SI, HI, or symptoms of psychosis (risk low).  Client was pleasant and friendly, engaging openly and appropriately with therapist, benefiting from supportive listening and exploration of feelings.  Next session recommended in one to two weeks.   Hughes Better, LCSW

## 2020-09-02 NOTE — Telephone Encounter (Signed)
Prescription for Bactrim sent to the pharmacy. Continue with warm compresses. If not successful may need to have incised and drained.  Terri Piedra, NP 09/02/2020 5:06 PM

## 2020-09-02 NOTE — Addendum Note (Signed)
Addended by: Mauricio Po D on: 09/02/2020 05:07 PM   Modules accepted: Orders

## 2020-09-02 NOTE — Telephone Encounter (Signed)
Patient here for appointment with Marcie Bal, spoke with RN at check out desk. He would like to see if Marya Amsler can give him advice or treatment for a cyst on his nose, stating that he saw/spoke with Marya Amsler about it on "Tuesday" and the warm compresses Marya Amsler advised are not helping at all.  RN unable to find any documentation about this previous conversation. RN asked patient about primary care (none), urgent care (uninsured), or the possibility of other referrals (no insurance). Will route to [facility] for advice with Jinny Blossom to follow.  Landis Gandy, RN

## 2020-09-03 NOTE — Telephone Encounter (Signed)
Have attempted to reach patient at facility multiple times, have been unsuccessful in reaching anyone. Line rings and then is busy.   Beryle Flock, RN

## 2020-09-03 NOTE — Telephone Encounter (Signed)
Spoke with patient, advised him Marya Amsler has sent Bactrim to his pharmacy and that he should continue with the warm compresses and let us know if the cyst worsens. Patient verbalized understanding and has no further questions.   Beryle Flock, RN

## 2020-09-16 ENCOUNTER — Ambulatory Visit: Payer: Self-pay

## 2020-09-16 ENCOUNTER — Other Ambulatory Visit: Payer: Self-pay

## 2020-09-30 ENCOUNTER — Other Ambulatory Visit: Payer: Self-pay

## 2020-09-30 ENCOUNTER — Ambulatory Visit: Payer: Self-pay

## 2020-10-14 ENCOUNTER — Other Ambulatory Visit: Payer: Self-pay

## 2020-10-19 ENCOUNTER — Other Ambulatory Visit: Payer: Self-pay

## 2020-10-19 ENCOUNTER — Ambulatory Visit: Payer: Self-pay

## 2020-10-19 DIAGNOSIS — B20 Human immunodeficiency virus [HIV] disease: Secondary | ICD-10-CM

## 2020-10-20 LAB — T-HELPER CELL (CD4) - (RCID CLINIC ONLY)
CD4 % Helper T Cell: 40 % (ref 33–65)
CD4 T Cell Abs: 839 /uL (ref 400–1790)

## 2020-10-21 LAB — HIV-1 RNA QUANT-NO REFLEX-BLD
HIV 1 RNA Quant: NOT DETECTED Copies/mL
HIV-1 RNA Quant, Log: NOT DETECTED Log cps/mL

## 2020-10-27 ENCOUNTER — Other Ambulatory Visit: Payer: Self-pay

## 2020-10-27 ENCOUNTER — Encounter: Payer: Self-pay | Admitting: Family

## 2020-10-27 ENCOUNTER — Other Ambulatory Visit: Payer: Self-pay | Admitting: Family

## 2020-10-27 ENCOUNTER — Ambulatory Visit (INDEPENDENT_AMBULATORY_CARE_PROVIDER_SITE_OTHER): Payer: Self-pay | Admitting: Family

## 2020-10-27 VITALS — BP 110/75 | HR 77 | Temp 98.2°F | Wt 153.8 lb

## 2020-10-27 DIAGNOSIS — Z0000 Encounter for general adult medical examination without abnormal findings: Secondary | ICD-10-CM

## 2020-10-27 DIAGNOSIS — B20 Human immunodeficiency virus [HIV] disease: Secondary | ICD-10-CM

## 2020-10-27 DIAGNOSIS — F1021 Alcohol dependence, in remission: Secondary | ICD-10-CM

## 2020-10-27 DIAGNOSIS — Z23 Encounter for immunization: Secondary | ICD-10-CM

## 2020-10-27 MED ORDER — TRIAMCINOLONE ACETONIDE 0.1 % EX CREA: 1.0000 "application " | TOPICAL_CREAM | Freq: Two times a day (BID) | CUTANEOUS | 0 refills | Status: DC

## 2020-10-27 MED ORDER — TRIUMEQ 600-50-300 MG PO TABS: 1.0000 | ORAL_TABLET | Freq: Every day | ORAL | 5 refills | Status: DC

## 2020-10-27 NOTE — Telephone Encounter (Signed)
Appt 8/17 
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
## 2020-10-27 NOTE — Patient Instructions (Addendum)
Nice to see you.  Continue to take your Triumeq daily as prescribed.   Refills have been sent to the pharmacy.  Good luck with your new job.   Plan for follow up in 4 months or sooner if needed with lab work on the same day.  Have a great day and stay safe!

## 2020-10-28 ENCOUNTER — Encounter: Payer: Self-pay | Admitting: Family

## 2020-10-28 NOTE — Assessment & Plan Note (Deleted)
Arthur Lynch continues to have well-controlled virus with good adherence and tolerance to his ART regimen of Triumeq.  No signs/symptoms of opportunistic infection or progressive HIV.  We reviewed lab work and discussed plan of care.  Continue current dose of Triumeq.  Plan for follow-up in 4 months or sooner if needed with lab work 1 to 2 weeks prior to appointment.

## 2020-10-28 NOTE — Assessment & Plan Note (Signed)
   Discussed importance of safe sexual practice to reduce risk of STI.  Condoms offered.  Routine vaccinations up-to-date per recommendations.

## 2020-10-28 NOTE — Assessment & Plan Note (Signed)
Mr. Odonald continues to have well-controlled virus with good adherence and tolerance to his ART regimen of Triumeq.  No signs/symptoms of opportunistic infection or progressive HIV.  We reviewed lab work and discussed plan of care.  Continue current dose of Triumeq.  Plan for follow-up in 4 months or sooner if needed with lab work 1 to 2 weeks prior to appointment.

## 2020-10-28 NOTE — Assessment & Plan Note (Signed)
Arthur Lynch continues to remain in remission from alcoholism and doing well.  Getting ready to start a full-time job and continues to remain in rehabilitation.  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

## 2020-11-01 ENCOUNTER — Encounter: Payer: Self-pay | Admitting: Family

## 2020-11-02 ENCOUNTER — Ambulatory Visit: Payer: Self-pay

## 2020-11-09 ENCOUNTER — Ambulatory Visit: Payer: Self-pay

## 2020-11-25 ENCOUNTER — Other Ambulatory Visit: Payer: Self-pay

## 2020-11-25 ENCOUNTER — Ambulatory Visit: Payer: Self-pay

## 2020-12-09 ENCOUNTER — Telehealth: Payer: Self-pay | Admitting: Gerontology

## 2020-12-09 ENCOUNTER — Telehealth: Payer: Self-pay | Admitting: Internal Medicine

## 2020-12-09 NOTE — Telephone Encounter (Signed)
Called to schedule appt with Dr. Mable Fill, but he did not answer. Mailbox was not set up.  Should be called at a later time

## 2020-12-14 NOTE — Telephone Encounter (Signed)
-----   Message from Langston Reusing, NP sent at 12/09/2020  8:31 AM EDT ----- Pls schedule an appointment for Mr Dunnavant with Dr Mable Fill in the next month, make a telephone note. Thank you

## 2020-12-28 ENCOUNTER — Other Ambulatory Visit: Payer: Self-pay

## 2020-12-28 ENCOUNTER — Ambulatory Visit: Payer: Self-pay

## 2021-01-16 DIAGNOSIS — Z21 Asymptomatic human immunodeficiency virus [HIV] infection status: Secondary | ICD-10-CM | POA: Diagnosis present

## 2021-01-16 DIAGNOSIS — M65141 Other infective (teno)synovitis, right hand: Principal | ICD-10-CM | POA: Diagnosis present

## 2021-01-16 DIAGNOSIS — M659 Synovitis and tenosynovitis, unspecified: Secondary | ICD-10-CM | POA: Diagnosis not present

## 2021-01-16 DIAGNOSIS — Z79899 Other long term (current) drug therapy: Secondary | ICD-10-CM

## 2021-01-16 DIAGNOSIS — J449 Chronic obstructive pulmonary disease, unspecified: Secondary | ICD-10-CM | POA: Diagnosis present

## 2021-01-16 DIAGNOSIS — S61210A Laceration without foreign body of right index finger without damage to nail, initial encounter: Secondary | ICD-10-CM | POA: Diagnosis present

## 2021-01-16 DIAGNOSIS — W260XXA Contact with knife, initial encounter: Secondary | ICD-10-CM | POA: Diagnosis present

## 2021-01-16 DIAGNOSIS — Z20822 Contact with and (suspected) exposure to covid-19: Secondary | ICD-10-CM | POA: Diagnosis present

## 2021-01-16 DIAGNOSIS — L03011 Cellulitis of right finger: Secondary | ICD-10-CM | POA: Diagnosis present

## 2021-01-16 DIAGNOSIS — F32A Depression, unspecified: Secondary | ICD-10-CM | POA: Diagnosis present

## 2021-01-16 DIAGNOSIS — F1021 Alcohol dependence, in remission: Secondary | ICD-10-CM | POA: Diagnosis present

## 2021-01-16 DIAGNOSIS — N179 Acute kidney failure, unspecified: Secondary | ICD-10-CM | POA: Diagnosis not present

## 2021-01-16 DIAGNOSIS — Z23 Encounter for immunization: Secondary | ICD-10-CM

## 2021-01-16 NOTE — ED Notes (Signed)
Called for patient in [place].

## 2021-01-16 NOTE — ED Triage Notes (Signed)
Pt to ED via POV with c/o right index finger injury. 2 weeks ago he was cutting plastic and looked away, sliced his finger with an exacto knife. From finger to tip to his knuckle is red and swollen.

## 2021-01-16 NOTE — ED Provider Notes (Signed)
Emergency Medicine Provider Triage Evaluation Note  Harvin Konicek , a 60 y.o. male  was evaluated in triage.  Pt complains of pain and swelling of left index finger. About 2 weeks ago he cut his finger with a utility knife. He thought it was healing until 2-3 days ago and noticed swelling.  Review of Systems  Positive: Finger pain Negative: Fever  Physical Exam  There were no vitals taken for this visit. Gen:   Awake, no distress   Resp:  Normal effort  MSK:   Moves extremities without difficulty  Other:    Medical Decision Making  Medically screening exam initiated at 4:58 PM.  Appropriate orders placed.  Miguel Aschoff was informed that the remainder of the evaluation will be completed by another provider, this initial triage assessment does not replace that evaluation, and the importance of remaining in the ED until their evaluation is complete.   Victorino Dike, FNP 01/16/21 1701    Merlyn Lot, MD 01/16/21 1906

## 2021-01-17 ENCOUNTER — Encounter: Payer: Self-pay | Admitting: Family Medicine

## 2021-01-17 ENCOUNTER — Inpatient Hospital Stay
Admission: EM | Admit: 2021-01-17 | Discharge: 2021-01-19 | DRG: 558 | Disposition: A | Discharge status: Home / Self Care | Payer: Worker's Compensation | Attending: Internal Medicine | Admitting: Internal Medicine

## 2021-01-17 ENCOUNTER — Other Ambulatory Visit: Payer: Self-pay

## 2021-01-17 ENCOUNTER — Inpatient Hospital Stay: Payer: Worker's Compensation

## 2021-01-17 DIAGNOSIS — F1021 Alcohol dependence, in remission: Secondary | ICD-10-CM | POA: Diagnosis present

## 2021-01-17 DIAGNOSIS — W260XXA Contact with knife, initial encounter: Secondary | ICD-10-CM | POA: Diagnosis present

## 2021-01-17 DIAGNOSIS — N179 Acute kidney failure, unspecified: Secondary | ICD-10-CM | POA: Diagnosis not present

## 2021-01-17 DIAGNOSIS — M659 Synovitis and tenosynovitis, unspecified: Secondary | ICD-10-CM

## 2021-01-17 DIAGNOSIS — M6530 Trigger finger, unspecified finger: Secondary | ICD-10-CM

## 2021-01-17 DIAGNOSIS — L03011 Cellulitis of right finger: Secondary | ICD-10-CM | POA: Diagnosis present

## 2021-01-17 DIAGNOSIS — S61210A Laceration without foreign body of right index finger without damage to nail, initial encounter: Secondary | ICD-10-CM | POA: Diagnosis present

## 2021-01-17 DIAGNOSIS — L089 Local infection of the skin and subcutaneous tissue, unspecified: Secondary | ICD-10-CM

## 2021-01-17 DIAGNOSIS — L03019 Cellulitis of unspecified finger: Secondary | ICD-10-CM | POA: Diagnosis not present

## 2021-01-17 DIAGNOSIS — Z20822 Contact with and (suspected) exposure to covid-19: Secondary | ICD-10-CM | POA: Diagnosis present

## 2021-01-17 DIAGNOSIS — J449 Chronic obstructive pulmonary disease, unspecified: Secondary | ICD-10-CM | POA: Diagnosis present

## 2021-01-17 DIAGNOSIS — F32A Depression, unspecified: Secondary | ICD-10-CM | POA: Diagnosis present

## 2021-01-17 DIAGNOSIS — Z79899 Other long term (current) drug therapy: Secondary | ICD-10-CM | POA: Diagnosis not present

## 2021-01-17 DIAGNOSIS — M65141 Other infective (teno)synovitis, right hand: Secondary | ICD-10-CM | POA: Diagnosis present

## 2021-01-17 DIAGNOSIS — R52 Pain, unspecified: Secondary | ICD-10-CM

## 2021-01-17 DIAGNOSIS — Z23 Encounter for immunization: Secondary | ICD-10-CM | POA: Diagnosis not present

## 2021-01-17 DIAGNOSIS — B20 Human immunodeficiency virus [HIV] disease: Secondary | ICD-10-CM | POA: Diagnosis not present

## 2021-01-17 DIAGNOSIS — Z21 Asymptomatic human immunodeficiency virus [HIV] infection status: Secondary | ICD-10-CM | POA: Diagnosis present

## 2021-01-17 LAB — CBC
HCT: 35.2 % — ABNORMAL LOW (ref 39.0–52.0)
Hemoglobin: 11.4 g/dL — ABNORMAL LOW (ref 13.0–17.0)
MCH: 32.6 pg (ref 26.0–34.0)
MCHC: 32.4 g/dL (ref 30.0–36.0)
MCV: 100.6 fL — ABNORMAL HIGH (ref 80.0–100.0)
Platelets: 339 10*3/uL (ref 150–400)
RBC: 3.5 MIL/uL — ABNORMAL LOW (ref 4.22–5.81)
RDW: 12.3 % (ref 11.5–15.5)
WBC: 8.2 10*3/uL (ref 4.0–10.5)
nRBC: 0 % (ref 0.0–0.2)

## 2021-01-17 LAB — HEPATIC FUNCTION PANEL
ALT: 13 U/L (ref 0–44)
AST: 22 U/L (ref 15–41)
Albumin: 3.7 g/dL (ref 3.5–5.0)
Alkaline Phosphatase: 87 U/L (ref 38–126)
Bilirubin, Direct: <0.1 mg/dL (ref 0.0–0.2)
Total Bilirubin: 0.8 mg/dL (ref 0.3–1.2)
Total Protein: 8.2 g/dL — ABNORMAL HIGH (ref 6.5–8.1)

## 2021-01-17 LAB — BASIC METABOLIC PANEL
Anion gap: 8 (ref 5–15)
Anion gap: 8 (ref 5–15)
BUN: 11 mg/dL (ref 6–20)
BUN: 10 mg/dL (ref 6–20)
CO2: 25 mmol/L (ref 22–32)
CO2: 27 mmol/L (ref 22–32)
Calcium: 9.1 mg/dL (ref 8.9–10.3)
Calcium: 9.1 mg/dL (ref 8.9–10.3)
Chloride: 103 mmol/L (ref 98–111)
Chloride: 103 mmol/L (ref 98–111)
Creatinine, Ser: 1 mg/dL (ref 0.61–1.24)
Creatinine, Ser: 1.12 mg/dL (ref 0.61–1.24)
GFR, Estimated: >60 mL/min (ref >60)
GFR, Estimated: >60 mL/min (ref >60)
Glucose, Bld: 139 mg/dL — ABNORMAL HIGH (ref 70–99)
Glucose, Bld: 107 mg/dL — ABNORMAL HIGH (ref 70–99)
Potassium: 3.9 mmol/L (ref 3.5–5.1)
Potassium: 3.8 mmol/L (ref 3.5–5.1)
Sodium: 136 mmol/L (ref 135–145)
Sodium: 138 mmol/L (ref 135–145)

## 2021-01-17 LAB — CBC WITH DIFFERENTIAL/PLATELET
Abs Immature Granulocytes: 0.01 10*3/uL (ref 0.00–0.07)
Basophils Absolute: 0 10*3/uL (ref 0.0–0.1)
Basophils Relative: 0 %
Eosinophils Absolute: 0 10*3/uL (ref 0.0–0.5)
Eosinophils Relative: 0 %
HCT: 35.1 % — ABNORMAL LOW (ref 39.0–52.0)
Hemoglobin: 11.8 g/dL — ABNORMAL LOW (ref 13.0–17.0)
Immature Granulocytes: 0 %
Lymphocytes Relative: 23 %
Lymphs Abs: 1.5 10*3/uL (ref 0.7–4.0)
MCH: 33.3 pg (ref 26.0–34.0)
MCHC: 33.6 g/dL (ref 30.0–36.0)
MCV: 99.2 fL (ref 80.0–100.0)
Monocytes Absolute: 0.4 10*3/uL (ref 0.1–1.0)
Monocytes Relative: 6 %
Neutro Abs: 4.8 10*3/uL (ref 1.7–7.7)
Neutrophils Relative %: 71 %
Platelets: 330 10*3/uL (ref 150–400)
RBC: 3.54 MIL/uL — ABNORMAL LOW (ref 4.22–5.81)
RDW: 12.1 % (ref 11.5–15.5)
WBC: 6.8 10*3/uL (ref 4.0–10.5)
nRBC: 0 % (ref 0.0–0.2)

## 2021-01-17 LAB — PROTIME-INR
INR: 1 (ref 0.8–1.2)
Prothrombin Time: 13.1 seconds (ref 11.4–15.2)

## 2021-01-17 LAB — RESP PANEL BY RT-PCR (FLU A&B, COVID) ARPGX2
Influenza A by PCR: NEGATIVE
Influenza B by PCR: NEGATIVE
SARS Coronavirus 2 by RT PCR: NEGATIVE

## 2021-01-17 LAB — SEDIMENTATION RATE: Sed Rate: 59 mm/hr — ABNORMAL HIGH (ref 0–20)

## 2021-01-17 LAB — TYPE AND SCREEN
ABO/RH(D): A POS
Antibody Screen: NEGATIVE

## 2021-01-17 LAB — C-REACTIVE PROTEIN: CRP: 4.5 mg/dL — ABNORMAL HIGH (ref <1.0)

## 2021-01-17 MED ORDER — SODIUM CHLORIDE 0.9 % IV SOLN: 2.0000 g | Freq: Once | INTRAVENOUS | Status: DC

## 2021-01-17 MED ORDER — ONDANSETRON HCL 4 MG/2ML IJ SOLN
4.0000 mg | Freq: Once | INTRAMUSCULAR | Status: AC
  Administered 2021-01-17: 4 mg via INTRAVENOUS
  Filled 2021-01-17: qty 2

## 2021-01-17 MED ORDER — SODIUM CHLORIDE 0.9 % IV SOLN
2.0000 g | Freq: Once | INTRAVENOUS | Status: AC
  Administered 2021-01-17: 2 g via INTRAVENOUS
  Filled 2021-01-17: qty 2

## 2021-01-17 MED ORDER — ONDANSETRON HCL 4 MG PO TABS: 4.0000 mg | ORAL_TABLET | Freq: Four times a day (QID) | ORAL | Status: DC | PRN

## 2021-01-17 MED ORDER — ONDANSETRON HCL 4 MG/2ML IJ SOLN: 4.0000 mg | Freq: Four times a day (QID) | INTRAMUSCULAR | Status: DC | PRN

## 2021-01-17 MED ORDER — TRAZODONE HCL 50 MG PO TABS: 25.0000 mg | ORAL_TABLET | Freq: Every evening | ORAL | Status: DC | PRN

## 2021-01-17 MED ORDER — POLYETHYLENE GLYCOL 3350 17 G PO PACK: 17.0000 g | PACK | Freq: Every day | ORAL | Status: DC | PRN

## 2021-01-17 MED ORDER — ABACAVIR-DOLUTEGRAVIR-LAMIVUD 600-50-300 MG PO TABS
1.0000 | ORAL_TABLET | Freq: Every day | ORAL | Status: DC
  Administered 2021-01-17 – 2021-01-19 (×3): 1 via ORAL
  Filled 2021-01-17 (×3): qty 1

## 2021-01-17 MED ORDER — FENTANYL CITRATE PF 50 MCG/ML IJ SOSY
50.0000 ug | PREFILLED_SYRINGE | Freq: Once | INTRAMUSCULAR | Status: AC
  Administered 2021-01-17: 50 ug via INTRAVENOUS
  Filled 2021-01-17: qty 1

## 2021-01-17 MED ORDER — ACETAMINOPHEN 650 MG RE SUPP: 650.0000 mg | Freq: Four times a day (QID) | RECTAL | Status: DC | PRN

## 2021-01-17 MED ORDER — MORPHINE SULFATE (PF) 2 MG/ML IV SOLN: 2.0000 mg | INTRAVENOUS | Status: DC | PRN

## 2021-01-17 MED ORDER — VANCOMYCIN HCL 1750 MG/350ML IV SOLN
1750.0000 mg | INTRAVENOUS | Status: DC
  Administered 2021-01-17 – 2021-01-18 (×2): 1750 mg via INTRAVENOUS
  Filled 2021-01-17 (×2): qty 350

## 2021-01-17 MED ORDER — ACETAMINOPHEN 325 MG PO TABS
650.0000 mg | ORAL_TABLET | Freq: Four times a day (QID) | ORAL | Status: DC | PRN
  Administered 2021-01-17: 650 mg via ORAL
  Filled 2021-01-17: qty 2

## 2021-01-17 MED ORDER — SODIUM CHLORIDE 0.9 % IV SOLN
2.0000 g | Freq: Three times a day (TID) | INTRAVENOUS | Status: DC
  Administered 2021-01-17 – 2021-01-19 (×7): 2 g via INTRAVENOUS
  Filled 2021-01-17 (×10): qty 2

## 2021-01-17 MED ORDER — VANCOMYCIN HCL IN DEXTROSE 1-5 GM/200ML-% IV SOLN: 1000.0000 mg | Freq: Once | INTRAVENOUS | Status: DC

## 2021-01-17 MED ORDER — OXYCODONE HCL 5 MG PO TABS
5.0000 mg | ORAL_TABLET | ORAL | Status: DC | PRN
  Administered 2021-01-17: 5 mg via ORAL
  Filled 2021-01-17: qty 1

## 2021-01-17 MED ORDER — SODIUM CHLORIDE 0.9 % IV SOLN: INTRAVENOUS | Status: DC

## 2021-01-17 MED ORDER — KETOROLAC TROMETHAMINE 15 MG/ML IJ SOLN
15.0000 mg | Freq: Four times a day (QID) | INTRAMUSCULAR | Status: DC
  Administered 2021-01-17 – 2021-01-18 (×4): 15 mg via INTRAVENOUS
  Filled 2021-01-17 (×4): qty 1

## 2021-01-17 MED ORDER — MORPHINE SULFATE (PF) 2 MG/ML IV SOLN
2.0000 mg | INTRAVENOUS | Status: DC | PRN
  Administered 2021-01-17: 2 mg via INTRAVENOUS
  Filled 2021-01-17: qty 1

## 2021-01-17 MED ORDER — ENOXAPARIN SODIUM 40 MG/0.4ML IJ SOSY
40.0000 mg | PREFILLED_SYRINGE | INTRAMUSCULAR | Status: DC
  Administered 2021-01-17 – 2021-01-19 (×3): 40 mg via SUBCUTANEOUS
  Filled 2021-01-17 (×3): qty 0.4

## 2021-01-17 MED ORDER — AMITRIPTYLINE HCL 25 MG PO TABS
50.0000 mg | ORAL_TABLET | Freq: Every day | ORAL | Status: DC
  Administered 2021-01-17 – 2021-01-18 (×2): 50 mg via ORAL
  Filled 2021-01-17 (×2): qty 2

## 2021-01-17 MED ORDER — VANCOMYCIN HCL IN DEXTROSE 1-5 GM/200ML-% IV SOLN
1000.0000 mg | Freq: Once | INTRAVENOUS | Status: AC
  Administered 2021-01-17: 1000 mg via INTRAVENOUS
  Filled 2021-01-17: qty 200

## 2021-01-17 NOTE — H&P (Addendum)
[place]   PATIENT NAME: Arthur Lynch    MR#:  [id number]  DATE OF BIRTH:  [birth year]  DATE OF ADMISSION:  01/17/2021  PRIMARY CARE PHYSICIAN: Arthur Lynch, Arthur Islam, MD   Patient is coming from: Home  REQUESTING/REFERRING PHYSICIAN: Ward, Delice Bison, DO  CHIEF COMPLAINT:   Chief Complaint  Patient presents with  . Finger Injury    HISTORY OF PRESENT ILLNESS:  Arthur Lynch is a 60 y.o. African-American male with medical history significant for COPD, HIV on antiretroviral therapy, depression and substance abuse, who presented to the emergency room with acute onset of right index swelling and pain which has been throbbing this evening.  He had normal CD4 count and undetectable viral HIV load in April and August 2022.  No fever or chills.  He stated that about a couple weeks ago he had an accidental injury and cut his finger with a utility knife at work while opening a box.  He then had a laceration to his distal right index finger.  No chest pain or dyspnea or cough or wheezing.  No nausea or vomiting or abdominal pain.  No bleeding diathesis.  He has been compliant with his antiretroviral therapy.  ED Course: Upon presentation to the emergency room, vital signs were within normal.  Labs were revealed mild anemia with hemoglobin of 11.8 hematocrit 35.1.  BMP was within normal.  Influenza antigens and COVID-19 PCR came back negative.  The patient was given 50 mcg of IV fentanyl, 4 mg of IV Zofran as well as IV vancomycin and cefepime.  Arthur Lynch was contacted about the patient and is aware.  The patient will be admitted to a medical bed for further evaluation and management. PAST MEDICAL HISTORY:   Past Medical History:  Diagnosis Date  . Abnormal Pap smear   . Alcoholism in remission ([place]) 08/16/2015  . Anemia   . Atypical chest pain 10/06/2015  . Cancer ([place])   . COPD (chronic obstructive pulmonary disease) ([place])   . Depression   . Eczema 10/06/2015  . HIV (human  immunodeficiency virus infection) ([place])   . Murmur, heart   . Neuromuscular disorder ([place])   . Substance abuse ([place])     PAST SURGICAL HISTORY:   Past Surgical History:  Procedure Laterality Date  . ANOSCOPY      SOCIAL HISTORY:   Social History   Tobacco Use  . Smoking status: Never  . Smokeless tobacco: Never  . Tobacco comments:    Given condoms  Substance Use Topics  . Alcohol use: Not Currently    Alcohol/week: 45.0 standard drinks    Types: 45 Standard drinks or equivalent per week    Comment: in residential program    FAMILY HISTORY:   Family History  Problem Relation Age of Onset  . Breast cancer Neg Hx     DRUG ALLERGIES:   Allergies  Allergen Reactions  . Penicillins Itching    Has patient had a PCN reaction causing immediate rash, facial/tongue/throat swelling, SOB or lightheadedness with hypotension: No Has patient had a PCN reaction causing severe rash involving mucus membranes or skin necrosis: No Has patient had a PCN reaction that required hospitalization No Has patient had a PCN reaction occurring within the last 10 years: No If all of the above answers are "NO", then may proceed with Cephalosporin use.     REVIEW OF SYSTEMS:   ROS As per history of present illness. All pertinent systems were  reviewed above. Constitutional, HEENT, cardiovascular, respiratory, GI, GU, musculoskeletal, neuro, psychiatric, endocrine, integumentary and hematologic systems were reviewed and are otherwise negative/unremarkable except for positive findings mentioned above in the HPI.   MEDICATIONS AT HOME:   Prior to Admission medications   Medication Sig Start Date End Date Taking? Authorizing Provider  abacavir-dolutegravir-lamiVUDine (TRIUMEQ) 600-50-300 MG tablet Take 1 tablet by mouth daily. 10/27/20  Yes Arthur Circle, FNP  amitriptyline (ELAVIL) 50 MG tablet Take 1 tablet (50 mg total) by mouth at bedtime. 05/06/20  Yes Arthur Circle, FNP   triamcinolone cream (KENALOG) 0.1 % Apply 1 application topically 2 (two) times daily. Patient not taking: Reported on 01/17/2021 10/27/20   Arthur Circle, FNP      VITAL SIGNS:  Blood pressure 123/80, pulse 84, temperature 98.4 F (36.9 C), temperature source Oral, resp. rate 18, height 6\' 1"  (1.854 m), weight 69.8 kg, SpO2 96 %.  PHYSICAL EXAMINATION:  Physical Exam  GENERAL:  60 y.o.-year-old Afro-American male patient lying in the bed with no acute distress.  EYES: Pupils equal, round, reactive to light and accommodation. No scleral icterus. Extraocular muscles intact.  HEENT: Head atraumatic, normocephalic. Oropharynx and nasopharynx clear.  NECK:  Supple, no jugular venous distention. No thyroid enlargement, no tenderness.  LUNGS: Normal breath sounds bilaterally, no wheezing, rales,rhonchi or crepitation. No use of accessory muscles of respiration.  CARDIOVASCULAR: Regular rate and rhythm, S1, S2 normal. No murmurs, rubs, or gallops.  ABDOMEN: Soft, nondistended, nontender. Bowel sounds present. No organomegaly or mass.  EXTREMITIES: No pedal edema, cyanosis, or clubbing.  NEUROLOGIC: Cranial nerves II through XII are intact. Muscle strength 5/5 in all extremities. Sensation intact. Gait not checked.  PSYCHIATRIC: The patient is alert and oriented x 3.  Normal affect and good eye contact. SKIN: Right distal index finger swelling with warmth, erythema and tenderness over the dorsal part and the pulp of the index with some fluctuation.  There was no drainage.  He has a healed laceration proximal to the nailbed.  He was unable to flex his finger and had tenderness over the flexor tendon.  Extension was normal.    LABORATORY PANEL:   CBC Recent Labs  Lab 01/17/21 0201  WBC 6.8  HGB 11.8*  HCT 35.1*  PLT 330   ------------------------------------------------------------------------------------------------------------------  Chemistries  Recent Labs  Lab 01/17/21 0201   NA 136  K 3.9  CL 103  CO2 25  GLUCOSE 139*  BUN 11  CREATININE 1.00  CALCIUM 9.1   ------------------------------------------------------------------------------------------------------------------  Cardiac Enzymes No results for input(s): TROPONINI in the last 168 hours. ------------------------------------------------------------------------------------------------------------------  RADIOLOGY:  No results found.    IMPRESSION AND PLAN:  Active Problems:   Stenosing tenosynovitis of finger  1.  Right index moderate severe cellulitis with suspected flexor tenosynovitis. - The patient be admitted to a medical bed. - Continue antibiotic therapy with IV vancomycin and cefepime. - Pain management will be provided. - Orthopedic consult will be obtained. - Arthur Lynch was notified about the patient. - We will keep the patient n.p.o.  2.  HIV on antiretroviral therapy with undetectable recent viral load. - We will continue Triumeq.  DVT prophylaxis: Lovenox. Code Status: full code. Family Communication:  The plan of care was discussed in details with the patient (and family). I answered all questions. The patient agreed to proceed with the above mentioned plan. Further management will depend upon hospital course. Disposition Plan: Back to previous home environment Consults called: Orthopedic consult.   All the  records are reviewed and case discussed with ED provider.  Status is: Inpatient   Remains inpatient appropriate because:Ongoing diagnostic testing needed not appropriate for outpatient work up, Unsafe d/c plan, IV treatments appropriate due to intensity of illness or inability to take PO, and Inpatient level of care appropriate due to severity of illness   Dispo: The patient is from: Home              Anticipated d/c is to: Home              Patient currently is not medically stable to d/c.              Difficult to place patient: No  TOTAL TIME TAKING CARE OF  THIS PATIENT: 50 minutes.     Christel Mormon M.D on 01/17/2021 at 3:17 AM  [facility]   From 7 PM-7 AM, contact night-coverage [email or web address]  CC: Primary care physician; Arthur Lynch, Arthur Islam, MD

## 2021-01-17 NOTE — TOC Progression Note (Signed)
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 Customer Service @ [phone].  TOC to follow         Expected Discharge Plan and Services                                                 Social Determinants of Health (SDOH) Interventions    Readmission Risk Interventions No flowsheet data found.

## 2021-01-17 NOTE — Consult Note (Signed)
NAME: Arthur Lynch  DOB: June 29, 1960  MRN: [id number]  Date/Time: 01/17/2021 12:51 PM  REQUESTING PROVIDER: Dr.Sreenath Subjective:  REASON FOR CONSULT: left index finger infection ? Arthur Lynch is a 60 y.o.male with a history of HIV disease on Rx and well controlled, migraine presents with swelling and throbbing left indext finger of 1 day duration Pt says 2 weeks ago at work when he was using a box cutter he accidentally cut his finger- He cleaned the finger and applied antibiotic cream and bandaid He did not pay much attention to the finger after that. He lives in a alcohol rehab place and shares a room with another person and a common bathroom for 4. HE says that premises is not clean all the time. He went to work on Friday- on Saturday the index finger started to throb and he came to the ED on Sunday He denies any fever He had some discharge a few days ago but none now In the ED temp 97.8, BP 110/75, PR 77 and sats 99% WBC 8.2, HB 11.4, plt 339 and cr 1.12. He was started on vanco and cefepime and ortho was consulted He had Xray of the finger and no osteo I am asked to see by his HIV provider at [facility] Pt is 100% adherent to HAART On Triumeq Last Vl < 20 and Cd4 is 839 from 10/19/20  Past Medical History:  Diagnosis Date   Abnormal Pap smear    Alcoholism in remission ([place]) 08/16/2015   Anemia    Atypical chest pain 10/06/2015   Cancer (HCC)    COPD (chronic obstructive pulmonary disease) ([place])    Depression    Eczema 10/06/2015   HIV (human immunodeficiency virus infection) ([place])    Murmur, heart    Neuromuscular disorder ([place])    Substance abuse ([place])     Past Surgical History:  Procedure Laterality Date   ANOSCOPY      Social History   Socioeconomic History   Marital status: Single    Spouse name: Not on file   Number of children: Not on file   Years of education: Not on file   Highest education level: Not on file  Occupational History   Not on file   Tobacco Use   Smoking status: Never   Smokeless tobacco: Never   Tobacco comments:    Given condoms  Substance and Sexual Activity   Alcohol use: Not Currently    Alcohol/week: 45.0 standard drinks    Types: 45 Standard drinks or equivalent per week    Comment: in residential program   Drug use: No   Sexual activity: Never    Partners: Male    Birth control/protection: Condom    Comment: declined condoms  Other Topics Concern   Not on file  Social History Narrative   Not on file   Social Determinants of Health   Financial Resource Strain: Not on file  Food Insecurity: Not on file  Transportation Needs: Not on file  Physical Activity: Not on file  Stress: Not on file  Social Connections: Not on file  Intimate Partner Violence: Not on file    Family History  Problem Relation Age of Onset   Breast cancer Neg Hx    Allergies  Allergen Reactions   Penicillins Itching    Has patient had a PCN reaction causing immediate rash, facial/tongue/throat swelling, SOB or lightheadedness with hypotension: No Has patient had a PCN reaction causing severe rash involving mucus membranes or skin  necrosis: No Has patient had a PCN reaction that required hospitalization No Has patient had a PCN reaction occurring within the last 10 years: No If all of the above answers are "NO", then may proceed with Cephalosporin use.    I? Current Facility-Administered Medications  Medication Dose Route Frequency Provider Last Rate Last Admin   0.9 %  sodium chloride infusion   Intravenous Continuous Mansy, Arvella Merles, MD 75 mL/hr at 01/17/21 0433 New Bag at 01/17/21 0433   abacavir-dolutegravir-lamiVUDine (TRIUMEQ) 600-50-300 MG per tablet 1 tablet  1 tablet Oral Daily Mansy, Jan A, MD   1 tablet at 01/17/21 0941   acetaminophen (TYLENOL) tablet 650 mg  650 mg Oral Q6H PRN Mansy, Jan A, MD   650 mg at 01/17/21 0440   Or   acetaminophen (TYLENOL) suppository 650 mg  650 mg Rectal Q6H PRN Mansy, Jan A, MD        amitriptyline (ELAVIL) tablet 50 mg  50 mg Oral QHS Mansy, Jan A, MD       ceFEPIme (MAXIPIME) 2 g in sodium chloride 0.9 % 100 mL IVPB  2 g Intravenous Q8H Belue, Alver Sorrow, RPH 200 mL/hr at 01/17/21 0935 2 g at 01/17/21 0935   enoxaparin (LOVENOX) injection 40 mg  40 mg Subcutaneous Q24H Mansy, Jan A, MD   40 mg at 01/17/21 0934   ketorolac (TORADOL) 15 MG/ML injection 15 mg  15 mg Intravenous Q6H Sreenath, Sudheer B, MD       morphine 2 MG/ML injection 2 mg  2 mg Intravenous Q3H PRN Ralene Muskrat B, MD       ondansetron (ZOFRAN) tablet 4 mg  4 mg Oral Q6H PRN Mansy, Jan A, MD       Or   ondansetron [facility]) injection 4 mg  4 mg Intravenous Q6H PRN Mansy, Jan A, MD       oxyCODONE (Oxy IR/ROXICODONE) immediate release tablet 5 mg  5 mg Oral Q4H PRN Priscella Mann, Sudheer B, MD   5 mg at 01/17/21 1012   polyethylene glycol (MIRALAX / GLYCOLAX) packet 17 g  17 g Oral Daily PRN Mansy, Jan A, MD       traZODone (DESYREL) tablet 25 mg  25 mg Oral QHS PRN Mansy, Jan A, MD       vancomycin (VANCOREADY) IVPB 1750 mg/350 mL  1,750 mg Intravenous Q24H Belue, Nathan S, RPH         Abtx:  Anti-infectives (From admission, onward)    Start     Dose/Rate Route Frequency Ordered Stop   01/17/21 1600  vancomycin (VANCOREADY) IVPB 1750 mg/350 mL        1,750 mg 175 mL/hr over 120 Minutes Intravenous Every 24 hours 01/17/21 0601 01/24/21 1559   01/17/21 1100  ceFEPIme (MAXIPIME) 2 g in sodium chloride 0.9 % 100 mL IVPB        2 g 200 mL/hr over 30 Minutes Intravenous Every 8 hours 01/17/21 0556 01/24/21 0559   01/17/21 1000  abacavir-dolutegravir-lamiVUDine (TRIUMEQ) 600-50-300 MG per tablet 1 tablet        1 tablet Oral Daily 01/17/21 0313     01/17/21 0315  vancomycin (VANCOCIN) IVPB 1000 mg/200 mL premix  Status:  Discontinued        1,000 mg 200 mL/hr over 60 Minutes Intravenous  Once 01/17/21 0313 01/17/21 0316   01/17/21 0315  ceFEPIme (MAXIPIME) 2 g in sodium chloride 0.9 % 100 mL IVPB   Status:  Discontinued  2 g 200 mL/hr over 30 Minutes Intravenous  Once 01/17/21 0313 01/17/21 0316   01/17/21 0200  vancomycin (VANCOCIN) IVPB 1000 mg/200 mL premix        1,000 mg 200 mL/hr over 60 Minutes Intravenous  Once 01/17/21 0149 01/17/21 0349   01/17/21 0200  ceFEPIme (MAXIPIME) 2 g in sodium chloride 0.9 % 100 mL IVPB        2 g 200 mL/hr over 30 Minutes Intravenous  Once 01/17/21 0149 01/17/21 0241       REVIEW OF SYSTEMS:  Const: negative fever, negative chills, negative weight loss Eyes: negative diplopia or visual changes, negative eye pain ENT: negative coryza, negative sore throat Resp: negative cough, hemoptysis, dyspnea Cards: negative for chest pain, palpitations, lower extremity edema GU: negative for frequency, dysuria and hematuria GI: Negative for abdominal pain, diarrhea, bleeding, constipation Skin: negative for rash and pruritus Heme: negative for easy bruising and gum/nose bleeding MS: negative for myalgias, arthralgias, back pain and muscle weakness Neurolo:negative for headaches, dizziness, vertigo, memory problems  Psych: negative for feelings of anxiety, depression  Endocrine: negative for thyroid, diabetes Allergy/Immunology- PCN- rash 40 yrs ago- but tolerates amoxicillin: Objective:  VITALS:  BP 117/85 (BP Location: Left Arm)   Pulse 87   Temp 98.4 F (36.9 C)   Resp 14   Ht 6\' 1"  (1.854 m)   Wt 69.8 kg   SpO2 100%   BMI 20.30 kg/m  PHYSICAL EXAM:  General: Alert, cooperative, no distress, appears stated age.  Head: Normocephalic, without obvious abnormality, atraumatic. Eyes: Conjunctivae clear, anicteric sclerae. Pupils are equal ENT Nares normal. No drainage or sinus tenderness. Lips, mucosa, and tongue normal. No Thrush Neck: Supple, symmetrical, no adenopathy, thyroid: non tender no carotid bruit and no JVD. Back: No CVA tenderness. Lungs: Clear to auscultation bilaterally. No Wheezing or Rhonchi. No rales. Heart: Regular  rate and rhythm, no murmur, rub or gallop. Abdomen: Soft, non-tender,not distended. Bowel sounds normal. No masses Extremities: left index finger tip swollen tender to touch and turgid     Skin: No rashes or lesions. Or bruising Lymph: Cervical, supraclavicular normal. Neurologic: Grossly non-focal Pertinent Labs Lab Results CBC    Component Value Date/Time   WBC 8.2 01/17/2021 0458   RBC 3.50 (L) 01/17/2021 0458   HGB 11.4 (L) 01/17/2021 0458   HGB 12.2 (L) 06/16/2020 1050   HGB 12.4 (L) 04/08/2008 0905   HCT 35.2 (L) 01/17/2021 0458   HCT 35.6 (L) 06/16/2020 1050   HCT 36.1 (L) 04/08/2008 0905   PLT 339 01/17/2021 0458   PLT 333 06/16/2020 1050   MCV 100.6 (H) 01/17/2021 0458   MCV 100 (H) 06/16/2020 1050   MCV 100.3 (H) 04/08/2008 0905   MCH 32.6 01/17/2021 0458   MCHC 32.4 01/17/2021 0458   RDW 12.3 01/17/2021 0458   RDW 11.5 (L) 06/16/2020 1050   RDW 13.2 04/08/2008 0905   LYMPHSABS 1.5 01/17/2021 0201   LYMPHSABS 2.3 06/16/2020 1050   LYMPHSABS 1.4 04/08/2008 0905   MONOABS 0.4 01/17/2021 0201   MONOABS 0.3 04/08/2008 0905   EOSABS 0.0 01/17/2021 0201   EOSABS 0.0 06/16/2020 1050   BASOSABS 0.0 01/17/2021 0201   BASOSABS 0.0 06/16/2020 1050   BASOSABS 0.0 04/08/2008 0905    CMP Latest Ref Rng & Units 01/17/2021 01/17/2021 06/16/2020  Glucose 70 - 99 mg/dL 107(H) 139(H) 84  BUN 6 - 20 mg/dL 10 11 15   Creatinine 0.61 - 1.24 mg/dL 1.12 1.00 1.25  Sodium 135 -  145 mmol/L 138 136 138  Potassium 3.5 - 5.1 mmol/L 3.8 3.9 4.5  Chloride 98 - 111 mmol/L 103 103 100  CO2 22 - 32 mmol/L 27 25 23   Calcium 8.9 - 10.3 mg/dL 9.1 9.1 9.9  Total Protein 6.0 - 8.5 g/dL - - 7.8  Total Bilirubin 0.0 - 1.2 mg/dL - - 0.6  Alkaline Phos 44 - 121 IU/L - - 77  AST 0 - 40 IU/L - - 16  ALT 0 - 44 IU/L - - 8      Microbiology: Recent Results (from the past 240 hour(s))  Resp Panel by RT-PCR (Flu A&B, Covid) Nasopharyngeal Swab     Status: None   Collection Time: 01/17/21  2:21  AM   Specimen: Nasopharyngeal Swab; Nasopharyngeal(NP) swabs in vial transport medium  Result Value Ref Range Status   SARS Coronavirus 2 by RT PCR NEGATIVE NEGATIVE Final    Comment: (NOTE) SARS-CoV-2 target nucleic acids are NOT DETECTED.  The SARS-CoV-2 RNA is generally detectable in upper respiratory specimens during the acute phase of infection. The lowest concentration of SARS-CoV-2 viral copies this assay can detect is 138 copies/mL. A negative result does not preclude SARS-Cov-2 infection and should not be used as the sole basis for treatment or other patient management decisions. A negative result may occur with  improper specimen collection/handling, submission of specimen other than nasopharyngeal swab, presence of viral mutation(s) within the areas targeted by this assay, and inadequate number of viral copies(<138 copies/mL). A negative result must be combined with clinical observations, patient history, and epidemiological information. The expected result is Negative.  Fact Sheet for Patients:  EntrepreneurPulse.com.au  Fact Sheet for Healthcare Providers:  IncredibleEmployment.be  This test is no t yet approved or cleared by the [place] FDA and  has been authorized for detection and/or diagnosis of SARS-CoV-2 by FDA under an Emergency Use Authorization (EUA). This EUA will remain  in effect (meaning this test can be used) for the duration of the COVID-19 declaration under Section 564(b)(1) of the Act, 21 U.S.C.section 360bbb-3(b)(1), unless the authorization is terminated  or revoked sooner.       Influenza A by PCR NEGATIVE NEGATIVE Final   Influenza B by PCR NEGATIVE NEGATIVE Final    Comment: (NOTE) The Xpert Xpress SARS-CoV-2/FLU/RSV plus assay is intended as an aid in the diagnosis of influenza from Nasopharyngeal swab specimens and should not be used as a sole basis for treatment. Nasal washings and aspirates are  unacceptable for Xpert Xpress SARS-CoV-2/FLU/RSV testing.  Fact Sheet for Patients: EntrepreneurPulse.com.au  Fact Sheet for Healthcare Providers: IncredibleEmployment.be  This test is not yet approved or cleared by the [place] FDA and has been authorized for detection and/or diagnosis of SARS-CoV-2 by FDA under an Emergency Use Authorization (EUA). This EUA will remain in effect (meaning this test can be used) for the duration of the COVID-19 declaration under Section 564(b)(1) of the Act, 21 U.S.C. section 360bbb-3(b)(1), unless the authorization is terminated or revoked.  Performed at [facility], [address] [place], [place] [address]     IMAGING RESULTS: Xray Left index finger - soft tissue swelling  I have personally reviewed the films ? Impression/Recommendation ? ?Pt presenting with left infdex finger soft tissue infection following a cut received by a box cutter Currently on vanco/cefepime Seen by ortho and they would like to try conservative management first ?I/D will help to relieve the pus and resolve the infection quickly- also can send cultures to  tailor antibiotics Will discuss with team He received Tetanus  vaccine today  HIV- well controlled- on Triumeq Last cd4 is >800 and undetectable VL  Discussed the management with patient  ___________________________________________________ Discussed with patient, requesting provider Note:  This document was prepared using Dragon voice recognition software and may include unintentional dictation errors.
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
## 2021-01-17 NOTE — Progress Notes (Signed)
Brief hospitalist update note.  This is a nonbillable note.  Please see same-day H&P from Dr. Sidney Ace for full billable details.  Briefly, this is a pleasant 60 year old African-American male with history significant for COPD, HIV on antiretroviral therapy who presents for evaluation of progressive right index finger swelling and pain.  Patient had an accidental laceration with a utility knife few weeks ago while opening a box.  Swelling and pain in affected finger has progressively worsened and the patient presented to the emergency room.  Concern for tenosynovitis.  Orthopedics consulted on admission.  Message sent to Dr. Sabra Heck.  His evaluation is currently pending.  Plain film x-ray negative for osseous abnormality.  Significant for soft tissue swelling.  Patient remains on IV fluids, IV antibiotics, pain control.  Will await orthopedic recommendations for further management.  Ralene Muskrat MD  No charge

## 2021-01-17 NOTE — Progress Notes (Signed)
Warm saline pitcher given to patient to soak finger in. Pt states understanding.

## 2021-01-17 NOTE — Progress Notes (Signed)
Pharmacy Antibiotic Note  Arthur Lynch is a 60 y.o. male admitted on 01/17/2021 with cellulitis.  Pharmacy has been consulted for Cefepime and Vancomycin dosing for 7 days.  Plan: Cefepime 2 gm q8h per indication and renal fxn.  Vancomycin Pt given Vanc 1 gm in ED. Vancomycin 1750 mg IV Q 24 hrs.  Goal AUC 400-550. Expected AUC: 494.8 SCr used: 1, TBW 69.8 kg < IBW 79.9 kg  Pharmacy will continue to follow and will adjust abx dosing when warranted.   Height: 6\' 1"  (185.4 cm) Weight: 69.8 kg (153 lb 14.1 oz) IBW/kg (Calculated) : 79.9  Temp (24hrs), Avg:98.1 F (36.7 C), Min:97.8 F (36.6 C), Max:98.4 F (36.9 C)  Recent Labs  Lab 01/17/21 0201  WBC 6.8  CREATININE 1.00    Estimated Creatinine Clearance: 79.5 mL/min (by C-G formula based on SCr of 1 mg/dL).    Allergies  Allergen Reactions   Penicillins Itching    Has patient had a PCN reaction causing immediate rash, facial/tongue/throat swelling, SOB or lightheadedness with hypotension: No Has patient had a PCN reaction causing severe rash involving mucus membranes or skin necrosis: No Has patient had a PCN reaction that required hospitalization No Has patient had a PCN reaction occurring within the last 10 years: No If all of the above answers are "NO", then may proceed with Cephalosporin use.     Antimicrobials this admission: 11/7 Cefepime >> x 7 days 11/7 Vancomycin >> x 7 days  Microbiology results: No lab cultures have been ordered or pending at this time.  Thank you for allowing pharmacy to be a part of this patient's care.  Renda Rolls, PharmD, [facility] 01/17/2021 3:17 AM

## 2021-01-17 NOTE — Consult Note (Signed)
ORTHOPAEDIC CONSULTATION  REQUESTING PHYSICIAN: Sidney Ace, MD  Chief Complaint: Right index finger pain and swelling  HPI: Arthur Lynch is a 60 y.o. male who complains of right index finger pain and swelling after laceration 2 weeks ago.  It healed initially but the last few days he developed redness and swelling and pain.  He came to the emergency room yesterday where exam showed pain and swelling and some redness.  White blood count was normal at 6800.  Temperature was normal.  Patient is HIV positive.  He is on medicine for this and under good control.  He is admitted for IV antibiotics and possible surgical evaluation.  Past Medical History:  Diagnosis Date   Abnormal Pap smear    Alcoholism in remission ([place]) 08/16/2015   Anemia    Atypical chest pain 10/06/2015   Cancer (HCC)    COPD (chronic obstructive pulmonary disease) (HCC)    Depression    Eczema 10/06/2015   HIV (human immunodeficiency virus infection) ([place])    Murmur, heart    Neuromuscular disorder (HCC)    Substance abuse ([place])    Past Surgical History:  Procedure Laterality Date   ANOSCOPY     Social History   Socioeconomic History   Marital status: Single    Spouse name: Not on file   Number of children: Not on file   Years of education: Not on file   Highest education level: Not on file  Occupational History   Not on file  Tobacco Use   Smoking status: Never   Smokeless tobacco: Never   Tobacco comments:    Given condoms  Substance and Sexual Activity   Alcohol use: Not Currently    Alcohol/week: 45.0 standard drinks    Types: 45 Standard drinks or equivalent per week    Comment: in residential program   Drug use: No   Sexual activity: Never    Partners: Male    Birth control/protection: Condom    Comment: declined condoms  Other Topics Concern   Not on file  Social History Narrative   Not on file   Social Determinants of Health   Financial Resource Strain: Not on file  Food  Insecurity: Not on file  Transportation Needs: Not on file  Physical Activity: Not on file  Stress: Not on file  Social Connections: Not on file   Family History  Problem Relation Age of Onset   Breast cancer Neg Hx    Allergies  Allergen Reactions   Penicillins Itching    Has patient had a PCN reaction causing immediate rash, facial/tongue/throat swelling, SOB or lightheadedness with hypotension: No Has patient had a PCN reaction causing severe rash involving mucus membranes or skin necrosis: No Has patient had a PCN reaction that required hospitalization No Has patient had a PCN reaction occurring within the last 10 years: No If all of the above answers are "NO", then may proceed with Cephalosporin use.    Prior to Admission medications   Medication Sig Start Date End Date Taking? Authorizing Provider  abacavir-dolutegravir-lamiVUDine (TRIUMEQ) 600-50-300 MG tablet Take 1 tablet by mouth daily. 10/27/20  Yes Golden Circle, FNP  amitriptyline (ELAVIL) 50 MG tablet Take 1 tablet (50 mg total) by mouth at bedtime. 05/06/20  Yes Golden Circle, FNP  triamcinolone cream (KENALOG) 0.1 % Apply 1 application topically 2 (two) times daily. Patient not taking: Reported on 01/17/2021 10/27/20   Golden Circle, FNP   DG Finger Index Right  Result Date: 01/17/2021 CLINICAL DATA:  Finger injury EXAM: RIGHT INDEX FINGER 2+V COMPARISON:  None. FINDINGS: No acute fracture or dislocation identified. Soft tissue swelling of the mid to distal finger with no radiopaque foreign body visualized. IMPRESSION: Soft tissue swelling.  No acute osseous abnormality identified. Electronically Signed   By: Ofilia Neas M.D.   On: 01/17/2021 09:23    Positive ROS: All other systems have been reviewed and were otherwise negative with the exception of those mentioned in the HPI and as above.  Physical Exam: General: Alert, no acute distress Cardiovascular: No pedal edema Respiratory: No cyanosis, no  use of accessory musculature GI: No organomegaly, abdomen is soft and non-tender Skin: No lesions in the area of chief complaint Neurologic: Sensation intact distally Psychiatric: Patient is competent for consent with normal mood and affect Lymphatic: No axillary or cervical lymphadenopathy  MUSCULOSKELETAL: The right index finger is slightly swollen and slightly reddened at the distal volar aspect of the distal phalanx and slightly proximally.  Does not extend to the PIP joint.  There is no tenderness or swelling proximally.  Range of motion is slightly restricted.  He is tender to touch.  No drainage.  His wound is well-healed.  No cellulitis proximally.  Assessment: Infection right index finger  Plan: This does not appear to be severe enough to require immediate surgical intervention.  I would recommend continued IV antibiotics and warm saline soaks.  We will reevaluate tomorrow.    Park Breed, MD [phone]   01/17/2021 2:01 PM

## 2021-01-18 DIAGNOSIS — L03019 Cellulitis of unspecified finger: Secondary | ICD-10-CM

## 2021-01-18 LAB — CREATININE, SERUM
Creatinine, Ser: 1.29 mg/dL — ABNORMAL HIGH (ref 0.61–1.24)
GFR, Estimated: >60 mL/min (ref >60)

## 2021-01-18 MED ORDER — IBUPROFEN 400 MG PO TABS
400.0000 mg | ORAL_TABLET | Freq: Four times a day (QID) | ORAL | Status: DC | PRN
  Administered 2021-01-18 – 2021-01-19 (×3): 400 mg via ORAL
  Filled 2021-01-18 (×3): qty 1

## 2021-01-18 MED ORDER — INFLUENZA VAC SPLIT QUAD 0.5 ML IM SUSY
0.5000 mL | PREFILLED_SYRINGE | INTRAMUSCULAR | Status: AC
  Administered 2021-01-19: 0.5 mL via INTRAMUSCULAR
  Filled 2021-01-18: qty 0.5

## 2021-01-18 NOTE — Progress Notes (Addendum)
ID Pt is doing better today with warm saline soaks Rt finger less swollen, less painful and mobility better No fever   O/e  Looks well Patient Vitals for the past 24 hrs:  BP Temp Temp src Pulse Resp SpO2  01/18/21 1532 115/71 98.4 F (36.9 C) Oral 86 18 100 %  01/18/21 1158 118/81 98.2 F (36.8 C) -- 77 17 99 %  01/18/21 0753 106/70 97.7 F (36.5 C) -- 76 17 99 %  01/18/21 0431 100/76 98.2 F (36.8 C) -- 85 18 97 %  01/17/21 2329 110/71 98.3 F (36.8 C) -- 88 18 98 %  01/17/21 2036 116/77 98.2 F (36.8 C) -- (!) 107 17 98 %   Chest CTA Hss1s2 Abd Sfot Rt index finger softer, less tender      Labs CBC Latest Ref Rng & Units 01/17/2021 01/17/2021 06/16/2020  WBC 4.0 - 10.5 K/uL 8.2 6.8 5.2  Hemoglobin 13.0 - 17.0 g/dL 11.4(L) 11.8(L) 12.2(L)  Hematocrit 39.0 - 52.0 % 35.2(L) 35.1(L) 35.6(L)  Platelets 150 - 400 K/uL 339 330 333    CMP Latest Ref Rng & Units 01/18/2021 01/17/2021 01/17/2021  Glucose 70 - 99 mg/dL - 107(H) 139(H)  BUN 6 - 20 mg/dL - 10 11  Creatinine 0.61 - 1.24 mg/dL 1.29(H) 1.12 1.00  Sodium 135 - 145 mmol/L - 138 136  Potassium 3.5 - 5.1 mmol/L - 3.8 3.9  Chloride 98 - 111 mmol/L - 103 103  CO2 22 - 32 mmol/L - 27 25  Calcium 8.9 - 10.3 mg/dL - 9.1 9.1  Total Protein 6.5 - 8.1 g/dL - 8.2(H) -  Total Bilirubin 0.3 - 1.2 mg/dL - 0.8 -  Alkaline Phos 38 - 126 U/L - 87 -  AST 15 - 41 U/L - 22 -  ALT 0 - 44 U/L - 13 -     Impression/recommendation ?Pt presenting with left infdex finger soft tissue infection following a cut received by a box cutter Currently on vanco/cefepime Seen by ortho and they would like to try conservative management first ?warm saline soaks workign  He received Tdap in 2020 ( and not yesterday as mentioned in my note) Will Dc vancomycin, change cefepime to unasyn tomorrow Gave a culture swab to him to take a culture if the abscess burst  AKI- DC vanco   PCN allergy but has taken amoxicillin and tolerated well before    HIV- well controlled- on Triumeq Last cd4 is >800 and undetectable VL   Discussed the management with patient   RCID covering remotely tomorrow

## 2021-01-18 NOTE — Progress Notes (Signed)
Subjective:    Patient is feeling much better today.  He has been doing warm saline soaks.  It is no longer sensitive to touch and he is moving it better.  Exam shows minimal redness and swelling on the volar surface.  He does have some flexion fluctuance dorsally consistent with a paronychia.  Not red or sore.  Is not draining yet.  Patient reports pain as mild.  Objective:   VITALS:   Vitals:   01/18/21 1158 01/18/21 1532  BP: 118/81 115/71  Pulse: 77 86  Resp: 17 18  Temp: 98.2 F (36.8 C) 98.4 F (36.9 C)  SpO2: 99% 100%    Neurologically intact No cellulitis present  LABS Recent Labs    01/17/21 0201 01/17/21 0458  HGB 11.8* 11.4*  HCT 35.1* 35.2*  WBC 6.8 8.2  PLT 330 339    Recent Labs    01/17/21 0201 01/17/21 0458 01/18/21 0630  NA 136 138  --   K 3.9 3.8  --   BUN 11 10  --   CREATININE 1.00 1.12 1.29*  GLUCOSE 139* 107*  --     Recent Labs    01/17/21 0201  INR 1.0     Assessment/Plan:    Continue IV antibiotics overnight.    I will bring material to block the finger and do an I&D at the bedside tomorrow at noon.    He can be switched to p.o. antibiotics tomorrow I believe.

## 2021-01-18 NOTE — Plan of Care (Signed)

## 2021-01-18 NOTE — Progress Notes (Signed)
PROGRESS NOTE    Baby Stairs  NTI:[id number] DOB: 1960/07/25 DOA: 01/17/2021 PCP: No primary care provider on file.    Brief Narrative:  60 y.o. African-American male with medical history significant for COPD, HIV on antiretroviral therapy, depression and substance abuse, who presented to the emergency room with acute onset of right index swelling and pain which has been throbbing this evening.  He had normal CD4 count and undetectable viral HIV load in April and August 2022.  No fever or chills.  He stated that about a couple weeks ago he had an accidental injury and cut his finger with a utility knife at work while opening a box.  He then had a laceration to his distal right index finger.  No chest pain or dyspnea or cough or wheezing.  No nausea or vomiting or abdominal pain.  No bleeding diathesis.  He has been compliant with his antiretroviral therapy.  Seen in consultation by orthopedic surgery.  Current plan of care is to monitor exam on IV antibiotics and reevaluate indication for surgical exploration.  Plain film x-ray of finger negative for osteomyelitis.  Patient is stable, not septic or toxic appearing.   Assessment & Plan:   Active Problems:   Stenosing tenosynovitis of finger  Right index finger cellulitis/infection Secondary to traumatic injury Orthopedics consulted Received tetanus vaccine 11/7 Plan: Per orthopedic recommendations continue IV antibiotics and warm saline soaks Ensure pain control Monitor vitals and fever curve Pending orthopedic follow-up  HIV Well-controlled on Triumeq Last CD4 greater than 800, undetectable viral load ID consulted      DVT prophylaxis: SQ Lovenox Code Status: Full Family Communication: None today Disposition Plan: Status is: Inpatient  Remains inpatient appropriate because: Right finger infection in setting of well-controlled HIV.  Pending orthopedic follow-up.  Possible discharge in 1 to 2 days.       Level of  care: Med-Surg  Consultants:  Orthopedic surgery ID  Procedures:  None  Antimicrobials: Vancomycin Cefepime   Subjective: Seen and examined.  No acute distress.  Reports improved range of motion and swelling of right index finger  Objective: Vitals:   01/17/21 2036 01/17/21 2329 01/18/21 0431 01/18/21 0753  BP: 116/77 110/71 100/76 106/70  Pulse: (!) 107 88 85 76  Resp: 17 18 18 17   Temp: 98.2 F (36.8 C) 98.3 F (36.8 C) 98.2 F (36.8 C) 97.7 F (36.5 C)  TempSrc:      SpO2: 98% 98% 97% 99%  Weight:      Height:        Intake/Output Summary (Last 24 hours) at 01/18/2021 1019 Last data filed at 01/18/2021 8676 Gross per 24 hour  Intake 1477.62 ml  Output 1400 ml  Net 77.62 ml   Filed Weights   01/16/21 1712  Weight: 69.8 kg    Examination:  General exam: Appears calm and comfortable  Respiratory system: Clear to auscultation. Respiratory effort normal. Cardiovascular system: S1 & S2 heard, RRR. No JVD, murmurs, rubs, gallops or clicks. No pedal edema. Gastrointestinal system: Abdomen is nondistended, soft and nontender. No organomegaly or masses felt. Normal bowel sounds heard. Central nervous system: Alert and oriented. No focal neurological deficits. Extremities: Right index finger swollen, tender to touch, decreased range of motion Skin: No rashes, lesions or ulcers Psychiatry: Judgement and insight appear normal. Mood & affect appropriate.     Data Reviewed: I have personally reviewed following labs and imaging studies  CBC: Recent Labs  Lab 01/17/21 0201 01/17/21 0458  WBC 6.8  8.2  NEUTROABS 4.8  --   HGB 11.8* 11.4*  HCT 35.1* 35.2*  MCV 99.2 100.6*  PLT 330 097   Basic Metabolic Panel: Recent Labs  Lab 01/17/21 0201 01/17/21 0458 01/18/21 0630  NA 136 138  --   K 3.9 3.8  --   CL 103 103  --   CO2 25 27  --   GLUCOSE 139* 107*  --   BUN 11 10  --   CREATININE 1.00 1.12 1.29*  CALCIUM 9.1 9.1  --    GFR: Estimated Creatinine  Clearance: 61.6 mL/min (A) (by C-G formula based on SCr of 1.29 mg/dL (H)). Liver Function Tests: Recent Labs  Lab 01/17/21 0458  AST 22  ALT 13  ALKPHOS 87  BILITOT 0.8  PROT 8.2*  ALBUMIN 3.7   No results for input(s): LIPASE, AMYLASE in the last 168 hours. No results for input(s): AMMONIA in the last 168 hours. Coagulation Profile: Recent Labs  Lab 01/17/21 0201  INR 1.0   Cardiac Enzymes: No results for input(s): CKTOTAL, CKMB, CKMBINDEX, TROPONINI in the last 168 hours. BNP (last 3 results) No results for input(s): PROBNP in the last 8760 hours. HbA1C: No results for input(s): HGBA1C in the last 72 hours. CBG: No results for input(s): GLUCAP in the last 168 hours. Lipid Profile: No results for input(s): CHOL, HDL, LDLCALC, TRIG, CHOLHDL, LDLDIRECT in the last 72 hours. Thyroid Function Tests: No results for input(s): TSH, T4TOTAL, FREET4, T3FREE, THYROIDAB in the last 72 hours. Anemia Panel: No results for input(s): VITAMINB12, FOLATE, FERRITIN, TIBC, IRON, RETICCTPCT in the last 72 hours. Sepsis Labs: No results for input(s): PROCALCITON, LATICACIDVEN in the last 168 hours.  Recent Results (from the past 240 hour(s))  Resp Panel by RT-PCR (Flu A&B, Covid) Nasopharyngeal Swab     Status: None   Collection Time: 01/17/21  2:21 AM   Specimen: Nasopharyngeal Swab; Nasopharyngeal(NP) swabs in vial transport medium  Result Value Ref Range Status   SARS Coronavirus 2 by RT PCR NEGATIVE NEGATIVE Final    Comment: (NOTE) SARS-CoV-2 target nucleic acids are NOT DETECTED.  The SARS-CoV-2 RNA is generally detectable in upper respiratory specimens during the acute phase of infection. The lowest concentration of SARS-CoV-2 viral copies this assay can detect is 138 copies/mL. A negative result does not preclude SARS-Cov-2 infection and should not be used as the sole basis for treatment or other patient management decisions. A negative result may occur with  improper  specimen collection/handling, submission of specimen other than nasopharyngeal swab, presence of viral mutation(s) within the areas targeted by this assay, and inadequate number of viral copies(<138 copies/mL). A negative result must be combined with clinical observations, patient history, and epidemiological information. The expected result is Negative.  Fact Sheet for Patients:  EntrepreneurPulse.com.au  Fact Sheet for Healthcare Providers:  IncredibleEmployment.be  This test is no t yet approved or cleared by the [place] FDA and  has been authorized for detection and/or diagnosis of SARS-CoV-2 by FDA under an Emergency Use Authorization (EUA). This EUA will remain  in effect (meaning this test can be used) for the duration of the COVID-19 declaration under Section 564(b)(1) of the Act, 21 U.S.C.section 360bbb-3(b)(1), unless the authorization is terminated  or revoked sooner.       Influenza A by PCR NEGATIVE NEGATIVE Final   Influenza B by PCR NEGATIVE NEGATIVE Final    Comment: (NOTE) The Xpert Xpress SARS-CoV-2/FLU/RSV plus assay is intended as an aid in the diagnosis  of influenza from Nasopharyngeal swab specimens and should not be used as a sole basis for treatment. Nasal washings and aspirates are unacceptable for Xpert Xpress SARS-CoV-2/FLU/RSV testing.  Fact Sheet for Patients: EntrepreneurPulse.com.au  Fact Sheet for Healthcare Providers: IncredibleEmployment.be  This test is not yet approved or cleared by the [place] FDA and has been authorized for detection and/or diagnosis of SARS-CoV-2 by FDA under an Emergency Use Authorization (EUA). This EUA will remain in effect (meaning this test can be used) for the duration of the COVID-19 declaration under Section 564(b)(1) of the Act, 21 U.S.C. section 360bbb-3(b)(1), unless the authorization is terminated or revoked.  Performed at  [facility], [address], [place], [place] [address]          Radiology Studies: DG Finger Index Right  Result Date: 01/17/2021 CLINICAL DATA:  Finger injury EXAM: RIGHT INDEX FINGER 2+V COMPARISON:  None. FINDINGS: No acute fracture or dislocation identified. Soft tissue swelling of the mid to distal finger with no radiopaque foreign body visualized. IMPRESSION: Soft tissue swelling.  No acute osseous abnormality identified. Electronically Signed   By: Ofilia Neas M.D.   On: 01/17/2021 09:23        Scheduled Meds:  abacavir-dolutegravir-lamiVUDine  1 tablet Oral Daily   amitriptyline  50 mg Oral QHS   enoxaparin (LOVENOX) injection  40 mg Subcutaneous Q24H   [START ON 01/19/2021] influenza vac split quadrivalent PF  0.5 mL Intramuscular Tomorrow-1000   Continuous Infusions:  ceFEPime (MAXIPIME) IV Stopped (01/18/21 2876)   vancomycin 1,750 mg (01/17/21 1657)     LOS: 1 day    Time spent: 25 minutes    Sidney Ace, MD [facility]   If 7PM-7AM, please contact night-coverage  01/18/2021, 10:19 AM

## 2021-01-19 ENCOUNTER — Encounter: Payer: Self-pay | Admitting: Family Medicine

## 2021-01-19 DIAGNOSIS — L089 Local infection of the skin and subcutaneous tissue, unspecified: Secondary | ICD-10-CM | POA: Diagnosis present

## 2021-01-19 LAB — CREATININE, SERUM
Creatinine, Ser: 1.13 mg/dL (ref 0.61–1.24)
GFR, Estimated: >60 mL/min (ref >60)

## 2021-01-19 MED ORDER — AMOXICILLIN-POT CLAVULANATE 875-125 MG PO TABS: 1.0000 | ORAL_TABLET | Freq: Two times a day (BID) | ORAL | 0 refills | Status: AC

## 2021-01-19 NOTE — Discharge Summary (Addendum)
Physician Discharge Summary  Arthur Lynch OJJ:[id number] DOB: [birth year] DOA: 01/17/2021  PCP: No primary care provider on file.  Admit date: 01/17/2021 Discharge date: 01/19/2021  Discharge disposition: Home   Recommendations for Outpatient Follow-Up:   Follow-up with Dr. Sabra Heck, orthopedic surgeon, in 1 week Follow-up with Dr. Steva Ready, infectious disease specialist, in 1 week   Discharge Diagnosis:   Active Problems:   Stenosing tenosynovitis of finger   Infection of index finger, right    Discharge Condition: Stable.  Diet recommendation:  Diet Order             Diet - low sodium heart healthy           Diet Heart Room service appropriate? Yes; Fluid consistency: Thin  Diet effective now                     Code Status: Full Code     Hospital Course:   Arthur Lynch is a 60 year old man with medical history significant for HIV infection on Triumeq, COPD, depression, substance use disorder, who presented to the hospital with painful swelling of right index finger.  He was admitted to the hospital for right index finger soft tissue infection.  He was treated with empiric IV antibiotics.  He was seen in consultation by orthopedic surgeon, Dr. Sabra Heck.  I&D was performed and from Dr. Ammie Ferrier standpoint, patient was okay for discharge on oral antibiotics.  Patient said he had penicillin allergy (itching) when he was a child.  However, he has been able to tolerate penicillin since his penicillin allergy in childhood.  He said he had amoxicillin about 6 to 8 months ago without any allergic reaction or adverse events.  He has been prescribed a 10-day course of Augmentin.  Case was discussed with Dr. Prudencio Pair, ID specialist (covering for Dr. Steva Ready), over the phone.  Medical Consultants:   Infectious disease Orthopedic surgeon   Discharge Exam:    Vitals:   01/19/21 0434 01/19/21 0729 01/19/21 1153 01/19/21 1536  BP: 98/67 110/78 106/82 107/76   Pulse: 75 80 88 79  Resp: 17 16 16 14   Temp: 97.7 F (36.5 C) (!) 97.5 F (36.4 C) (!) 97.4 F (36.3 C) 97.7 F (36.5 C)  TempSrc:  Oral Oral   SpO2: 99%  99% 95%  Weight:      Height:         GEN: NAD SKIN: Warm and dry EYES: No pallor or icterus ENT: MMM CV: RRR PULM: CTA B ABD: soft, ND, NT, +BS CNS: AAO x 3, non focal EXT: Right index finger swelling.  No erythema or tenderness.   The results of significant diagnostics from this hospitalization (including imaging, microbiology, ancillary and laboratory) are listed below for reference.     Procedures and Diagnostic Studies:   DG Finger Index Right  Result Date: 01/17/2021 CLINICAL DATA:  Finger injury EXAM: RIGHT INDEX FINGER 2+V COMPARISON:  None. FINDINGS: No acute fracture or dislocation identified. Soft tissue swelling of the mid to distal finger with no radiopaque foreign body visualized. IMPRESSION: Soft tissue swelling.  No acute osseous abnormality identified. Electronically Signed   By: Ofilia Neas M.D.   On: 01/17/2021 09:23     Labs:   Basic Metabolic Panel: Recent Labs  Lab 01/17/21 0201 01/17/21 0458 01/18/21 0630 01/19/21 0310  NA 136 138  --   --   K 3.9 3.8  --   --   CL 103 103  --   --  CO2 25 27  --   --   GLUCOSE 139* 107*  --   --   BUN 11 10  --   --   CREATININE 1.00 1.12 1.29* 1.13  CALCIUM 9.1 9.1  --   --    GFR Estimated Creatinine Clearance: 70.3 mL/min (by C-G formula based on SCr of 1.13 mg/dL). Liver Function Tests: Recent Labs  Lab 01/17/21 0458  AST 22  ALT 13  ALKPHOS 87  BILITOT 0.8  PROT 8.2*  ALBUMIN 3.7   No results for input(s): LIPASE, AMYLASE in the last 168 hours. No results for input(s): AMMONIA in the last 168 hours. Coagulation profile Recent Labs  Lab 01/17/21 0201  INR 1.0    CBC: Recent Labs  Lab 01/17/21 0201 01/17/21 0458  WBC 6.8 8.2  NEUTROABS 4.8  --   HGB 11.8* 11.4*  HCT 35.1* 35.2*  MCV 99.2 100.6*  PLT 330 339    Cardiac Enzymes: No results for input(s): CKTOTAL, CKMB, CKMBINDEX, TROPONINI in the last 168 hours. BNP: Invalid input(s): POCBNP CBG: No results for input(s): GLUCAP in the last 168 hours. D-Dimer No results for input(s): DDIMER in the last 72 hours. Hgb A1c No results for input(s): HGBA1C in the last 72 hours. Lipid Profile No results for input(s): CHOL, HDL, LDLCALC, TRIG, CHOLHDL, LDLDIRECT in the last 72 hours. Thyroid function studies No results for input(s): TSH, T4TOTAL, T3FREE, THYROIDAB in the last 72 hours.  Invalid input(s): FREET3 Anemia work up No results for input(s): VITAMINB12, FOLATE, FERRITIN, TIBC, IRON, RETICCTPCT in the last 72 hours. Microbiology Recent Results (from the past 240 hour(s))  Resp Panel by RT-PCR (Flu A&B, Covid) Nasopharyngeal Swab     Status: None   Collection Time: 01/17/21  2:21 AM   Specimen: Nasopharyngeal Swab; Nasopharyngeal(NP) swabs in vial transport medium  Result Value Ref Range Status   SARS Coronavirus 2 by RT PCR NEGATIVE NEGATIVE Final    Comment: (NOTE) SARS-CoV-2 target nucleic acids are NOT DETECTED.  The SARS-CoV-2 RNA is generally detectable in upper respiratory specimens during the acute phase of infection. The lowest concentration of SARS-CoV-2 viral copies this assay can detect is 138 copies/mL. A negative result does not preclude SARS-Cov-2 infection and should not be used as the sole basis for treatment or other patient management decisions. A negative result may occur with  improper specimen collection/handling, submission of specimen other than nasopharyngeal swab, presence of viral mutation(s) within the areas targeted by this assay, and inadequate number of viral copies(<138 copies/mL). A negative result must be combined with clinical observations, patient history, and epidemiological information. The expected result is Negative.  Fact Sheet for Patients:   EntrepreneurPulse.com.au  Fact Sheet for Healthcare Providers:  IncredibleEmployment.be  This test is no t yet approved or cleared by the [place] FDA and  has been authorized for detection and/or diagnosis of SARS-CoV-2 by FDA under an Emergency Use Authorization (EUA). This EUA will remain  in effect (meaning this test can be used) for the duration of the COVID-19 declaration under Section 564(b)(1) of the Act, 21 U.S.C.section 360bbb-3(b)(1), unless the authorization is terminated  or revoked sooner.       Influenza A by PCR NEGATIVE NEGATIVE Final   Influenza B by PCR NEGATIVE NEGATIVE Final    Comment: (NOTE) The Xpert Xpress SARS-CoV-2/FLU/RSV plus assay is intended as an aid in the diagnosis of influenza from Nasopharyngeal swab specimens and should not be used as a sole basis for treatment. Nasal  washings and aspirates are unacceptable for Xpert Xpress SARS-CoV-2/FLU/RSV testing.  Fact Sheet for Patients: EntrepreneurPulse.com.au  Fact Sheet for Healthcare Providers: IncredibleEmployment.be  This test is not yet approved or cleared by the [place] FDA and has been authorized for detection and/or diagnosis of SARS-CoV-2 by FDA under an Emergency Use Authorization (EUA). This EUA will remain in effect (meaning this test can be used) for the duration of the COVID-19 declaration under Section 564(b)(1) of the Act, 21 U.S.C. section 360bbb-3(b)(1), unless the authorization is terminated or revoked.  Performed at [facility], [address] [place], [place] [address]      Discharge Instructions:   Discharge Instructions     Diet - low sodium heart healthy   Complete by: As directed    Increase activity slowly   Complete by: As directed       Allergies as of 01/19/2021       Reactions   Penicillins Itching   Has patient had a PCN reaction causing immediate rash,  facial/tongue/throat swelling, SOB or lightheadedness with hypotension: No Has patient had a PCN reaction causing severe rash involving mucus membranes or skin necrosis: No Has patient had a PCN reaction that required hospitalization No Has patient had a PCN reaction occurring within the last 10 years: No If all of the above answers are "NO", then may proceed with Cephalosporin use.        Medication List     STOP taking these medications    triamcinolone cream 0.1 % Commonly known as: KENALOG       TAKE these medications    amitriptyline 50 MG tablet Commonly known as: ELAVIL Take 1 tablet (50 mg total) by mouth at bedtime.   amoxicillin-clavulanate 875-125 MG tablet Commonly known as: Augmentin Take 1 tablet by mouth 2 (two) times daily for 10 days.   Triumeq 600-50-300 MG tablet Generic drug: abacavir-dolutegravir-lamiVUDine Take 1 tablet by mouth daily.        Follow-up Information     Earnestine Leys, MD Follow up in 1 week(s).   Specialty: Orthopedic Surgery Why: Please make an appointment for the patient 5 to 7 days after discharge. Contact information: [place] [address] [phone]         Tsosie Billing, MD. Schedule an appointment as soon as possible for a visit in 1 week(s).   Specialty: Infectious Diseases Contact information: [place] [address] [phone]                   If you experience worsening of your admission symptoms, develop shortness of breath, life threatening emergency, suicidal or homicidal thoughts you must seek medical attention immediately by calling 911 or calling your MD immediately  if symptoms less severe.   You must read complete instructions/literature along with all the possible adverse reactions/side effects for all the medicines you take and that have been prescribed to you. Take any new medicines after you have completely understood and accept all  the possible adverse reactions/side effects.    Please note   You were cared for by a hospitalist during your hospital stay. If you have any questions about your discharge medications or the care you received while you were in the hospital after you are discharged, you can call the unit and asked to speak with the hospitalist on call if the hospitalist that took care of you is not available. Once you are discharged, your primary care physician will handle  any further medical issues. Please note that NO REFILLS for any discharge medications will be authorized once you are discharged, as it is imperative that you return to your primary care physician (or establish a relationship with a primary care physician if you do not have one) for your aftercare needs so that they can reassess your need for medications and monitor your lab values.       Time coordinating discharge: 32 minutes  Signed:  Senica Crall  [facility] 01/19/2021, 5:20 PM   Pager on [email or web address]. If 7PM-7AM, please contact night-coverage at [email or web address]

## 2021-01-19 NOTE — Progress Notes (Signed)
Rounded on patient. Offered 4 Ps. Patient stated " I am ok just listening to music".

## 2021-01-19 NOTE — Progress Notes (Signed)
Rounded and offered 4 Ps. Patient said he was fine for now.

## 2021-01-19 NOTE — Progress Notes (Signed)
Subjective:     Patient is feeling better today.  The paronychia over the dorsum of the right index finger has not drained.  There is less tenderness.  The finger was blocked with 1% Xylocaine and half percent Marcaine and after Betadine prep the fluctuant area was lanced and pus removed.  This was cultured.  Dry sterile dressing was applied.  He may be discharged on p.o. antibiotics is seen fit by the medical service.  Should be seen in my office 4 or 5 days later.    Patient reports pain as mild.  Objective:   VITALS:   Vitals:   01/19/21 0729 01/19/21 1153  BP: 110/78 106/82  Pulse: 80 88  Resp: 16 16  Temp: (!) 97.5 F (36.4 C) (!) 97.4 F (36.3 C)  SpO2:  99%    Sensation intact distally  LABS Recent Labs    01/17/21 0201 01/17/21 0458  HGB 11.8* 11.4*  HCT 35.1* 35.2*  WBC 6.8 8.2  PLT 330 339    Recent Labs    01/17/21 0201 01/17/21 0458 01/18/21 0630 01/19/21 0310  NA 136 138  --   --   K 3.9 3.8  --   --   BUN 11 10  --   --   CREATININE 1.00 1.12 1.29* 1.13  GLUCOSE 139* 107*  --   --     Recent Labs    01/17/21 0201  INR 1.0     Assessment/Plan:    I&D performed.  Cultures taken. Switch to p.o. antibiotics and discharge when considered safe. Follow-up in my office in 4 to 5 days after discharge.

## 2021-01-19 NOTE — Progress Notes (Signed)
Rounded and offered 4 Ps.

## 2021-01-19 NOTE — Progress Notes (Signed)
Patient D/C to medical mall parking lot. Patient has all belongings and IV out.

## 2021-01-20 ENCOUNTER — Telehealth: Payer: Self-pay

## 2021-01-20 NOTE — Telephone Encounter (Signed)
Mitch with [facility] called to schedule hospital follow up. His HIV care is managed by Terri Piedra, NP. He was recently seen in the hospital by Dr. Delaine Lame for index finger soft tissue infection and needs follow up. Karn Pickler is able to bring the patient on Monday afternoon.    Beryle Flock, RN

## 2021-01-22 LAB — AEROBIC CULTURE W GRAM STAIN (SUPERFICIAL SPECIMEN)

## 2021-01-24 ENCOUNTER — Ambulatory Visit (INDEPENDENT_AMBULATORY_CARE_PROVIDER_SITE_OTHER): Payer: Self-pay | Admitting: Internal Medicine

## 2021-01-24 ENCOUNTER — Other Ambulatory Visit: Payer: Self-pay | Admitting: Family

## 2021-01-24 ENCOUNTER — Other Ambulatory Visit: Payer: Self-pay

## 2021-01-24 ENCOUNTER — Encounter: Payer: Self-pay | Admitting: Internal Medicine

## 2021-01-24 VITALS — BP 113/78 | HR 90 | Temp 98.1°F | Wt 149.6 lb

## 2021-01-24 DIAGNOSIS — B20 Human immunodeficiency virus [HIV] disease: Secondary | ICD-10-CM

## 2021-01-24 DIAGNOSIS — L089 Local infection of the skin and subcutaneous tissue, unspecified: Secondary | ICD-10-CM

## 2021-01-24 NOTE — Patient Instructions (Signed)
Thank you for coming to see me today. It was a pleasure seeing you.  To Do: Finish off the course of Augmentin Your cultures from the hospital grew Staph aureus, but not the resistant kind known as MRSA FOllow up with Marya Amsler as scheduled  If you have any questions or concerns, please do not hesitate to call the office at [phone].  Take Care,   Jule Ser

## 2021-01-24 NOTE — Assessment & Plan Note (Signed)
Secondary to MSSA and doing well on Augmentin following I&D by Dr Sabra Heck on 01/19/21.  Will finish 10 day course of Augmentin which should adequately treat this infection.  Return precautions discussed.  Follow up with orthopedic surgery already planned.

## 2021-01-24 NOTE — Assessment & Plan Note (Signed)
Well controlled on Triumeq.  Follow up with Terri Piedra as scheduled.

## 2021-01-24 NOTE — Telephone Encounter (Signed)
Okay to refill? 

## 2021-01-24 NOTE — Progress Notes (Signed)
[facility] for Infectious Disease  CHIEF COMPLAINT:    Follow up for hospital follow up for finger infection  SUBJECTIVE:    Arthur Lynch is a 60 y.o. male with PMHx as below who presents to the clinic for hospital follow up regarding a right index finger infection.   He was admitted at [facility] 11/7-11/9/22 for right index finger infection treated with empiric IV antibiotics.  He underwent I&D with Dr Sabra Heck and was discharged on Augmentin x 10 days after hospitalist discussed with Dr Gale Journey over the phone.  He was initially seen by Dr Delaine Lame during this admission as well.  He is known to our clinic here because of his well controlled HIV (on Triumeq) that he follows with Terri Piedra for.  Cultures from his I&D have grown MSSA.  He has about 5-6 days left on his Augmentin and reports finger to be healing well an no new concerns.  He sees Dr Sabra Heck for follow up soon.    Please see A&P for the details of today's visit and status of the patient's medical problems.   Patient's Medications  New Prescriptions   No medications on file  Previous Medications   ABACAVIR-DOLUTEGRAVIR-LAMIVUDINE (TRIUMEQ) 600-50-300 MG TABLET    Take 1 tablet by mouth daily.   AMITRIPTYLINE (ELAVIL) 50 MG TABLET    Take 1 tablet (50 mg total) by mouth at bedtime.   AMOXICILLIN-CLAVULANATE (AUGMENTIN) 875-125 MG TABLET    Take 1 tablet by mouth 2 (two) times daily for 10 days.  Modified Medications   No medications on file  Discontinued Medications   No medications on file      Past Medical History:  Diagnosis Date   Abnormal Pap smear    Alcoholism in remission ([place]) 08/16/2015   Anemia    Atypical chest pain 10/06/2015   Cancer (HCC)    COPD (chronic obstructive pulmonary disease) (HCC)    Depression    Eczema 10/06/2015   HIV (human immunodeficiency virus infection) ([place])    Murmur, heart    Neuromuscular disorder (HCC)    Substance abuse ([place])     Social History   Tobacco Use    Smoking status: Never   Smokeless tobacco: Never  Substance Use Topics   Alcohol use: Not Currently    Alcohol/week: 45.0 standard drinks    Types: 45 Standard drinks or equivalent per week    Comment: in residential program   Drug use: No    Family History  Problem Relation Age of Onset   Breast cancer Neg Hx     Allergies  Allergen Reactions   Penicillins Itching    Has patient had a PCN reaction causing immediate rash, facial/tongue/throat swelling, SOB or lightheadedness with hypotension: No Has patient had a PCN reaction causing severe rash involving mucus membranes or skin necrosis: No Has patient had a PCN reaction that required hospitalization No Has patient had a PCN reaction occurring within the last 10 years: No If all of the above answers are "NO", then may proceed with Cephalosporin use.     Review of Systems  All other systems reviewed and are negative. Except as noted in HPI.    OBJECTIVE:    Vitals:   01/24/21 1527  BP: 113/78  Pulse: 90  Temp: 98.1 F (36.7 C)  TempSrc: Oral  Weight: 149 lb 9.6 oz (67.9 kg)   Body mass index is 19.74 kg/m.  Physical Exam Constitutional:  General: He is not in acute distress.    Appearance: Normal appearance.  HENT:     Head: Normocephalic and atraumatic.  Pulmonary:     Effort: Pulmonary effort is normal. No respiratory distress.  Skin:    General: Skin is warm and dry.  Neurological:     General: No focal deficit present.     Mental Status: He is alert and oriented to person, place, and time.  Psychiatric:        Mood and Affect: Mood normal.        Behavior: Behavior normal.        Labs and Microbiology: CBC Latest Ref Rng & Units 01/17/2021 01/17/2021 06/16/2020  WBC 4.0 - 10.5 K/uL 8.2 6.8 5.2  Hemoglobin 13.0 - 17.0 g/dL 11.4(L) 11.8(L) 12.2(L)  Hematocrit 39.0 - 52.0 % 35.2(L) 35.1(L) 35.6(L)  Platelets 150 - 400 K/uL 339 330 333   CMP Latest Ref Rng & Units 01/19/2021 01/18/2021  01/17/2021  Glucose 70 - 99 mg/dL - - 107(H)  BUN 6 - 20 mg/dL - - 10  Creatinine 0.61 - 1.24 mg/dL 1.13 1.29(H) 1.12  Sodium 135 - 145 mmol/L - - 138  Potassium 3.5 - 5.1 mmol/L - - 3.8  Chloride 98 - 111 mmol/L - - 103  CO2 22 - 32 mmol/L - - 27  Calcium 8.9 - 10.3 mg/dL - - 9.1  Total Protein 6.5 - 8.1 g/dL - - 8.2(H)  Total Bilirubin 0.3 - 1.2 mg/dL - - 0.8  Alkaline Phos 38 - 126 U/L - - 87  AST 15 - 41 U/L - - 22  ALT 0 - 44 U/L - - 13     Recent Results (from the past 240 hour(s))  Resp Panel by RT-PCR (Flu A&B, Covid) Nasopharyngeal Swab     Status: None   Collection Time: 01/17/21  2:21 AM   Specimen: Nasopharyngeal Swab; Nasopharyngeal(NP) swabs in vial transport medium  Result Value Ref Range Status   SARS Coronavirus 2 by RT PCR NEGATIVE NEGATIVE Final    Comment: (NOTE) SARS-CoV-2 target nucleic acids are NOT DETECTED.  The SARS-CoV-2 RNA is generally detectable in upper respiratory specimens during the acute phase of infection. The lowest concentration of SARS-CoV-2 viral copies this assay can detect is 138 copies/mL. A negative result does not preclude SARS-Cov-2 infection and should not be used as the sole basis for treatment or other patient management decisions. A negative result may occur with  improper specimen collection/handling, submission of specimen other than nasopharyngeal swab, presence of viral mutation(s) within the areas targeted by this assay, and inadequate number of viral copies(<138 copies/mL). A negative result must be combined with clinical observations, patient history, and epidemiological information. The expected result is Negative.  Fact Sheet for Patients:  EntrepreneurPulse.com.au  Fact Sheet for Healthcare Providers:  IncredibleEmployment.be  This test is no t yet approved or cleared by the [place] FDA and  has been authorized for detection and/or diagnosis of SARS-CoV-2 by FDA under an  Emergency Use Authorization (EUA). This EUA will remain  in effect (meaning this test can be used) for the duration of the COVID-19 declaration under Section 564(b)(1) of the Act, 21 U.S.C.section 360bbb-3(b)(1), unless the authorization is terminated  or revoked sooner.       Influenza A by PCR NEGATIVE NEGATIVE Final   Influenza B by PCR NEGATIVE NEGATIVE Final    Comment: (NOTE) The Xpert Xpress SARS-CoV-2/FLU/RSV plus assay is intended as an aid in the diagnosis of  influenza from Nasopharyngeal swab specimens and should not be used as a sole basis for treatment. Nasal washings and aspirates are unacceptable for Xpert Xpress SARS-CoV-2/FLU/RSV testing.  Fact Sheet for Patients: EntrepreneurPulse.com.au  Fact Sheet for Healthcare Providers: IncredibleEmployment.be  This test is not yet approved or cleared by the [place] FDA and has been authorized for detection and/or diagnosis of SARS-CoV-2 by FDA under an Emergency Use Authorization (EUA). This EUA will remain in effect (meaning this test can be used) for the duration of the COVID-19 declaration under Section 564(b)(1) of the Act, 21 U.S.C. section 360bbb-3(b)(1), unless the authorization is terminated or revoked.  Performed at [facility], [place], [place] [address]   Aerobic Culture w Gram Stain (superficial specimen)     Status: None   Collection Time: 01/19/21  1:30 PM   Specimen: Joint, Finger  Result Value Ref Range Status   Specimen Description   Final    FINGER R.FINGER Performed at [facility], [address] [place], [place] [address]    Special Requests   Final    FINGER R. FINGER Performed at [facility], [address] [place], [place] [address]    Gram Stain   Final    FEW WBC PRESENT, PREDOMINANTLY PMN FEW GRAM POSITIVE COCCI IN PAIRS IN CLUSTERS Performed at [facility], [place] [address]  [place], [place] [address]    Culture FEW STAPHYLOCOCCUS AUREUS  Final   Report Status 01/22/2021 FINAL  Final   Organism ID, Bacteria STAPHYLOCOCCUS AUREUS  Final      Susceptibility   Staphylococcus aureus - MIC*    CIPROFLOXACIN >=8 RESISTANT Resistant     ERYTHROMYCIN <=0.25 SENSITIVE Sensitive     GENTAMICIN <=0.5 SENSITIVE Sensitive     OXACILLIN <=0.25 SENSITIVE Sensitive     TETRACYCLINE <=1 SENSITIVE Sensitive     VANCOMYCIN 1 SENSITIVE Sensitive     TRIMETH/SULFA <=10 SENSITIVE Sensitive     CLINDAMYCIN <=0.25 SENSITIVE Sensitive     RIFAMPIN <=0.5 SENSITIVE Sensitive     Inducible Clindamycin NEGATIVE Sensitive     * FEW STAPHYLOCOCCUS AUREUS    Imaging: X-ray 01/17/21 IMPRESSION: Soft tissue swelling.  No acute osseous abnormality identified.   ASSESSMENT & PLAN:    Human immunodeficiency virus (HIV) disease Well controlled on Triumeq.  Follow up with Terri Piedra as scheduled.   Infection of index finger, right Secondary to MSSA and doing well on Augmentin following I&D by Dr Sabra Heck on 01/19/21.  Will finish 10 day course of Augmentin which should adequately treat this infection.  Return precautions discussed.  Follow up with orthopedic surgery already planned.     Raynelle Highland for Infectious [facility] 01/24/2021, 3:54 PM

## 2021-02-08 ENCOUNTER — Other Ambulatory Visit: Payer: Self-pay

## 2021-02-08 ENCOUNTER — Ambulatory Visit: Payer: Self-pay

## 2021-02-10 NOTE — Progress Notes (Unsigned)
Therapist met with client as scheduled and continued rapport building. Therapist introduced and shared some information about herself and encouraged the same from client in order to build trust and openness within the counseling relationship. Therapist discussed / developed / reviewed treatment plan with client based on the result from client's assessment. Therapist discussed their current issues /diagnosis of DSM-5/as well as recommendations and target population eligibility services. Therapist assessed for SI/HI during session   Client met with therapist as scheduled. Client presented alert mood was euthymic; affect appeared to be congruent with client report of mood. Appearance was relaxed/casual, and attitude was appropriate and casual. Client was receptive to rapport building and goals implemented by therapist. Client shared some information about herself, such as: expressing likes, dislikes, and strengths. Client was able to make connections between consequences, challenges and alternative thoughts of mental health recovery. Client talked about some communities and resources that could be of help to his situation. He informed clinician that he had just been discharged from a three day stay at the hospital due to an infection in his finger. Client cut his finger with a box cutter and his finger swelled up causing a sever infection. Client reports that because of this incident he is no longer willing to continue working at his job. Client progress towards therapeutic goals remain minimal at this time. Client denied SI/HI.

## 2021-02-15 ENCOUNTER — Encounter: Payer: Self-pay | Admitting: Family

## 2021-02-15 ENCOUNTER — Other Ambulatory Visit: Payer: Self-pay

## 2021-02-15 ENCOUNTER — Ambulatory Visit (INDEPENDENT_AMBULATORY_CARE_PROVIDER_SITE_OTHER): Payer: Self-pay | Admitting: Family

## 2021-02-15 ENCOUNTER — Ambulatory Visit (INDEPENDENT_AMBULATORY_CARE_PROVIDER_SITE_OTHER): Payer: Self-pay

## 2021-02-15 VITALS — BP 130/85 | HR 86 | Temp 97.9°F | Wt 148.0 lb

## 2021-02-15 DIAGNOSIS — Z23 Encounter for immunization: Secondary | ICD-10-CM

## 2021-02-15 DIAGNOSIS — B20 Human immunodeficiency virus [HIV] disease: Secondary | ICD-10-CM

## 2021-02-15 DIAGNOSIS — Z0000 Encounter for general adult medical examination without abnormal findings: Secondary | ICD-10-CM

## 2021-02-15 DIAGNOSIS — F1021 Alcohol dependence, in remission: Secondary | ICD-10-CM

## 2021-02-15 MED ORDER — TRIUMEQ 600-50-300 MG PO TABS: 1.0000 | ORAL_TABLET | Freq: Every day | ORAL | 5 refills | Status: DC

## 2021-02-15 NOTE — Progress Notes (Signed)
   Covid-19 Vaccination Clinic  Name:  Arthur Lynch    MRN: [id number] DOB: [birth year]  02/15/2021  Mr. Treanor was observed post Covid-19 immunization for 15 minutes without incident. He was provided with Vaccine Information Sheet and instruction to access the V-Safe system.   Mr. Dubberly was instructed to call 911 with any severe reactions post vaccine: Difficulty breathing  Swelling of face and throat  A fast heartbeat  A bad rash all over body  Dizziness and weakness   Immunizations Administered     Name Date Dose VIS Date Route   Pfizer Covid-19 Vaccine Bivalent Booster 02/15/2021  4:39 PM 0.3 mL 11/10/2020 Intramuscular   Manufacturer: [place]   Lot: [id number]   [place]: [place]

## 2021-02-15 NOTE — Progress Notes (Signed)
Brief Narrative   Patient ID: Arthur Lynch, male    DOB: 02-04-1961, 60 y.o.   MRN: [id number]  Arthur Lynch is a 60 y/o AA male diagnosed with HIV disease with risk factor of MSM. Initial viral load and CD4 count are unavailable. Most recent genotype from March 2020 with no medication resistance mutations. [id number] negative. History of alcoholism effecting his medication adherence.   Subjective:    Chief Complaint  Patient presents with   Follow-up    B20    HPI:  Arthur Lynch is a 60 y.o. male with HIV disease last seen on 10/27/2020 with well-controlled virus and good adherence and tolerance to his ART regimen of Triumeq.  Viral load at the time was undetectable with CD4 count of 839.  In the interim was hospitalized for MSSA finger infection status post I&D and treatment with Augmentin.  Here today for routine follow-up.  Arthur Lynch continues to take his Triumeq daily as prescribed with no adverse side effects or missed doses.  Overall feeling well today with no new concerns/complaints.  Continues to remain in rehabilitation which is going well. Denies fevers, chills, night sweats, headaches, changes in vision, neck pain/stiffness, nausea, diarrhea, vomiting, lesions or rashes.  Arthur Lynch has no problems obtaining medication from the pharmacy and remains covered through [employer].  Denies feelings of being down, depressed, or hopeless recently.  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  No current recreational or illicit drug use, tobacco use, or alcohol consumption.  Healthcare maintenance due includes COVID booster.   Allergies  Allergen Reactions   Penicillins Itching    Has patient had a PCN reaction causing immediate rash, facial/tongue/throat swelling, SOB or lightheadedness with hypotension: No Has patient had a PCN reaction causing severe rash involving mucus membranes or skin necrosis: No Has patient had a PCN reaction that required hospitalization No Has patient  had a PCN reaction occurring within the last 10 years: No If all of the above answers are "NO", then may proceed with Cephalosporin use.       Outpatient Medications Prior to Visit  Medication Sig Dispense Refill   amitriptyline (ELAVIL) 50 MG tablet TAKE 1 TABLET(50 MG) BY MOUTH AT BEDTIME 30 tablet 5   abacavir-dolutegravir-lamiVUDine (TRIUMEQ) 600-50-300 MG tablet Take 1 tablet by mouth daily. 30 tablet 5   No facility-administered medications prior to visit.     Past Medical History:  Diagnosis Date   Abnormal Pap smear    Alcoholism in remission ([place]) 08/16/2015   Anemia    Atypical chest pain 10/06/2015   Cancer (HCC)    COPD (chronic obstructive pulmonary disease) (HCC)    Depression    Eczema 10/06/2015   HIV (human immunodeficiency virus infection) ([place])    Murmur, heart    Neuromuscular disorder ([place])    Substance abuse ([place])      Past Surgical History:  Procedure Laterality Date   ANOSCOPY        Review of Systems  Constitutional:  Negative for appetite change, chills, fatigue, fever and unexpected weight change.  Eyes:  Negative for visual disturbance.  Respiratory:  Negative for cough, chest tightness, shortness of breath and wheezing.   Cardiovascular:  Negative for chest pain and leg swelling.  Gastrointestinal:  Negative for abdominal pain, constipation, diarrhea, nausea and vomiting.  Genitourinary:  Negative for dysuria, flank pain, frequency, genital sores, hematuria and urgency.  Skin:  Negative for rash.  Allergic/Immunologic: Negative for immunocompromised state.  Neurological:  Negative for dizziness and headaches.     Objective:    BP 130/85 (BP Location: Left Arm)   Pulse 86   Temp 97.9 F (36.6 C) (Oral)   Wt 148 lb (67.1 kg)   BMI 19.53 kg/m  Nursing note and vital signs reviewed.  Physical Exam Constitutional:      General: He is not in acute distress.    Appearance: He is well-developed.  Eyes:     Conjunctiva/sclera:  Conjunctivae normal.  Cardiovascular:     Rate and Rhythm: Normal rate and regular rhythm.     Heart sounds: Normal heart sounds. No murmur heard.   No friction rub. No gallop.  Pulmonary:     Effort: Pulmonary effort is normal. No respiratory distress.     Breath sounds: Normal breath sounds. No wheezing or rales.  Chest:     Chest wall: No tenderness.  Abdominal:     General: Bowel sounds are normal.     Palpations: Abdomen is soft.     Tenderness: There is no abdominal tenderness.  Musculoskeletal:     Cervical back: Neck supple.  Lymphadenopathy:     Cervical: No cervical adenopathy.  Skin:    General: Skin is warm and dry.     Findings: No rash.  Neurological:     Mental Status: He is alert and oriented to person, place, and time.  Psychiatric:        Behavior: Behavior normal.        Thought Content: Thought content normal.        Judgment: Judgment normal.     Depression screen [facility] 2/9 02/15/2021 01/24/2021 07/08/2020 01/13/2020 11/24/2019  Decreased Interest 0 0 0 0 0  Down, Depressed, Hopeless 0 0 0 0 0  PHQ - 2 Score 0 0 0 0 0  Altered sleeping - - - - -  Tired, decreased energy - - - - -  Change in appetite - - - - -  Feeling bad or failure about yourself  - - - - -  Trouble concentrating - - - - -  Moving slowly or fidgety/restless - - - - -  Suicidal thoughts - - - - -  PHQ-9 Score - - - - -  Some recent data might be hidden       Assessment & Plan:    Patient Active Problem List   Diagnosis Date Noted   Infection of index finger, right 01/19/2021   Stenosing tenosynovitis of finger 01/17/2021   Viral gastroenteritis 09/16/2018   Healthcare maintenance 07/02/2018   Infection of lip 07/28/2016   Infected lip laceration 07/28/2016   Alcohol dependence with acute alcoholic intoxication ([place]) 07/25/2016   Major depressive disorder, recurrent severe without psychotic features ([place]) 07/25/2016   MDD (major depressive disorder) 07/25/2016   Atypical chest  pain 10/06/2015   Eczema 10/06/2015   Alcoholism in remission ([place]) 08/16/2015   HIV disease ([place]) 06/04/2014   HZV (herpes zoster virus) post herpetic neuralgia 06/04/2014   Rash and nonspecific skin eruption 05/11/2014   Rib pain on left side 10/15/2013   Abnormal Pap smear    ASCUS (atypical squamous cells of undetermined significance) on Pap smear 07/24/2012   Insomnia 12/20/2011   Anemia 06/21/2011   Gynecomastia 05/31/2011   Zoster ophthalmicus 05/31/2011   Headache(784.0) 02/15/2011   Neuropathic pain 11/02/2010   Cyst on ear 11/02/2010   ACUTE GASTRITIS WITHOUT MENTION OF HEMORRHAGE 12/29/2009   CONSTIPATION 12/29/2009   Alcohol dependence with alcohol-induced mood  disorder ([place]) 06/07/2009   HERPES ZOSTER 12/07/2008   NEURALGIA, TRIGEMINAL 12/07/2008   ANEMIA, MACROCYTIC, CHRONIC 11/23/2008   POLYURIA 11/23/2008   NOCTURIA 11/23/2008   GYNECOMASTIA 09/21/2008   CARBUNCLE AND FURUNCLE OF UNSPECIFIED SITE 06/19/2008   Leukocytopenia 03/16/2008   ROSACEA 07/22/2007   EDEMA 07/22/2007   PAROXYSMAL NOCTURNAL DYSPNEA 04/01/2007   Pruritic disorder 01/24/2007   DIARRHEA 01/24/2007   Major depressive disorder with current active episode 12/10/2006   Human immunodeficiency virus (HIV) disease ([place]) 01/13/2006     Problem List Items Addressed This Visit       Other   HIV disease ([place]) - Primary    Arthur Lynch continues to have well-controlled virus with good adherence and tolerance to his ART regimen of Triumeq.  No signs/symptoms of opportunistic infection.  We reviewed lab work and discussed plan of care.  Continue current dose of Triumeq.  Check blood work today.  Plan for follow-up in 4 months or sooner if needed.  He will need to renew his financial assistance after January 1.      Relevant Medications   abacavir-dolutegravir-lamiVUDine (TRIUMEQ) 600-50-300 MG tablet   Other Relevant Orders   HIV-1 RNA quant-no reflex-bld   T-helper cell (CD4)- ([facility] clinic only)    Alcoholism in remission [facility])    Arthur Lynch continues to remain sober for 10 months now and continues to do well working full-time now has his own room. Congratulated on these efforts and accomplishments. Appears to have good support around him.       Healthcare maintenance    Discussed importance of safe sexual practices and condom use.  Condoms offered. COVID booster updated.        I am having Flonnie Hailstone maintain his amitriptyline and Triumeq.   Meds ordered this encounter  Medications   abacavir-dolutegravir-lamiVUDine (TRIUMEQ) 600-50-300 MG tablet    Sig: Take 1 tablet by mouth daily.    Dispense:  30 tablet    Refill:  5    Order Specific Question:   Supervising Provider    Answer:   Carlyle Basques [[id number]]     Follow-up: Return in about 4 months (around 06/16/2021), or if symptoms worsen or fail to improve.   Terri Piedra, MSN, FNP-C Nurse Practitioner [facility] [place] number: [phone]

## 2021-02-15 NOTE — Patient Instructions (Addendum)
Nice to see you.  We will check your lab work today.  Continue to take your medication daily as prescribed.  Refills have been sent to the pharmacy.  Plan for follow up in 4 months or sooner if needed with lab work on the same day.  Have a great day and stay safe!  

## 2021-02-15 NOTE — Assessment & Plan Note (Signed)
   Discussed importance of safe sexual practices and condom use.  Condoms offered.  COVID booster updated.

## 2021-02-15 NOTE — Assessment & Plan Note (Signed)
Mr. Kinsel continues to have well-controlled virus with good adherence and tolerance to his ART regimen of Triumeq.  No signs/symptoms of opportunistic infection.  We reviewed lab work and discussed plan of care.  Continue current dose of Triumeq.  Check blood work today.  Plan for follow-up in 4 months or sooner if needed.  He will need to renew his financial assistance after January 1.

## 2021-02-15 NOTE — Assessment & Plan Note (Signed)
Arthur Lynch continues to remain sober for 10 months now and continues to do well working full-time now has his own room. Congratulated on these efforts and accomplishments. Appears to have good support around him.

## 2021-02-16 ENCOUNTER — Ambulatory Visit: Payer: Self-pay | Admitting: Family

## 2021-02-16 LAB — T-HELPER CELL (CD4) - (RCID CLINIC ONLY)
CD4 % Helper T Cell: 42 % (ref 33–65)
CD4 T Cell Abs: 780 /uL (ref 400–1790)

## 2021-02-17 LAB — HIV-1 RNA QUANT-NO REFLEX-BLD
HIV 1 RNA Quant: NOT DETECTED Copies/mL
HIV-1 RNA Quant, Log: NOT DETECTED Log cps/mL

## 2021-03-01 ENCOUNTER — Other Ambulatory Visit: Payer: Self-pay

## 2021-03-01 ENCOUNTER — Ambulatory Visit: Payer: Self-pay

## 2021-03-10 NOTE — Progress Notes (Unsigned)
Therapist met with client as scheduled and continued rapport building. Therapist introduced and shared some information about herself and encouraged the same from client in order to build trust and openness within the counseling relationship. Therapist discussed / developed / reviewed treatment plan with client based on the result from client's assessment. Therapist discussed their current issues /diagnosis of DSM-5/as well as recommendations and target population eligibility services. Therapist assessed for SI/HI during session   Client met with therapist as scheduled. Client presented alert mood was euthymic; affect appeared to be congruent with client report of mood. Appearance was relaxed/casual, and attitude was appropriate and casual. Client was receptive to rapport building and goals implemented by therapist. Client shared some information about herself, such as: expressing likes, dislikes, and strengths. Client was able to make connections between consequences, challenges and alternative thoughts of mental health recovery. 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 Client states that he is wanting to take a new job and has one lined up for after the holidays. Client also talked extensively about how his residential house manager is attempting to cause issues during his stay there. Client states that he remains positive about his situation and maintains his motivation for continued remission.  Client progress towards therapeutic goals remain minimal at this time. Client denied SI/HI.

## 2021-03-22 ENCOUNTER — Ambulatory Visit: Payer: Self-pay

## 2021-03-22 ENCOUNTER — Other Ambulatory Visit: Payer: Self-pay

## 2021-03-24 ENCOUNTER — Encounter: Payer: Self-pay | Admitting: Family

## 2021-03-24 NOTE — Progress Notes (Unsigned)
Therapist met with client as scheduled and discussed treatment plan/case management progress. Therapist used active listening skills to provide client with opportunity to disclose previous challenges and successes. Therapist assessed for SI/HI during session and will follow-up with client during the next session.   Client met with therapist as scheduled. Client presented alert mood was euthymic; affect appeared to be congruent with client report of mood. Appearance was relaxed/casual, and attitude was appropriate and casual. Client was receptive to rapport building and goals implemented by therapist. Client was able to make connections between consequences, challenges and alternative thoughts of mental health recovery. 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 Client states that he is wanting to take a new job and has one lined up but has not elaborated on when he will execute the transfer. Client also talked extensively about how his residential house manager and how they finally came to a consensus about their temperament. Client states that he remains positive about his situation and maintains his motivation for continued remission. Client is still motivated about finding a home, however, understands he may not be ready for independent living. Client reports that he will sign another agreement to continue his stay there at the residential treatment facility. Client progress towards therapeutic goals remain minimal at this time. Client denied SI/HI.

## 2021-04-26 ENCOUNTER — Other Ambulatory Visit: Payer: Self-pay

## 2021-04-26 ENCOUNTER — Ambulatory Visit: Payer: Self-pay

## 2021-04-28 NOTE — Progress Notes (Unsigned)
Therapist met with client as scheduled and discussed treatment plan/case management progress. Therapist used active listening skills to provide client with opportunity to disclose previous challenges and successes. Therapist assessed for SI/HI during session and will follow-up with client during the next session.   Client met with therapist as scheduled. Client presented alert mood was happy; affect appeared to be congruent with client report of mood. Appearance was relaxed/casual, and attitude was appropriate and casual. Client was receptive to rapport building and goals implemented by therapist. Client was able to make connections between consequences, challenges and alternative thoughts of mental health recovery. Client continues to talk about his successes and challenges with the next steps in his life. Client talked about his SA graduation and remission event. Client talked about his family in attendance; including his decorations and the food. Client processed how he would move forward and work toward to his [place] certificate. Client denies SI/HI during session. Therapeutic goals for client appear moderate at this time.

## 2021-04-28 NOTE — Progress Notes (Deleted)
Therapist met with client as scheduled and discussed treatment plan/case management progress. Therapist used active listening skills to provide client with opportunity to disclose previous challenges and successes. Therapist assessed for SI/HI during session and will follow-up with client during the next session.   Client met with therapist as scheduled. Client presented alert mood was happy; affect appeared to be congruent with client report of mood. Appearance was relaxed/casual, and attitude was appropriate and casual. Client was receptive to rapport building and goals implemented by therapist. Client was able to make connections between consequences, challenges and alternative thoughts of mental health recovery. Client continues to talk about his successes and challenges with the next steps in his life. Client talked about his SA graduation and remission event. Client talked about his family in attendance; including his decorations and the food. Client processed how he would move forward and work toward to his [employer] certificate. Therapeutic goals for client appear moderate at this time.

## 2021-05-24 ENCOUNTER — Ambulatory Visit: Payer: Self-pay

## 2021-05-26 ENCOUNTER — Telehealth: Payer: Self-pay

## 2021-05-26 MED ORDER — AMITRIPTYLINE HCL 25 MG PO TABS: 25.0000 mg | ORAL_TABLET | Freq: Every day | ORAL | 5 refills | Status: DC

## 2021-05-26 NOTE — Telephone Encounter (Signed)
Attempted to call patient regarding dose change on rx. Voicemail is not setup. ?Leatrice Jewels, RMA ? ?

## 2021-05-26 NOTE — Telephone Encounter (Signed)
Patient called office today requesting new rx for Amitriptyline. Would like to know if [place], FNP could decrease the dose. States that when he takes it he feels more tired. Feels like when he does not take it he feels "nice vibes" and not too tired.  ?Will forward message to Md. ?Leatrice Jewels, RMA ? ?

## 2021-05-26 NOTE — Telephone Encounter (Signed)
I have sent a new prescription for amitriptyline to the pharmacy at a lower dose of 25 mg. He can take 1/2 tablet daily until he completes his current bottle and the refill will be 25 mg.  ? ?Terri Piedra, NP ?05/26/2021 ?11:27 AM ? ?

## 2021-05-26 NOTE — Addendum Note (Signed)
Addended by: Mauricio Po D on: 05/26/2021 11:27 AM ? ? Modules accepted: Orders ? ?

## 2021-05-27 NOTE — Telephone Encounter (Signed)
Patient returned call, relayed per Terri Piedra, NP that amitriptyline dose has been changed to '25mg'$  and that he can take half a pill of his current '50mg'$  bottle until he gets the new prescription.  ? ?Patient is currently at [facility] treatment facility. He is asking for documentation for the facility in regards to the dose change. Will send patient a MyChart message with medication information for him to share with the facility.  ? ?Beryle Flock, RN ? ?

## 2021-05-27 NOTE — Telephone Encounter (Signed)
Second attempt made to update patient regarding prescription. Voicemail is not setup.  ?Arthur Lynch, RMA ? ?

## 2021-06-28 ENCOUNTER — Ambulatory Visit: Payer: Self-pay

## 2021-06-28 ENCOUNTER — Other Ambulatory Visit: Payer: Self-pay

## 2021-06-28 ENCOUNTER — Ambulatory Visit (INDEPENDENT_AMBULATORY_CARE_PROVIDER_SITE_OTHER): Payer: Self-pay | Admitting: Family

## 2021-06-28 ENCOUNTER — Encounter: Payer: Self-pay | Admitting: Family

## 2021-06-28 VITALS — BP 116/75 | HR 66 | Temp 97.6°F | Wt 151.0 lb

## 2021-06-28 DIAGNOSIS — Z0000 Encounter for general adult medical examination without abnormal findings: Secondary | ICD-10-CM

## 2021-06-28 DIAGNOSIS — B20 Human immunodeficiency virus [HIV] disease: Secondary | ICD-10-CM

## 2021-06-28 DIAGNOSIS — Z113 Encounter for screening for infections with a predominantly sexual mode of transmission: Secondary | ICD-10-CM

## 2021-06-28 MED ORDER — TRIUMEQ 600-50-300 MG PO TABS: 1.0000 | ORAL_TABLET | Freq: Every day | ORAL | 5 refills | Status: DC

## 2021-06-28 MED ORDER — AMITRIPTYLINE HCL 10 MG PO TABS: 10.0000 mg | ORAL_TABLET | Freq: Every day | ORAL | 5 refills | Status: DC

## 2021-06-28 NOTE — Progress Notes (Unsigned)
Therapist met with client as scheduled and discussed their progress. Therapist used active listening skills to provide client with opportunity to disclose previous challenges and successes since last session. Specific problem-solving skills were processed with client, including breaking down problems, brainstorming, evaluating, and choosing options. At times implementing a plan and evaluating and reevaluating results. Therapist assessed for SI/HI during session and will follow-up with client during the next session.  ? ?Client met with therapist as scheduled and presented alert; orient X4. At the start of session, client affect appeared euthymic; affect appeared to be congruent with client report. Client struggled making connections between consequences, challenges and alternative thoughts of mental health recovery during this session. Client expressed their needs in the development of their treatment goals and described different communities of which can serve as an added support such as the various job opportunities. Client informed clinician that he is no longer working at his current job and his looking for another. Client states he needs a job more his temperament. Client remains confused as to the outcome of his relationship; as he and his current partner have different relationship goals. Client progress toward therapeutic goal(s) remain minimal at this time. Client denied SI/HI. ?

## 2021-06-28 NOTE — Progress Notes (Signed)
? ? ?Brief Narrative  ? ?Patient ID: Arthur Lynch, male    DOB: [birth year], 61 y.o.   MRN: [id number] ? ?[name] is a 61 y/o AA male diagnosed with HIV disease with risk factor of MSM. Initial viral load and CD4 count are unavailable. Most recent genotype from March 2020 with no medication resistance mutations. [id number] negative. History of alcoholism effecting his medication adherence.  ? ?Subjective:  ?  ?Chief Complaint  ?Patient presents with  ? Follow-up  ? ? ?HPI: ? ?[name] is a 61 y.o. male with HIV disease last seen on 02/15/2021 with well-controlled virus and good adherence and tolerance to his ART regimen of Triumeq.  Viral load was undetectable with CD4 count of 780.  Here today for routine follow-up. ? ?[name] continues to take his Biktarvy daily as prescribed with no adverse side effects.  Overall feeling well today with no new concerns/complaints.  Would like to decrease amitriptyline down to 10 mg daily as he feels he is needing less. Denies fevers, chills, night sweats, headaches, changes in vision, neck pain/stiffness, nausea, diarrhea, vomiting, lesions or rashes. ? ?[name] has no problems obtaining medication from the pharmacy and remains covered by [employer].  Denies feelings of being down, depressed, or hopeless recently.  Continues to follow-up with counseling.  No current recreational illicit drug use, tobacco use, or alcohol consumption.  Currently working on finding an new job as he would like something more mental and less physical.  Interested in going back to school to become a Immunologist.  Condoms offered.  All vaccinations are up-to-date per recommendations. ? ? ?Allergies  ?Allergen Reactions  ? Penicillins Itching  ?  Has patient had a PCN reaction causing immediate rash, facial/tongue/throat swelling, SOB or lightheadedness with hypotension: No ?Has patient had a PCN reaction causing severe rash involving mucus membranes or skin necrosis: No ?Has patient had a PCN  reaction that required hospitalization No ?Has patient had a PCN reaction occurring within the last 10 years: No ?If all of the above answers are "NO", then may proceed with Cephalosporin use. ?  ? ? ? ? ?Outpatient Medications Prior to Visit  ?Medication Sig Dispense Refill  ? abacavir-dolutegravir-lamiVUDine (TRIUMEQ) 600-50-300 MG tablet Take 1 tablet by mouth daily. 30 tablet 5  ? amitriptyline (ELAVIL) 25 MG tablet Take 1 tablet (25 mg total) by mouth at bedtime. 30 tablet 5  ? ?No facility-administered medications prior to visit.  ? ? ? ?Past Medical History:  ?Diagnosis Date  ? Abnormal Pap smear   ? Alcoholism in remission ([place]) 08/16/2015  ? Anemia   ? Atypical chest pain 10/06/2015  ? Cancer [facility])   ? COPD (chronic obstructive pulmonary disease) ([place])   ? Depression   ? Eczema 10/06/2015  ? HIV (human immunodeficiency virus infection) ([place])   ? Murmur, heart   ? Neuromuscular disorder ([place])   ? Substance abuse ([place])   ? ? ? ?Past Surgical History:  ?Procedure Laterality Date  ? ANOSCOPY    ? ? ? ? ?Review of Systems  ?Constitutional:  Negative for appetite change, chills, fatigue, fever and unexpected weight change.  ?Eyes:  Negative for visual disturbance.  ?Respiratory:  Negative for cough, chest tightness, shortness of breath and wheezing.   ?Cardiovascular:  Negative for chest pain and leg swelling.  ?Gastrointestinal:  Negative for abdominal pain, constipation, diarrhea, nausea and vomiting.  ?Genitourinary:  Negative for dysuria, flank pain, frequency, genital sores, hematuria and urgency.  ?Skin:  Negative for rash.  ?Allergic/Immunologic: Negative for immunocompromised state.  ?Neurological:  Negative for dizziness and headaches.  ?   ?Objective:  ?  ?BP 116/75   Pulse 66   Temp 97.6 ?F (36.4 ?C) (Temporal)   Wt 151 lb (68.5 kg)   BMI 19.92 kg/m?  ?Nursing note and vital signs reviewed. ? ?Physical Exam ?Constitutional:   ?   General: He is not in acute distress. ?   Appearance: He is  well-developed.  ?Eyes:  ?   Conjunctiva/sclera: Conjunctivae normal.  ?Cardiovascular:  ?   Rate and Rhythm: Normal rate and regular rhythm.  ?   Heart sounds: Normal heart sounds. No murmur heard. ?  No friction rub. No gallop.  ?Pulmonary:  ?   Effort: Pulmonary effort is normal. No respiratory distress.  ?   Breath sounds: Normal breath sounds. No wheezing or rales.  ?Chest:  ?   Chest wall: No tenderness.  ?Abdominal:  ?   General: Bowel sounds are normal.  ?   Palpations: Abdomen is soft.  ?   Tenderness: There is no abdominal tenderness.  ?Musculoskeletal:  ?   Cervical back: Neck supple.  ?Lymphadenopathy:  ?   Cervical: No cervical adenopathy.  ?Skin: ?   General: Skin is warm and dry.  ?   Findings: No rash.  ?Neurological:  ?   Mental Status: He is alert and oriented to person, place, and time.  ?Psychiatric:     ?   Behavior: Behavior normal.     ?   Thought Content: Thought content normal.     ?   Judgment: Judgment normal.  ? ? ? ? ?  06/28/2021  ?  2:57 PM 02/15/2021  ?  3:09 PM 01/24/2021  ?  3:30 PM 07/08/2020  ?  4:41 PM 01/13/2020  ?  9:05 AM  ?Depression screen PHQ 2/9  ?Decreased Interest 0 0 0 0 0  ?Down, Depressed, Hopeless 1 0 0 0 0  ?PHQ - 2 Score 1 0 0 0 0  ?  ?   ?Assessment & Plan:  ? ? ?Patient Active Problem List  ? Diagnosis Date Noted  ? Infection of index finger, right 01/19/2021  ? Stenosing tenosynovitis of finger 01/17/2021  ? Viral gastroenteritis 09/16/2018  ? Healthcare maintenance 07/02/2018  ? Infection of lip 07/28/2016  ? Infected lip laceration 07/28/2016  ? Alcohol dependence with acute alcoholic intoxication ([place]) 07/25/2016  ? Major depressive disorder, recurrent severe without psychotic features ([place]) 07/25/2016  ? MDD (major depressive disorder) 07/25/2016  ? Atypical chest pain 10/06/2015  ? Eczema 10/06/2015  ? Alcoholism in remission ([place]) 08/16/2015  ? HIV disease ([place]) 06/04/2014  ? HZV (herpes zoster virus) post herpetic neuralgia 06/04/2014  ? Rash and nonspecific  skin eruption 05/11/2014  ? Rib pain on left side 10/15/2013  ? Abnormal Pap smear   ? ASCUS (atypical squamous cells of undetermined significance) on Pap smear 07/24/2012  ? Insomnia 12/20/2011  ? Anemia 06/21/2011  ? Gynecomastia 05/31/2011  ? Zoster ophthalmicus 05/31/2011  ? Headache(784.0) 02/15/2011  ? Neuropathic pain 11/02/2010  ? Cyst on ear 11/02/2010  ? ACUTE GASTRITIS WITHOUT MENTION OF HEMORRHAGE 12/29/2009  ? CONSTIPATION 12/29/2009  ? Alcohol dependence with alcohol-induced mood disorder ([place]) 06/07/2009  ? HERPES ZOSTER 12/07/2008  ? NEURALGIA, TRIGEMINAL 12/07/2008  ? ANEMIA, MACROCYTIC, CHRONIC 11/23/2008  ? POLYURIA 11/23/2008  ? NOCTURIA 11/23/2008  ? GYNECOMASTIA 09/21/2008  ? CARBUNCLE AND FURUNCLE OF UNSPECIFIED SITE 06/19/2008  ? Leukocytopenia  03/16/2008  ? ROSACEA 07/22/2007  ? EDEMA 07/22/2007  ? PAROXYSMAL NOCTURNAL DYSPNEA 04/01/2007  ? Pruritic disorder 01/24/2007  ? DIARRHEA 01/24/2007  ? Major depressive disorder with current active episode 12/10/2006  ? Human immunodeficiency virus (HIV) disease ([place]) 01/13/2006  ? ? ? ?Problem List Items Addressed This Visit   ? ?  ? Other  ? HIV disease ([place])  ?  Mr. Petrow continues to have well-controlled virus with good adherence and tolerance to his ART regimen of Biktarvy.  We reviewed previous lab work and discussed plan of care.  Check blood work today.  Continue current dose of Biktarvy.  Plan for follow-up in 4 months or sooner if needed with lab work on the same day. ? ?  ?  ? Relevant Medications  ? abacavir-dolutegravir-lamiVUDine (TRIUMEQ) 600-50-300 MG tablet  ? Other Relevant Orders  ? Comprehensive metabolic panel  ? HIV-1 RNA quant-no reflex-bld  ? T-helper cell (CD4)- ([facility] clinic only)  ? Healthcare maintenance - Primary  ?  Discussed importance of safe sexual practices and condom use.  Condoms offered. ?Routine vaccinations up-to-date per recommendations. ?Routine dental care up-to-date per recommendations. ?  ?  ? ?Other Visit  Diagnoses   ? ? Screening for STDs (sexually transmitted diseases)      ? Relevant Orders  ? RPR  ? ?  ? ? ? ?I have discontinued [name] E. Kelnhofer's amitriptyline. I am also having him start on amitriptyline. Additional

## 2021-06-28 NOTE — Assessment & Plan Note (Signed)
?   Discussed importance of safe sexual practices and condom use.  Condoms offered. ?? Routine vaccinations up-to-date per recommendations. ?? Routine dental care up-to-date per recommendations. ?

## 2021-06-28 NOTE — Assessment & Plan Note (Signed)
Mr. Zangara continues to have well-controlled virus with good adherence and tolerance to his ART regimen of Biktarvy.  We reviewed previous lab work and discussed plan of care.  Check blood work today.  Continue current dose of Biktarvy.  Plan for follow-up in 4 months or sooner if needed with lab work on the same day. ?

## 2021-06-28 NOTE — Patient Instructions (Addendum)
Nice to see you.  We will check your lab work today.  Continue to take your medication daily as prescribed.  Refills have been sent to the pharmacy.  Plan for follow up in 4 months or sooner if needed with lab work on the same day.  Have a great day and stay safe!  

## 2021-06-30 LAB — HELPER T-LYMPH-CD4 (ARMC ONLY)
% CD 4 Pos. Lymph.: 41.9 % (ref 30.8–58.5)
Absolute CD 4 Helper: 796 /uL (ref 359–1519)
Basophils Absolute: 0 10*3/uL (ref 0.0–0.2)
Basos: 0 %
EOS (ABSOLUTE): 0.1 10*3/uL (ref 0.0–0.4)
Eos: 3 %
Hematocrit: 33.7 % — ABNORMAL LOW (ref 37.5–51.0)
Hemoglobin: 11.3 g/dL — ABNORMAL LOW (ref 13.0–17.7)
Immature Grans (Abs): 0 10*3/uL (ref 0.0–0.1)
Immature Granulocytes: 0 %
Lymphocytes Absolute: 1.9 10*3/uL (ref 0.7–3.1)
Lymphs: 57 %
MCH: 34.5 pg — ABNORMAL HIGH (ref 26.6–33.0)
MCHC: 33.5 g/dL (ref 31.5–35.7)
MCV: 103 fL — ABNORMAL HIGH (ref 79–97)
Monocytes Absolute: 0.3 10*3/uL (ref 0.1–0.9)
Monocytes: 8 %
Neutrophils Absolute: 1 10*3/uL — ABNORMAL LOW (ref 1.4–7.0)
Neutrophils: 32 %
Platelets: 302 10*3/uL (ref 150–450)
RBC: 3.28 x10E6/uL — ABNORMAL LOW (ref 4.14–5.80)
RDW: 12.6 % (ref 11.6–15.4)
WBC: 3.2 10*3/uL — ABNORMAL LOW (ref 3.4–10.8)

## 2021-07-02 LAB — COMPREHENSIVE METABOLIC PANEL
AG Ratio: 1.2 (calc) (ref 1.0–2.5)
ALT: 19 U/L (ref 9–46)
AST: 29 U/L (ref 10–35)
Albumin: 4.1 g/dL (ref 3.6–5.1)
Alkaline phosphatase (APISO): 89 U/L (ref 35–144)
BUN: 17 mg/dL (ref 7–25)
CO2: 28 mmol/L (ref 20–32)
Calcium: 9.5 mg/dL (ref 8.6–10.3)
Chloride: 106 mmol/L (ref 98–110)
Creat: 1.22 mg/dL (ref 0.70–1.30)
Globulin: 3.5 g/dL (calc) (ref 1.9–3.7)
Glucose, Bld: 87 mg/dL (ref 65–99)
Potassium: 4.3 mmol/L (ref 3.5–5.3)
Sodium: 138 mmol/L (ref 135–146)
Total Bilirubin: 0.4 mg/dL (ref 0.2–1.2)
Total Protein: 7.6 g/dL (ref 6.1–8.1)

## 2021-07-02 LAB — HIV-1 RNA QUANT-NO REFLEX-BLD
HIV 1 RNA Quant: <20 copies/mL — AB
HIV-1 RNA Quant, Log: <1.3 Log copies/mL — AB

## 2021-07-02 LAB — RPR: RPR Ser Ql: NONREACTIVE

## 2021-07-05 LAB — T-HELPER CELL (CD4) - (RCID CLINIC ONLY)

## 2021-08-22 IMAGING — MG DIGITAL DIAGNOSTIC BILAT W/ TOMO W/ CAD
6 of 10 series · 6 of 30 positions shown · non-contrast
Comparison: Previous exam(s).

CLINICAL DATA: Palpable lump in the right retroareolar region.

EXAM:
DIGITAL DIAGNOSTIC BILATERAL MAMMOGRAM WITH TOMOSYNTHESIS AND CAD
TECHNIQUE: Bilateral digital diagnostic mammography and breast tomosynthesis
was performed. The images were evaluated with computer-aided
detection.

[R MLO synth-2D]
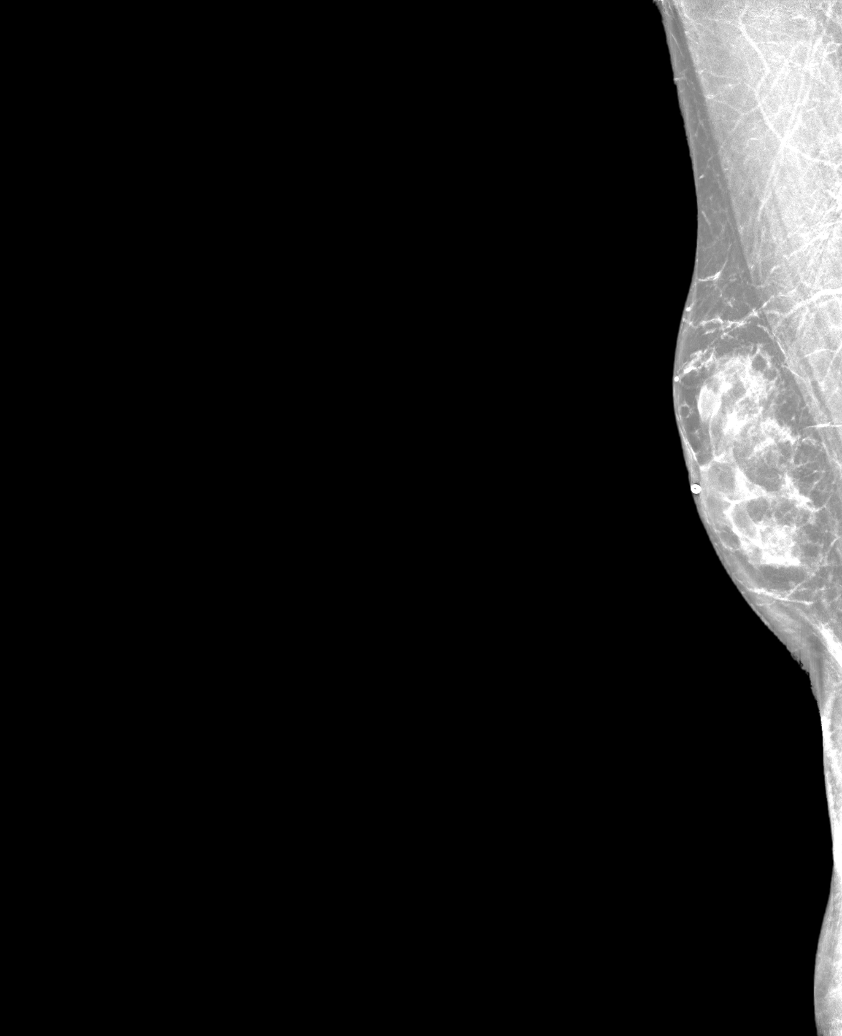

[R TAN synth-2D]
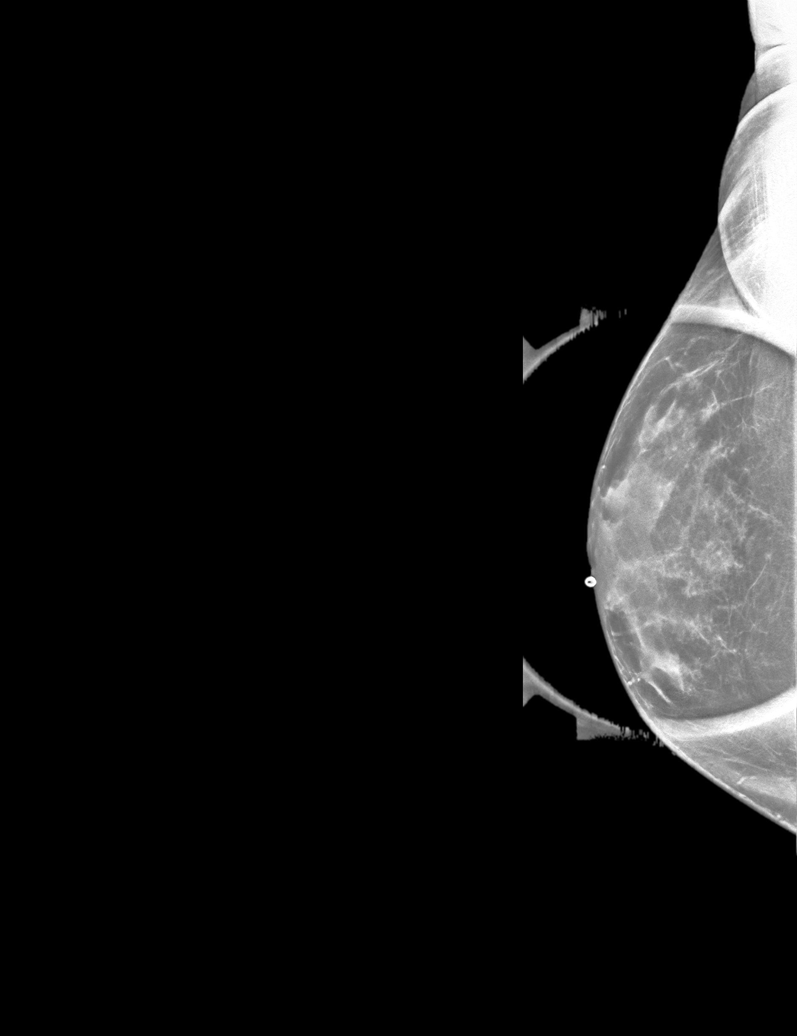

[R CC synth-2D]
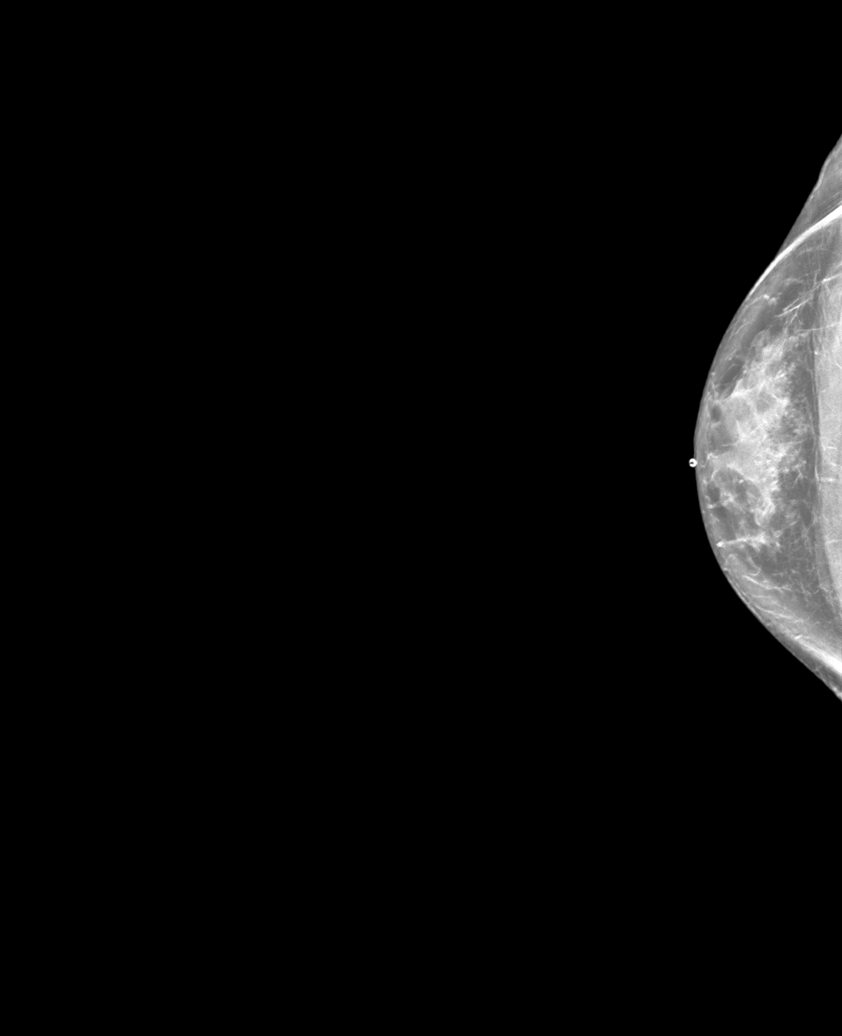

[L MLO synth-2D]
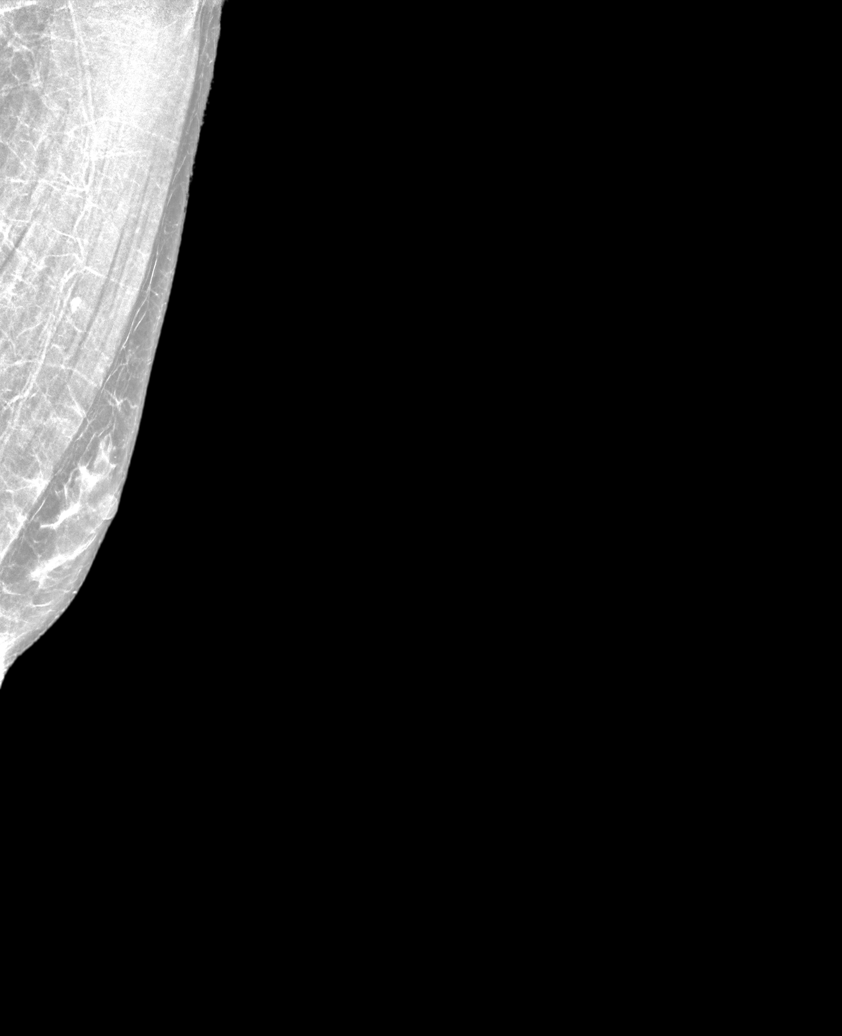

[L CC synth-2D]
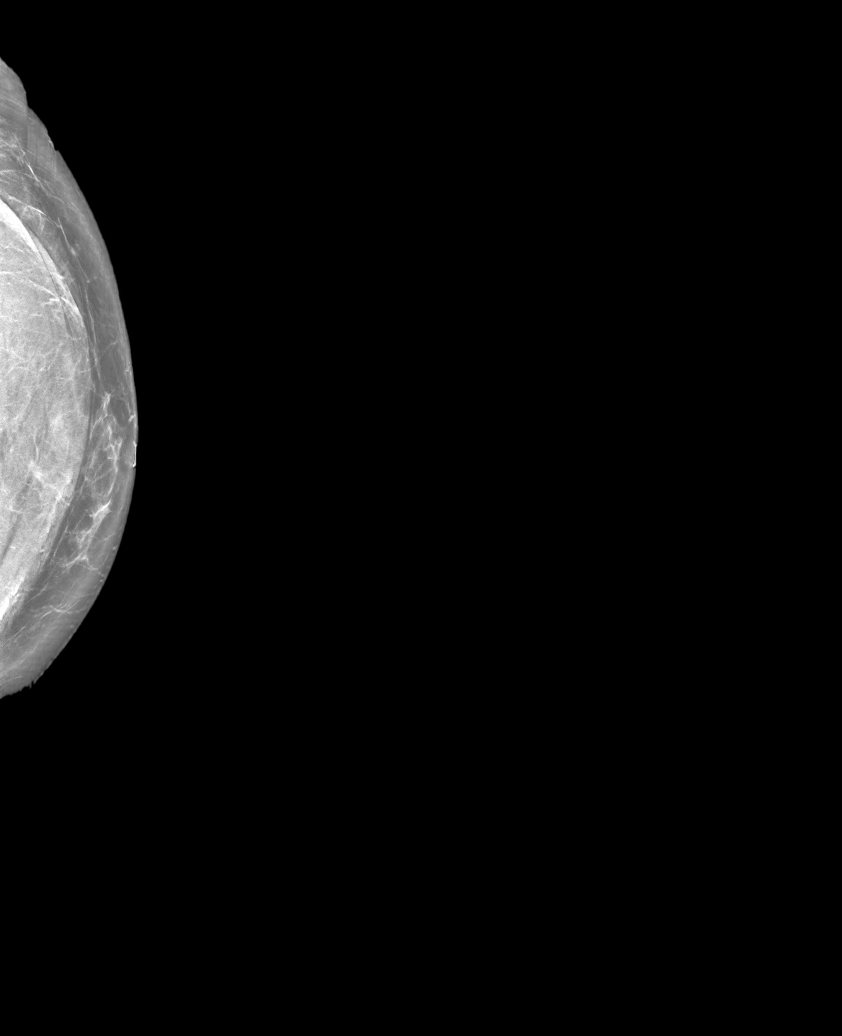

[R CC tomo · tomo slice 38/75.0]
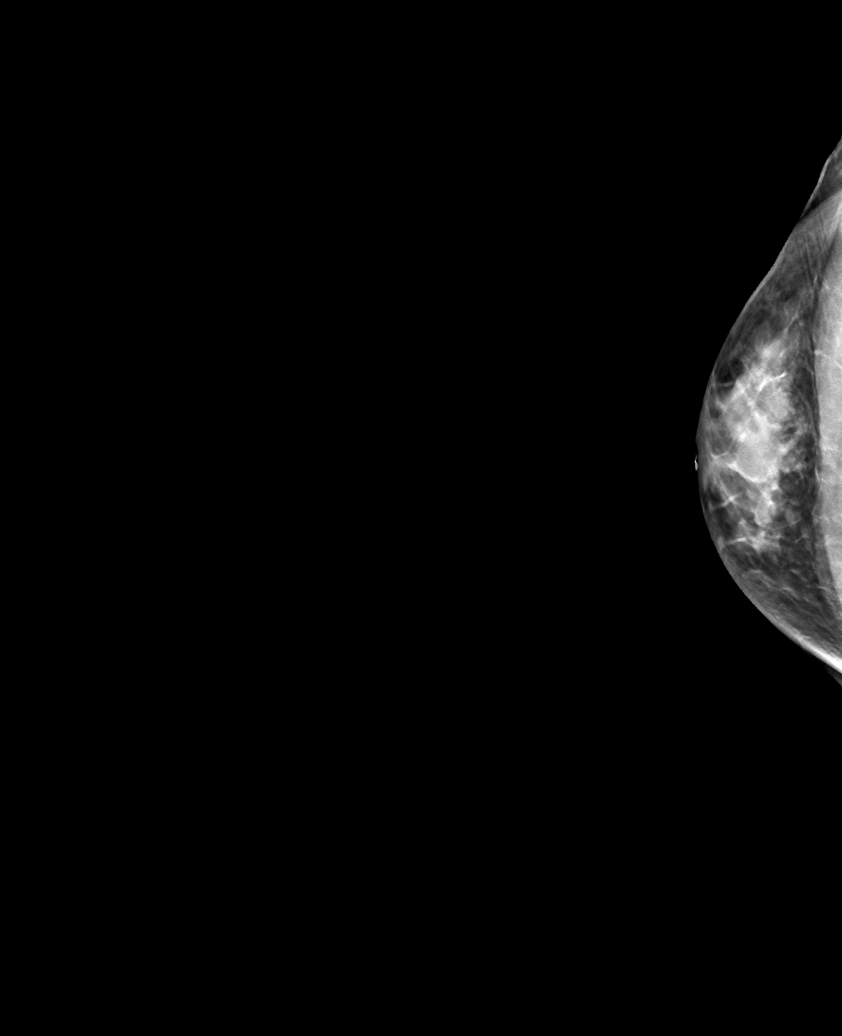

[6 of 30 positions shown; findings below may reference images not displayed]

ACR Breast Density Category b: There are scattered areas of
fibroglandular density.
FINDINGS: The patient has bilateral gynecomastia, right greater than left. The
patient is palpating his right-sided gynecomastia.
IMPRESSION: Right greater than left gynecomastia.  No evidence of malignancy.

RECOMMENDATION:
Treatment of the patient's gynecomastia should be based on clinical
and physical exam given lack of imaging findings. No imaging
follow-up is necessary.

I have discussed the findings and recommendations with the patient.
If applicable, a reminder letter will be sent to the patient
regarding the next appointment.

BI-RADS CATEGORY  2: Benign.

## 2021-10-25 ENCOUNTER — Encounter: Payer: Self-pay | Admitting: Family

## 2021-10-25 ENCOUNTER — Ambulatory Visit: Payer: Self-pay

## 2021-10-25 ENCOUNTER — Other Ambulatory Visit: Payer: Self-pay

## 2021-10-25 ENCOUNTER — Ambulatory Visit (INDEPENDENT_AMBULATORY_CARE_PROVIDER_SITE_OTHER): Payer: Self-pay | Admitting: Family

## 2021-10-25 VITALS — BP 120/77 | HR 65 | Resp 16 | Ht 73.0 in | Wt 158.0 lb

## 2021-10-25 DIAGNOSIS — F1021 Alcohol dependence, in remission: Secondary | ICD-10-CM

## 2021-10-25 DIAGNOSIS — F1024 Alcohol dependence with alcohol-induced mood disorder: Secondary | ICD-10-CM

## 2021-10-25 DIAGNOSIS — Z0000 Encounter for general adult medical examination without abnormal findings: Secondary | ICD-10-CM

## 2021-10-25 DIAGNOSIS — B20 Human immunodeficiency virus [HIV] disease: Secondary | ICD-10-CM

## 2021-10-25 DIAGNOSIS — G4700 Insomnia, unspecified: Secondary | ICD-10-CM

## 2021-10-25 MED ORDER — AMITRIPTYLINE HCL 10 MG PO TABS: 10.0000 mg | ORAL_TABLET | Freq: Every day | ORAL | 5 refills | Status: DC

## 2021-10-25 MED ORDER — TRIUMEQ 600-50-300 MG PO TABS: 1.0000 | ORAL_TABLET | Freq: Every day | ORAL | 5 refills | Status: DC

## 2021-10-25 NOTE — Progress Notes (Unsigned)
Therapist met with client as scheduled and discussed their progress. Therapist used active listening skills to provide client with opportunity to disclose previous challenges and successes since last session. Specific problem-solving skills were processed with client, including breaking down problems, brainstorming, evaluating, and choosing options. At times implementing a plan and evaluating and reevaluating results. Therapist assessed for SI/HI during session and will follow-up with client during the next session.  Client met with therapist as scheduled and presented alert; orient X4. At the start of session, client affect appeared euthymic; affect appeared to be congruent with client report. Client appeared to make connections between consequences, challenges and alternative thoughts of mental health recovery during this session. Client expressed their needs in the development of their treatment goals and described different communities of which can serve as an added support. Client expressed concerns/problems in the following areas of their life regarding his recent successes. Client reported that since his last session he had enrolled and completed his [employer] certificate and his [employer] Certification class. Client also moved out of the transition home and got his own place. Client progress toward therapeutic goal(s) remain minimal at this time. Client denied SI/HI.

## 2021-10-25 NOTE — Assessment & Plan Note (Signed)
   Discussed importance of safe sexual practice and condom use. Condoms offered.   Routine dental care up to date.  Routine vaccinations up to date.

## 2021-10-25 NOTE — Patient Instructions (Addendum)
Nice to see you.  We will check your lab work today.  Continue to take your medication daily as prescribed.  Refills have been sent to the pharmacy.  Plan for follow up in 5 months or sooner if needed with lab work on the same day.  Have a great day and stay safe!  

## 2021-10-25 NOTE — Assessment & Plan Note (Deleted)
In remission for 17 months now and doing well. 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

## 2021-10-25 NOTE — Progress Notes (Signed)
Brief Narrative   Patient ID: Bartow Zylstra, male    DOB: 22-Nov-1960, 61 y.o.   MRN: [id number]  Mr. Colello is a 61 y/o AA male diagnosed with HIV disease with risk factor of MSM. Initial viral load and CD4 count are unavailable. Most recent genotype from March 2020 with no medication resistance mutations. [id number] negative. History of alcoholism effecting his medication adherence.   Subjective:    Chief Complaint  Patient presents with   Follow-up    HPI:  Delson Dulworth is a 61 y.o. male with HIV disease last seen on 06/28/21 with well controlled virus and good adherence and tolerance to Triumeq. Viral load was undetectable and CD4 count 796. Kidney function, liver function and electrolytes within normal ranges. Here today for routine follow up.   Mr. Rodkey continues to take his Triumeq as prescribed with no adverse side effects. Feeling well today with no new concerns/complaints. 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 Denies fevers, chills, night sweats, headaches, changes in vision, neck pain/stiffness, nausea, diarrhea, vomiting, lesions or rashes. No problems obtaining medication from the pharmacy. No feelings of being down, depressed or hopeless. Working full time and has his own place now.    Allergies  Allergen Reactions   Penicillins Itching    Has patient had a PCN reaction causing immediate rash, facial/tongue/throat swelling, SOB or lightheadedness with hypotension: No Has patient had a PCN reaction causing severe rash involving mucus membranes or skin necrosis: No Has patient had a PCN reaction that required hospitalization No Has patient had a PCN reaction occurring within the last 10 years: No If all of the above answers are "NO", then may proceed with Cephalosporin use.       Outpatient Medications Prior to Visit  Medication Sig Dispense Refill   abacavir-dolutegravir-lamiVUDine (TRIUMEQ) 600-50-300 MG tablet Take 1  tablet by mouth daily. 30 tablet 5   amitriptyline (ELAVIL) 10 MG tablet Take 1 tablet (10 mg total) by mouth at bedtime. 30 tablet 5   No facility-administered medications prior to visit.     Past Medical History:  Diagnosis Date   Abnormal Pap smear    Alcoholism in remission ([place]) 08/16/2015   Anemia    Atypical chest pain 10/06/2015   Cancer (HCC)    COPD (chronic obstructive pulmonary disease) (HCC)    Depression    Eczema 10/06/2015   HIV (human immunodeficiency virus infection) ([place])    Murmur, heart    Neuromuscular disorder ([place])    Substance abuse ([place])      Past Surgical History:  Procedure Laterality Date   ANOSCOPY        Review of Systems  Constitutional:  Negative for appetite change, chills, fatigue, fever and unexpected weight change.  Eyes:  Negative for visual disturbance.  Respiratory:  Negative for cough, chest tightness, shortness of breath and wheezing.   Cardiovascular:  Negative for chest pain and leg swelling.  Gastrointestinal:  Negative for abdominal pain, constipation, diarrhea, nausea and vomiting.  Genitourinary:  Negative for dysuria, flank pain, frequency, genital sores, hematuria and urgency.  Skin:  Negative for rash.  Allergic/Immunologic: Negative for immunocompromised state.  Neurological:  Negative for dizziness and headaches.      Objective:    BP 120/77   Pulse 65   Resp 16   Ht '6\' 1"'$  (1.854 m)   Wt 158 lb (71.7 kg)   SpO2 (!) 5%   BMI 20.85 kg/m  Nursing note and vital signs reviewed.  Physical Exam Constitutional:      General: He is not in acute distress.    Appearance: He is well-developed.  Eyes:     Conjunctiva/sclera: Conjunctivae normal.  Cardiovascular:     Rate and Rhythm: Normal rate and regular rhythm.     Heart sounds: Normal heart sounds. No murmur heard.    No friction rub. No gallop.  Pulmonary:     Effort: Pulmonary effort is normal. No respiratory distress.     Breath sounds: Normal breath  sounds. No wheezing or rales.  Chest:     Chest wall: No tenderness.  Abdominal:     General: Bowel sounds are normal.     Palpations: Abdomen is soft.     Tenderness: There is no abdominal tenderness.  Musculoskeletal:     Cervical back: Neck supple.  Lymphadenopathy:     Cervical: No cervical adenopathy.  Skin:    General: Skin is warm and dry.     Findings: No rash.  Neurological:     Mental Status: He is alert and oriented to person, place, and time.  Psychiatric:        Behavior: Behavior normal.        Thought Content: Thought content normal.        Judgment: Judgment normal.         10/25/2021    1:49 PM 06/28/2021    2:57 PM 02/15/2021    3:09 PM 01/24/2021    3:30 PM 07/08/2020    4:41 PM  Depression screen PHQ 2/9  Decreased Interest 0 0 0 0 0  Down, Depressed, Hopeless 0 1 0 0 0  PHQ - 2 Score 0 1 0 0 0       Assessment & Plan:    Patient Active Problem List   Diagnosis Date Noted   Infection of index finger, right 01/19/2021   Stenosing tenosynovitis of finger 01/17/2021   Viral gastroenteritis 09/16/2018   Healthcare maintenance 07/02/2018   Infection of lip 07/28/2016   Infected lip laceration 07/28/2016   Alcohol dependence with acute alcoholic intoxication ([place]) 07/25/2016   Major depressive disorder, recurrent severe without psychotic features ([place]) 07/25/2016   MDD (major depressive disorder) 07/25/2016   Atypical chest pain 10/06/2015   Eczema 10/06/2015   Alcoholism in remission ([place]) 08/16/2015   HIV disease ([place]) 06/04/2014   HZV (herpes zoster virus) post herpetic neuralgia 06/04/2014   Rash and nonspecific skin eruption 05/11/2014   Rib pain on left side 10/15/2013   Abnormal Pap smear    ASCUS (atypical squamous cells of undetermined significance) on Pap smear 07/24/2012   Insomnia 12/20/2011   Anemia 06/21/2011   Gynecomastia 05/31/2011   Zoster ophthalmicus 05/31/2011   Headache(784.0) 02/15/2011   Neuropathic pain 11/02/2010    Cyst on ear 11/02/2010   ACUTE GASTRITIS WITHOUT MENTION OF HEMORRHAGE 12/29/2009   CONSTIPATION 12/29/2009   Alcohol dependence with alcohol-induced mood disorder ([place]) 06/07/2009   HERPES ZOSTER 12/07/2008   NEURALGIA, TRIGEMINAL 12/07/2008   ANEMIA, MACROCYTIC, CHRONIC 11/23/2008   POLYURIA 11/23/2008   NOCTURIA 11/23/2008   GYNECOMASTIA 09/21/2008   CARBUNCLE AND FURUNCLE OF UNSPECIFIED SITE 06/19/2008   Leukocytopenia 03/16/2008   ROSACEA 07/22/2007   EDEMA 07/22/2007   PAROXYSMAL NOCTURNAL DYSPNEA 04/01/2007   Pruritic disorder 01/24/2007   DIARRHEA 01/24/2007   Major depressive disorder with current active episode 12/10/2006   Human immunodeficiency virus (HIV) disease ([place]) 01/13/2006     Problem List Items  Addressed This Visit       Nervous and Auditory   Alcohol dependence with alcohol-induced mood disorder (HCC)     Other   Insomnia    Take amitriptyline to help with sleep and sleeping well. Feeling well rested in the mornings. Continue current dose of amitriptyline.       HIV disease [facility]) - Primary    Mr. Glidden continues to have well controlled virus with good adherence and tolerance to Triumeq. Reviewed previous lab work and discussed plan of care. Check lab work today. Continue current dose of Triumeq. Plan for follow up in 5 months or sooner if needed with lab work on the same day.      Relevant Medications   abacavir-dolutegravir-lamiVUDine (TRIUMEQ) 600-50-300 MG tablet   Other Relevant Orders   Comprehensive metabolic panel   HIV-1 RNA quant-no reflex-bld   T-helper cell (CD4)- ([facility] clinic only)   Alcoholism in remission ([place])    In remission for 17 months now and doing well. 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        Healthcare maintenance    Discussed importance of safe sexual practice and condom use. Condoms offered.  Routine dental care up to date. Routine vaccinations up to date.          I am having Flonnie Hailstone maintain his amitriptyline and Triumeq.   Meds ordered this encounter  Medications   amitriptyline (ELAVIL) 10 MG tablet    Sig: Take 1 tablet (10 mg total) by mouth at bedtime.    Dispense:  30 tablet    Refill:  5    Order Specific Question:   Supervising Provider    Answer:   Baxter Flattery, CYNTHIA [[id number]]   abacavir-dolutegravir-lamiVUDine (TRIUMEQ) 824-23-536 MG tablet    Sig: Take 1 tablet by mouth daily.    Dispense:  30 tablet    Refill:  5    Order Specific Question:   Supervising Provider    Answer:   Carlyle Basques [[id number]]     Follow-up: Return in about 5 months (around 03/27/2022), or if symptoms worsen or fail to improve.   Terri Piedra, MSN, FNP-C Nurse Practitioner [facility] [place] number: [phone]

## 2021-10-25 NOTE — Assessment & Plan Note (Signed)
Take amitriptyline to help with sleep and sleeping well. Feeling well rested in the mornings. Continue current dose of amitriptyline.

## 2021-10-25 NOTE — Assessment & Plan Note (Signed)
Arthur Lynch continues to have well controlled virus with good adherence and tolerance to Triumeq. Reviewed previous lab work and discussed plan of care. Check lab work today. Continue current dose of Triumeq. Plan for follow up in 5 months or sooner if needed with lab work on the same day.

## 2021-10-25 NOTE — Assessment & Plan Note (Signed)
In remission for 17 months now and doing well. 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

## 2021-10-26 LAB — T-HELPER CELL (CD4) - (RCID CLINIC ONLY)
CD4 % Helper T Cell: 37 % (ref 33–65)
CD4 T Cell Abs: 762 /uL (ref 400–1790)

## 2021-10-28 LAB — COMPREHENSIVE METABOLIC PANEL
AG Ratio: 1.2 (calc) (ref 1.0–2.5)
ALT: 12 U/L (ref 9–46)
AST: 17 U/L (ref 10–35)
Albumin: 4.4 g/dL (ref 3.6–5.1)
Alkaline phosphatase (APISO): 71 U/L (ref 35–144)
BUN: 11 mg/dL (ref 7–25)
CO2: 27 mmol/L (ref 20–32)
Calcium: 9.6 mg/dL (ref 8.6–10.3)
Chloride: 105 mmol/L (ref 98–110)
Creat: 1.11 mg/dL (ref 0.70–1.30)
Globulin: 3.8 g/dL (calc) — ABNORMAL HIGH (ref 1.9–3.7)
Glucose, Bld: 76 mg/dL (ref 65–99)
Potassium: 4.2 mmol/L (ref 3.5–5.3)
Sodium: 142 mmol/L (ref 135–146)
Total Bilirubin: 0.4 mg/dL (ref 0.2–1.2)
Total Protein: 8.2 g/dL — ABNORMAL HIGH (ref 6.1–8.1)

## 2021-10-28 LAB — HIV-1 RNA QUANT-NO REFLEX-BLD
HIV 1 RNA Quant: NOT DETECTED Copies/mL
HIV-1 RNA Quant, Log: NOT DETECTED Log cps/mL

## 2021-12-06 ENCOUNTER — Other Ambulatory Visit: Payer: Self-pay

## 2021-12-06 ENCOUNTER — Ambulatory Visit (INDEPENDENT_AMBULATORY_CARE_PROVIDER_SITE_OTHER): Payer: Self-pay

## 2021-12-06 DIAGNOSIS — Z23 Encounter for immunization: Secondary | ICD-10-CM

## 2021-12-07 NOTE — Progress Notes (Signed)
I have reviewed and agree with influenza vaccination.

## 2022-02-16 IMAGING — DX DG FINGER INDEX 2+V*R*
3 series · 3 of 3 positions shown · non-contrast
Comparison: None.

CLINICAL DATA: Finger injury

EXAM:
RIGHT INDEX FINGER 2+V

[finger ap]
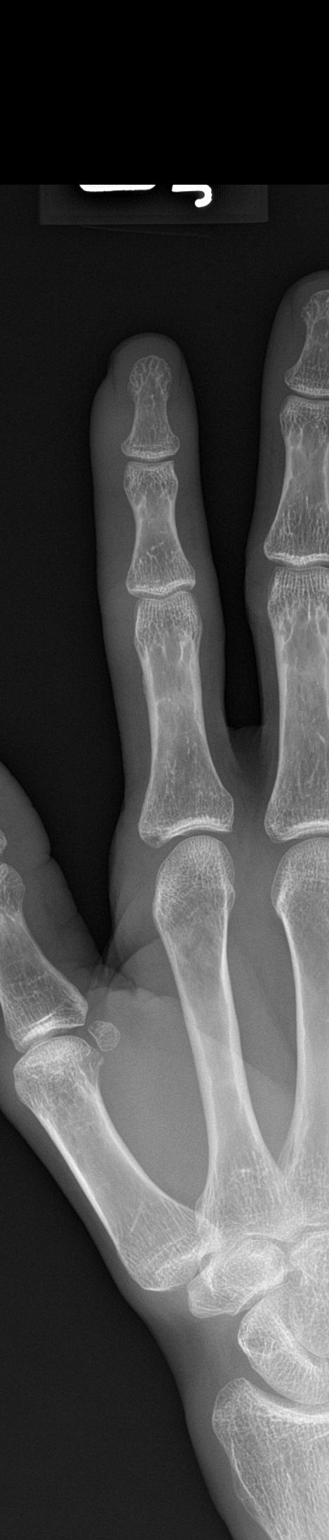

[finger obl]
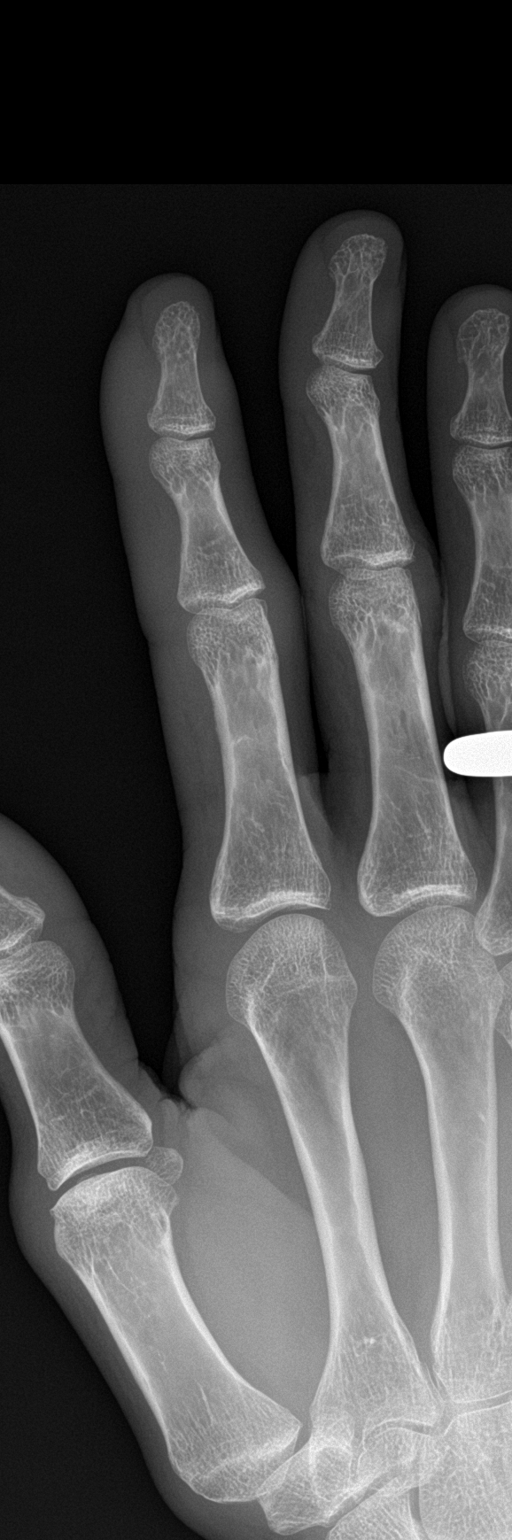

[finger lat]
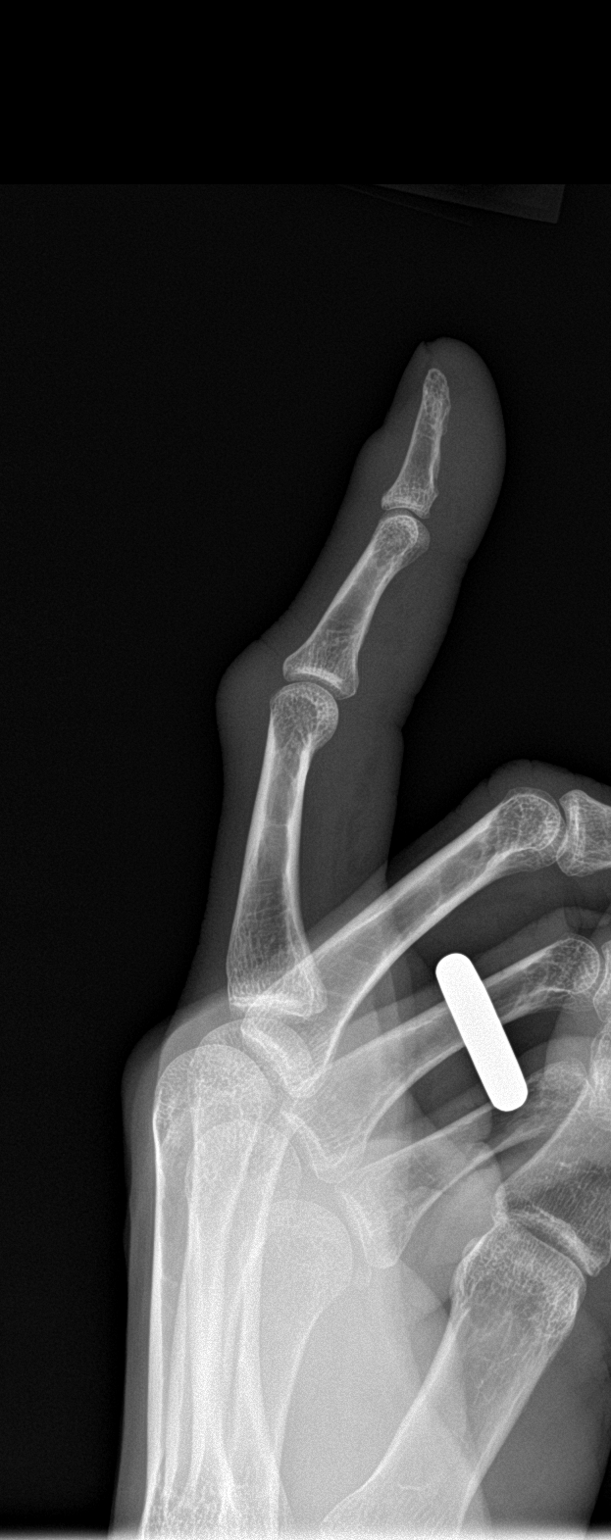

[3 of 3 positions shown; findings below may reference images not displayed]

FINDINGS: No acute fracture or dislocation identified. Soft tissue swelling of
the mid to distal finger with no radiopaque foreign body visualized.
IMPRESSION: Soft tissue swelling.  No acute osseous abnormality identified.

## 2022-03-14 ENCOUNTER — Encounter: Payer: Self-pay | Admitting: Family

## 2022-03-14 ENCOUNTER — Ambulatory Visit: Payer: BC Managed Care – PPO | Admitting: Family

## 2022-03-14 ENCOUNTER — Other Ambulatory Visit (HOSPITAL_COMMUNITY): Payer: Self-pay

## 2022-03-14 ENCOUNTER — Other Ambulatory Visit: Payer: Self-pay

## 2022-03-14 VITALS — BP 96/66 | HR 82 | Temp 97.9°F | Resp 16 | Ht 73.0 in | Wt 154.4 lb

## 2022-03-14 DIAGNOSIS — Z0000 Encounter for general adult medical examination without abnormal findings: Secondary | ICD-10-CM | POA: Diagnosis not present

## 2022-03-14 DIAGNOSIS — Z79899 Other long term (current) drug therapy: Secondary | ICD-10-CM | POA: Diagnosis not present

## 2022-03-14 DIAGNOSIS — B20 Human immunodeficiency virus [HIV] disease: Secondary | ICD-10-CM | POA: Diagnosis not present

## 2022-03-14 DIAGNOSIS — Z113 Encounter for screening for infections with a predominantly sexual mode of transmission: Secondary | ICD-10-CM

## 2022-03-14 DIAGNOSIS — G4700 Insomnia, unspecified: Secondary | ICD-10-CM

## 2022-03-14 MED ORDER — AMITRIPTYLINE HCL 25 MG PO TABS: 25.0000 mg | ORAL_TABLET | Freq: Every day | ORAL | 5 refills | Status: DC

## 2022-03-14 MED ORDER — TRIUMEQ 600-50-300 MG PO TABS: 1.0000 | ORAL_TABLET | Freq: Every day | ORAL | 5 refills | Status: DC

## 2022-03-14 NOTE — Patient Instructions (Addendum)
Nice to see you. ? ?We will check your lab work today. ? ?Continue to take your medication daily as prescribed. ? ?Refills have been sent to the pharmacy. ? ?Plan for follow up in 6 months or sooner if needed with lab work on the same day. ? ?Have a great day and stay safe! ? ?

## 2022-03-14 NOTE — Assessment & Plan Note (Signed)
Discussed importance of safe sexual practice and condom use. Condoms and STD testing offered.  Vaccines up to date.
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
## 2022-03-14 NOTE — Assessment & Plan Note (Signed)
Srihari continues to have well controlled virus with good adherence and tolerance to Triumeq. Reviewed previous lab work and discussed plan of care. Congratulated on his upcoming 2nd anniversary of being sober and reviewed all that he's accomplished. Check lab work today. Continue current dose of Triumeq. Plan for follow up in 6 months or sooner if needed with lab work on the same day.

## 2022-03-14 NOTE — Assessment & Plan Note (Signed)
Sleeping has been waxing and waning with 10 mg of amitriptyline and requesting increase to help improve. Will change to 25 mg nightly and adjust pending treatment effects. Therapeutic monitoring of vital signs and weight with medication change.

## 2022-03-15 LAB — RPR: RPR Ser Ql: NONREACTIVE

## 2022-03-15 LAB — BASIC METABOLIC PANEL WITH GFR
BUN: 19 mg/dL (ref 7–25)
CO2: 30 mmol/L (ref 20–32)
Calcium: 9.8 mg/dL (ref 8.6–10.3)
Chloride: 103 mmol/L (ref 98–110)
Creat: 1.12 mg/dL (ref 0.70–1.30)
Glucose, Bld: 92 mg/dL (ref 65–99)
Potassium: 4.2 mmol/L (ref 3.5–5.3)
Sodium: 139 mmol/L (ref 135–146)
eGFR: 76 mL/min/{1.73_m2} (ref >=60)

## 2022-03-15 LAB — HIV-1 RNA QUANT-NO REFLEX-BLD
HIV 1 RNA Quant: NOT DETECTED Copies/mL
HIV-1 RNA Quant, Log: NOT DETECTED Log cps/mL

## 2022-03-15 LAB — T-HELPER CELL (CD4) - (RCID CLINIC ONLY)
CD4 % Helper T Cell: 41 % (ref 33–65)
CD4 T Cell Abs: 871 /uL (ref 400–1790)

## 2022-03-20 ENCOUNTER — Other Ambulatory Visit (HOSPITAL_COMMUNITY): Payer: Self-pay

## 2022-08-23 NOTE — Progress Notes (Signed)
The 10-year ASCVD risk score (Arnett DK, et al., 2019) is: 4.2%   Values used to calculate the score:     Age: 62 years     Sex: Male     Is Non-Hispanic African American: Yes     Diabetic: No     Tobacco smoker: No     Systolic Blood Pressure: 96 mmHg     Is BP treated: No     HDL Cholesterol: 58 mg/dL     Total Cholesterol: 155 mg/dL  Sandie Ano, RN

## 2022-09-18 ENCOUNTER — Ambulatory Visit: Payer: BC Managed Care – PPO | Admitting: Family

## 2022-10-04 ENCOUNTER — Encounter: Payer: Self-pay | Admitting: Family

## 2022-10-04 ENCOUNTER — Ambulatory Visit: Payer: BC Managed Care – PPO

## 2022-10-04 ENCOUNTER — Ambulatory Visit: Payer: BC Managed Care – PPO | Admitting: Family

## 2022-10-04 ENCOUNTER — Other Ambulatory Visit: Payer: Self-pay

## 2022-10-04 VITALS — BP 99/68 | HR 76 | Resp 16 | Ht 73.0 in | Wt 138.0 lb

## 2022-10-04 DIAGNOSIS — B20 Human immunodeficiency virus [HIV] disease: Secondary | ICD-10-CM

## 2022-10-04 DIAGNOSIS — F1091 Alcohol use, unspecified, in remission: Secondary | ICD-10-CM | POA: Diagnosis not present

## 2022-10-04 DIAGNOSIS — F1021 Alcohol dependence, in remission: Secondary | ICD-10-CM

## 2022-10-04 DIAGNOSIS — G4700 Insomnia, unspecified: Secondary | ICD-10-CM

## 2022-10-04 DIAGNOSIS — Z0000 Encounter for general adult medical examination without abnormal findings: Secondary | ICD-10-CM

## 2022-10-04 MED ORDER — TRIUMEQ 600-50-300 MG PO TABS: 1.0000 | ORAL_TABLET | Freq: Every day | ORAL | 5 refills | Status: DC

## 2022-10-04 MED ORDER — AMITRIPTYLINE HCL 25 MG PO TABS: 25.0000 mg | ORAL_TABLET | Freq: Every day | ORAL | 5 refills | Status: DC

## 2022-10-04 NOTE — Assessment & Plan Note (Signed)
Sleeping well with current dose of amitriptyline and no adverse side effects. Continue current dose of amitriptyline.

## 2022-10-04 NOTE — Assessment & Plan Note (Signed)
Arthur Lynch continues to have well controlled virus with good adherence and tolerance to Triumeq. Reviewed previous lab work and discussed plan of care. Check lab work. Will attempt to connect with [facility] for case management and assistance with housing. Continue current dose of Triumeq. Will be applying for Medicaid and/or [employer] for financial assistance. Plan for follow up in 3 months or sooner if needed with lab work on the same day.

## 2022-10-04 NOTE — Assessment & Plan Note (Signed)
Discussed importance of safe sexual practice and condom use. Condoms and STD testing offered.  Due for Prevnar 20 at next office visit.  Will discuss colonoscopy once insurance is situated.

## 2022-10-04 NOTE — Patient Instructions (Addendum)
Nice to see you.  We will check your lab work today.  Continue to take your medication daily as prescribed.  Refills have been sent to the pharmacy.  Plan for follow up in 3 months or sooner if needed with lab work on the same day.  Have a great day and stay safe!  

## 2022-10-04 NOTE — Assessment & Plan Note (Signed)
Remains in remission and doing well with sobriety despite increased stress and challenging situations. Congratulated on these continued accomplishments. Discussed available resources.
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
## 2022-10-05 LAB — COMPLETE METABOLIC PANEL WITH GFR
AG Ratio: 1.4 (calc) (ref 1.0–2.5)
ALT: 7 U/L — ABNORMAL LOW (ref 9–46)
AST: 17 U/L (ref 10–35)
Albumin: 4.6 g/dL (ref 3.6–5.1)
Alkaline phosphatase (APISO): 63 U/L (ref 35–144)
BUN: 16 mg/dL (ref 7–25)
CO2: 28 mmol/L (ref 20–32)
Creat: 1 mg/dL (ref 0.70–1.30)
Globulin: 3.4 g/dL (calc) (ref 1.9–3.7)
Glucose, Bld: 79 mg/dL (ref 65–99)
Sodium: 141 mmol/L (ref 135–146)
Total Protein: 8 g/dL (ref 6.1–8.1)
eGFR: 87 mL/min/{1.73_m2} (ref >=60)

## 2022-10-05 LAB — T-HELPER CELL (CD4) - (RCID CLINIC ONLY)
CD4 % Helper T Cell: 39 % (ref 33–65)
CD4 T Cell Abs: 698 /uL (ref 400–1790)

## 2022-10-08 LAB — COMPLETE METABOLIC PANEL WITH GFR: Chloride: 107 mmol/L (ref 98–110)

## 2022-10-17 NOTE — Progress Notes (Signed)
The 10-year ASCVD risk score (Arnett DK, et al., 2019) is: 4.5%   Values used to calculate the score:     Age: 62 years     Sex: Male     Is Non-Hispanic African American: Yes     Diabetic: No     Tobacco smoker: No     Systolic Blood Pressure: 99 mmHg     Is BP treated: No     HDL Cholesterol: 58 mg/dL     Total Cholesterol: 155 mg/dL  Sandie Ano, RN

## 2022-12-19 ENCOUNTER — Ambulatory Visit: Payer: MEDICAID | Admitting: Family

## 2022-12-20 ENCOUNTER — Other Ambulatory Visit (HOSPITAL_COMMUNITY): Payer: Self-pay

## 2022-12-20 ENCOUNTER — Other Ambulatory Visit: Payer: Self-pay

## 2022-12-20 ENCOUNTER — Ambulatory Visit (INDEPENDENT_AMBULATORY_CARE_PROVIDER_SITE_OTHER): Payer: MEDICAID | Admitting: Family

## 2022-12-20 ENCOUNTER — Encounter: Payer: Self-pay | Admitting: Family

## 2022-12-20 VITALS — BP 105/71 | HR 78 | Temp 97.4°F | Ht 73.0 in | Wt 140.0 lb

## 2022-12-20 DIAGNOSIS — Z9189 Other specified personal risk factors, not elsewhere classified: Secondary | ICD-10-CM

## 2022-12-20 DIAGNOSIS — Z0000 Encounter for general adult medical examination without abnormal findings: Secondary | ICD-10-CM

## 2022-12-20 DIAGNOSIS — Z23 Encounter for immunization: Secondary | ICD-10-CM | POA: Diagnosis not present

## 2022-12-20 DIAGNOSIS — Z79899 Other long term (current) drug therapy: Secondary | ICD-10-CM | POA: Diagnosis not present

## 2022-12-20 DIAGNOSIS — B20 Human immunodeficiency virus [HIV] disease: Secondary | ICD-10-CM | POA: Diagnosis not present

## 2022-12-20 DIAGNOSIS — G4700 Insomnia, unspecified: Secondary | ICD-10-CM | POA: Diagnosis not present

## 2022-12-20 DIAGNOSIS — F1021 Alcohol dependence, in remission: Secondary | ICD-10-CM

## 2022-12-20 MED ORDER — AMITRIPTYLINE HCL 25 MG PO TABS
25.0000 mg | ORAL_TABLET | Freq: Every day | ORAL | 5 refills | Status: DC
  Filled 2022-12-20: qty 30, 30d supply, fill #0

## 2022-12-20 MED ORDER — TRIUMEQ 600-50-300 MG PO TABS
1.0000 | ORAL_TABLET | Freq: Every day | ORAL | 5 refills | Status: DC
  Filled 2022-12-20: qty 30, 30d supply, fill #0
  Filled 2023-01-10: qty 30, 30d supply, fill #1
  Filled 2023-02-12: qty 30, 30d supply, fill #2
  Filled 2023-03-15: qty 30, 30d supply, fill #3
  Filled 2023-04-11: qty 30, 30d supply, fill #4

## 2022-12-20 NOTE — Progress Notes (Signed)
Brief Narrative   Patient ID: Arthur Lynch, male    DOB: 1961/02/21, 62 y.o.   MRN: [id number]  Arthur Lynch is a 62 y/o AA male diagnosed with HIV disease with risk factor of MSM. Initial viral load and CD4 count are unavailable. Most recent genotype from March 2020 with no medication resistance mutations. [id number] negative. History of alcoholism effecting his medication adherence and now in remission.   Subjective:    Chief Complaint  Patient presents with   Follow-up   HIV Positive/AIDS    HPI:  Arthur Lynch is a 62 y.o. male with HIV disease last seen on 10/04/22 with well controlled virus with good adherence and tolerance to Triumeq. Viral load was undetectable and CD4 count 698. Kidney function, liver function and electrolytes within normal ranges. Here today for follow up.  Arthur Lynch has been doing well since his last office visit and is working on getting a job at [employer]. Continues to take Triumeq as prescribed with no adverse side effects or problems obtaining medication from the pharmacy. Working through some life stressors and continuing to counsel. Due for colonoscopy for colon cancer screening. Condoms and STD testing offered. In contact with the bridge counselor.   Denies fevers, chills, night sweats, headaches, changes in vision, neck pain/stiffness, nausea, diarrhea, vomiting, lesions or rashes.  Lab Results  Component Value Date   CD4TCELL 39 10/04/2022   CD4TABS 698 10/04/2022   Lab Results  Component Value Date   HIV1RNAQUANT Not Detected 10/04/2022     Allergies  Allergen Reactions   Penicillins Itching    Has patient had a PCN reaction causing immediate rash, facial/tongue/throat swelling, SOB or lightheadedness with hypotension: No Has patient had a PCN reaction causing severe rash involving mucus membranes or skin necrosis: No Has patient had a PCN reaction that required hospitalization No Has patient had a PCN reaction occurring within the  last 10 years: No If all of the above answers are "NO", then may proceed with Cephalosporin use.       Outpatient Medications Prior to Visit  Medication Sig Dispense Refill   abacavir-dolutegravir-lamiVUDine (TRIUMEQ) 600-50-300 MG tablet Take 1 tablet by mouth daily. 30 tablet 5   amitriptyline (ELAVIL) 25 MG tablet Take 1 tablet (25 mg total) by mouth at bedtime. 30 tablet 5   No facility-administered medications prior to visit.     Past Medical History:  Diagnosis Date   Abnormal Pap smear    Alcoholism in remission (HCC) 08/16/2015   Anemia    Atypical chest pain 10/06/2015   Cancer (HCC)    COPD (chronic obstructive pulmonary disease) (HCC)    Depression    Eczema 10/06/2015   HIV (human immunodeficiency virus infection) (HCC)    Murmur, heart    Neuromuscular disorder (HCC)    Substance abuse (HCC)      Past Surgical History:  Procedure Laterality Date   ANOSCOPY        Review of Systems  Constitutional:  Negative for appetite change, chills, fatigue, fever and unexpected weight change.  Eyes:  Negative for visual disturbance.  Respiratory:  Negative for cough, chest tightness, shortness of breath and wheezing.   Cardiovascular:  Negative for chest pain and leg swelling.  Gastrointestinal:  Negative for abdominal pain, constipation, diarrhea, nausea and vomiting.  Genitourinary:  Negative for dysuria, flank pain, frequency, genital sores, hematuria and urgency.  Skin:  Negative for rash.  Allergic/Immunologic: Negative for immunocompromised state.  Neurological:  Negative for  dizziness and headaches.      Objective:    BP 105/71   Pulse 78   Temp (!) 97.4 F (36.3 C) (Temporal)   Ht 6\' 1"  (1.854 m)   Wt 140 lb (63.5 kg)   SpO2 98%   BMI 18.47 kg/m  Nursing note and vital signs reviewed.  Physical Exam Constitutional:      General: He is not in acute distress.    Appearance: He is well-developed.  Eyes:     Conjunctiva/sclera: Conjunctivae  normal.  Cardiovascular:     Rate and Rhythm: Normal rate and regular rhythm.     Heart sounds: Normal heart sounds. No murmur heard.    No friction rub. No gallop.  Pulmonary:     Effort: Pulmonary effort is normal. No respiratory distress.     Breath sounds: Normal breath sounds. No wheezing or rales.  Chest:     Chest wall: No tenderness.  Abdominal:     General: Bowel sounds are normal.     Palpations: Abdomen is soft.     Tenderness: There is no abdominal tenderness.  Musculoskeletal:     Cervical back: Neck supple.  Lymphadenopathy:     Cervical: No cervical adenopathy.  Skin:    General: Skin is warm and dry.     Findings: No rash.  Neurological:     Mental Status: He is alert and oriented to person, place, and time.  Psychiatric:        Behavior: Behavior normal.        Thought Content: Thought content normal.        Judgment: Judgment normal.         12/20/2022    2:16 PM 10/04/2022   11:01 AM 03/14/2022    3:55 PM 10/25/2021    1:49 PM 06/28/2021    2:57 PM  Depression screen PHQ 2/9  Decreased Interest 0 0 0 0 0  Down, Depressed, Hopeless 0 1 0 0 1  PHQ - 2 Score 0 1 0 0 1  Altered sleeping  3     Tired, decreased energy  2     Change in appetite  3     Feeling bad or failure about yourself   0     Trouble concentrating  0     Moving slowly or fidgety/restless  0     Suicidal thoughts  0     PHQ-9 Score  9          Assessment & Plan:    Patient Active Problem List   Diagnosis Date Noted   Infection of index finger, right 01/19/2021   Stenosing tenosynovitis of finger 01/17/2021   Viral gastroenteritis 09/16/2018   Healthcare maintenance 07/02/2018   Infection of lip 07/28/2016   Infected lip laceration 07/28/2016   Alcohol dependence with acute alcoholic intoxication (HCC) 07/25/2016   Major depressive disorder, recurrent severe without psychotic features (HCC) 07/25/2016   MDD (major depressive disorder) 07/25/2016   Atypical chest pain  10/06/2015   Eczema 10/06/2015   Alcoholism in remission (HCC) 08/16/2015   HIV disease (HCC) 06/04/2014   HZV (herpes zoster virus) post herpetic neuralgia 06/04/2014   Rash and nonspecific skin eruption 05/11/2014   Rib pain on left side 10/15/2013   Abnormal Pap smear    ASCUS (atypical squamous cells of undetermined significance) on Pap smear 07/24/2012   Insomnia 12/20/2011   Anemia 06/21/2011   Gynecomastia 05/31/2011   Zoster ophthalmicus 05/31/2011   Headache 02/15/2011  Neuropathic pain 11/02/2010   Cyst on ear 11/02/2010   Acute gastritis 12/29/2009   Constipation 12/29/2009   Alcohol dependence with alcohol-induced mood disorder (HCC) 06/07/2009   Herpes zoster 12/07/2008   NEURALGIA, TRIGEMINAL 12/07/2008   Deficiency anemia 11/23/2008   POLYURIA 11/23/2008   NOCTURIA 11/23/2008   GYNECOMASTIA 09/21/2008   Carbuncle and furuncle 06/19/2008   Leukocytopenia 03/16/2008   ROSACEA 07/22/2007   EDEMA 07/22/2007   PAROXYSMAL NOCTURNAL DYSPNEA 04/01/2007   Pruritic disorder 01/24/2007   DIARRHEA 01/24/2007   Major depressive disorder with current active episode 12/10/2006   Human immunodeficiency virus (HIV) disease (HCC) 01/13/2006     Problem List Items Addressed This Visit       Other   Insomnia    Doing well and sleep adequately with current dose of amitriptyline with no adverse side effects. Continue current dose of amitriptyline.       Relevant Medications   amitriptyline (ELAVIL) 25 MG tablet   HIV disease (HCC) - Primary    Arthur Lynch continues to have well controlled virus with good adherence and tolerance to Triuqmeq.  Reviewed lab work and discussed plan of care, U equals U, and family planning. Check lab work. Continue current dose of Triumeq. Plan for follow up in  3 months or sooner if needed with lab work on the same day.       Relevant Medications   abacavir-dolutegravir-lamiVUDine (TRIUMEQ) 600-50-300 MG tablet   Alcoholism in remission (HCC)     Continues to remain in remission and coming up on 3 years. Congratulated on these accomplishments. Continues to counsel others in recovery.       Healthcare maintenance    Discussed importance of safe sexual practice and condom use. Condoms and STD testing offered.  Influenza and Covid vaccinations up to date.  Referral sent for colonoscopy.  Declines anal pap.       Relevant Orders   COMPLETE METABOLIC PANEL WITH GFR   HIV-1 RNA quant-no reflex-bld   T-helper cell (CD4)- ([facility] clinic only)   Other Visit Diagnoses     Pharmacologic therapy       Relevant Orders   Lipid panel   At risk for colon cancer       Relevant Orders   Amb Referral to Colonoscopy   Encounter for immunization       Relevant Orders   Flu vaccine trivalent PF, 6mos and older(Flulaval,Afluria,Fluarix,Fluzone) (Completed)   Pfizer Comirnaty Covid-19 Vaccine 65yrs & older (Completed)        I am having Arthur Lynch maintain his Triumeq and amitriptyline.   Meds ordered this encounter  Medications   abacavir-dolutegravir-lamiVUDine (TRIUMEQ) 600-50-300 MG tablet    Sig: Take 1 tablet by mouth daily.    Dispense:  30 tablet    Refill:  5    Please mail.    Order Specific Question:   Supervising Provider    Answer:   Judyann Munson [[id number]]   amitriptyline (ELAVIL) 25 MG tablet    Sig: Take 1 tablet (25 mg total) by mouth at bedtime.    Dispense:  30 tablet    Refill:  5    Please mail.    Order Specific Question:   Supervising Provider    Answer:   Judyann Munson [[id number]]     Follow-up: Return in about 3 months (around 03/22/2023), or if symptoms worsen or fail to improve. or sooner if needed.    Marcos Eke, MSN, FNP-C Nurse Practitioner [facility] for Infectious  Disease [facility] Main number: [phone]

## 2022-12-20 NOTE — Patient Instructions (Addendum)
Nice to see you.  We will check your lab work today.  Continue to take your medication daily as prescribed.  Refills have been sent to the pharmacy.  Plan for follow up in 3 months or sooner if needed with lab work on the same day.  Have a great day and stay safe!  [facility] [phone] Crisis Line [phone]

## 2022-12-20 NOTE — Assessment & Plan Note (Signed)
Doing well and sleep adequately with current dose of amitriptyline with no adverse side effects. Continue current dose of amitriptyline.

## 2022-12-20 NOTE — Assessment & Plan Note (Signed)
Continues to remain in remission and coming up on 3 years. Congratulated on these accomplishments. Continues to counsel others in recovery.

## 2022-12-20 NOTE — Assessment & Plan Note (Signed)
Discussed importance of safe sexual practice and condom use. Condoms and STD testing offered.  Influenza and Covid vaccinations up to date.  Referral sent for colonoscopy.  Declines anal pap.

## 2022-12-20 NOTE — Progress Notes (Signed)
Specialty Pharmacy Initial Fill Coordination Note  Arthur Lynch is a 62 y.o. male contacted today regarding refills of specialty medication(s) Abacavir-Dolutegravir-Lamivud   Patient requested Delivery   Delivery date: 12/22/22   Verified address: [address] [place] [address]   Medication will be filled on 12/21/22.   Patient is aware of $0 copayment.

## 2022-12-20 NOTE — Assessment & Plan Note (Signed)
Arthur Lynch continues to have well controlled virus with good adherence and tolerance to Triuqmeq.  Reviewed lab work and discussed plan of care, U equals U, and family planning. Check lab work. Continue current dose of Triumeq. Plan for follow up in  3 months or sooner if needed with lab work on the same day.

## 2022-12-21 ENCOUNTER — Other Ambulatory Visit: Payer: Self-pay | Admitting: Pharmacist

## 2022-12-21 LAB — T-HELPER CELL (CD4) - (RCID CLINIC ONLY)
CD4 % Helper T Cell: 47 % (ref 33–65)
CD4 T Cell Abs: 773 /uL (ref 400–1790)

## 2022-12-21 NOTE — Progress Notes (Signed)
Specialty Pharmacy Initiation Note   Arthur Lynch is a 61 y.o. male who will be followed by the specialty pharmacy service for RxSp HIV    Review of administration, indication, effectiveness, safety, potential side effects, storage/disposable, and missed dose instructions occurred today for patient's specialty medication(s) Abacavir-Dolutegravir-Lamivud     Patient/Caregiver did not have any additional questions or concerns.   Patient's therapy is appropriate to: Continue    Goals Addressed             This Visit's Progress    Achieve Undetectable HIV Viral Load < 20       Patient is on track. Patient will maintain adherence      Increase CD4 count until steady state       Patient is on track. Patient will maintain adherence      Maintain optimal adherence to therapy       Patient is on track. Patient will maintain adherence         Jennette Kettle Specialty Pharmacist

## 2022-12-22 LAB — COMPLETE METABOLIC PANEL WITH GFR
AG Ratio: 1.4 (calc) (ref 1.0–2.5)
ALT: 11 U/L (ref 9–46)
AST: 18 U/L (ref 10–35)
Albumin: 4.2 g/dL (ref 3.6–5.1)
Alkaline phosphatase (APISO): 63 U/L (ref 35–144)
BUN: 13 mg/dL (ref 7–25)
CO2: 29 mmol/L (ref 20–32)
Calcium: 9.4 mg/dL (ref 8.6–10.3)
Chloride: 107 mmol/L (ref 98–110)
Creat: 0.97 mg/dL (ref 0.70–1.35)
Globulin: 3 g/dL (ref 1.9–3.7)
Glucose, Bld: 79 mg/dL (ref 65–99)
Potassium: 4 mmol/L (ref 3.5–5.3)
Sodium: 141 mmol/L (ref 135–146)
Total Bilirubin: 0.6 mg/dL (ref 0.2–1.2)
Total Protein: 7.2 g/dL (ref 6.1–8.1)
eGFR: 89 mL/min/{1.73_m2} (ref >=60)

## 2022-12-22 LAB — LIPID PANEL
Cholesterol: 190 mg/dL (ref <200)
HDL: 60 mg/dL (ref >=40)
LDL Cholesterol (Calc): 110 mg/dL — ABNORMAL HIGH
Non-HDL Cholesterol (Calc): 130 mg/dL — ABNORMAL HIGH (ref <130)
Total CHOL/HDL Ratio: 3.2 (calc) (ref <5.0)
Triglycerides: 94 mg/dL (ref <150)

## 2022-12-22 LAB — HIV-1 RNA QUANT-NO REFLEX-BLD
HIV 1 RNA Quant: NOT DETECTED {copies}/mL
HIV-1 RNA Quant, Log: NOT DETECTED {Log}

## 2022-12-29 ENCOUNTER — Other Ambulatory Visit (HOSPITAL_COMMUNITY): Payer: Self-pay

## 2023-01-10 ENCOUNTER — Other Ambulatory Visit: Payer: Self-pay

## 2023-01-10 NOTE — Progress Notes (Signed)
Specialty Pharmacy Refill Coordination Note  Arthur Lynch is a 62 y.o. male contacted today regarding refills of specialty medication(s) Abacavir-Dolutegravir-Lamivud   Patient requested Delivery   Delivery date: 01/16/23   Verified address: [address] [place] [address]   Medication will be filled on 01/15/23.

## 2023-01-25 ENCOUNTER — Ambulatory Visit (AMBULATORY_SURGERY_CENTER): Payer: MEDICAID

## 2023-01-25 VITALS — Ht 73.5 in | Wt 139.0 lb

## 2023-01-25 DIAGNOSIS — Z1211 Encounter for screening for malignant neoplasm of colon: Secondary | ICD-10-CM

## 2023-01-25 NOTE — Progress Notes (Signed)
No egg or soy allergy known to patient  No issues known to pt with past sedation with any surgeries or procedures Patient denies ever being told they had issues or difficulty with intubation  No FH of Malignant Hyperthermia Pt is not on diet pills Pt is not on  home 02  Pt is not on blood thinners  Pt denies issues with constipation  No A fib or A flutter Have any cardiac testing pending--no LOA: independent  Prep: spilt does miralax  Patient's chart reviewed by Cathlyn Parsons CNRA prior to previsit and patient appropriate for the [facility].  Previsit completed and red dot placed by patient's name on their procedure day (on provider's schedule).     PV competed with patient. Prep instructions sent via mychart and hard copy given at [facility] apt.

## 2023-02-05 ENCOUNTER — Other Ambulatory Visit (HOSPITAL_COMMUNITY): Payer: Self-pay

## 2023-02-09 ENCOUNTER — Other Ambulatory Visit: Payer: Self-pay

## 2023-02-12 ENCOUNTER — Other Ambulatory Visit: Payer: Self-pay

## 2023-02-12 NOTE — Progress Notes (Signed)
Specialty Pharmacy Refill Coordination Note  Arthur Lynch is a 62 y.o. male contacted today regarding refills of specialty medication(s) Abacavir-Dolutegravir-Lamivud   Patient requested Delivery   Delivery date: 02/14/23   Verified address: [address] [place] [address]   Medication will be filled on 02/13/23.

## 2023-02-15 ENCOUNTER — Encounter: Payer: Self-pay | Admitting: Gastroenterology

## 2023-02-15 ENCOUNTER — Ambulatory Visit: Payer: MEDICAID | Admitting: Gastroenterology

## 2023-02-15 VITALS — BP 114/74 | HR 63 | Temp 97.9°F | Resp 11 | Ht 73.5 in | Wt 139.0 lb

## 2023-02-15 DIAGNOSIS — Z1211 Encounter for screening for malignant neoplasm of colon: Secondary | ICD-10-CM

## 2023-02-15 DIAGNOSIS — K5730 Diverticulosis of large intestine without perforation or abscess without bleeding: Secondary | ICD-10-CM | POA: Diagnosis not present

## 2023-02-15 DIAGNOSIS — Q439 Congenital malformation of intestine, unspecified: Secondary | ICD-10-CM

## 2023-02-15 DIAGNOSIS — K6289 Other specified diseases of anus and rectum: Secondary | ICD-10-CM | POA: Diagnosis not present

## 2023-02-15 MED ORDER — SODIUM CHLORIDE 0.9 % IV SOLN: 500.0000 mL | Freq: Once | INTRAVENOUS | Status: AC

## 2023-02-15 NOTE — Op Note (Signed)
[facility] Patient Name: Arthur Lynch Procedure Date: 02/15/2023 10:15 AM MRN: [id number] Endoscopist: Lorin Picket E. Tomasa Rand , MD, [id number] Age: 62 Referring MD:  Date of Birth: 08/15/1960 Gender: Male Account #: [id number] Procedure:                Colonoscopy Indications:              Screening for colorectal malignant neoplasm, This                            is the patient's first colonoscopy Medicines:                Monitored Anesthesia Care Procedure:                Pre-Anesthesia Assessment:                           - Prior to the procedure, a History and Physical                            was performed, and patient medications and                            allergies were reviewed. The patient's tolerance of                            previous anesthesia was also reviewed. The risks                            and benefits of the procedure and the sedation                            options and risks were discussed with the patient.                            All questions were answered, and informed consent                            was obtained. Prior Anticoagulants: The patient has                            taken no anticoagulant or antiplatelet agents. ASA                            Grade Assessment: III - A patient with severe                            systemic disease. After reviewing the risks and                            benefits, the patient was deemed in satisfactory                            condition to undergo the procedure.  After obtaining informed consent, the colonoscope                            was passed under direct vision. Throughout the                            procedure, the patient's blood pressure, pulse, and                            oxygen saturations were monitored continuously. The                            [id number]-HQ190L Colonoscope [id number] was introduced                            through the anus and  advanced to the the terminal                            ileum, with identification of the appendiceal                            orifice and IC valve. The colonoscopy was somewhat                            difficult due to significant looping and a tortuous                            colon. Successful completion of the procedure was                            aided by using manual pressure. The patient                            tolerated the procedure well. The quality of the                            bowel preparation was adequate. The terminal ileum,                            ileocecal valve, appendiceal orifice, and rectum                            were photographed. The bowel preparation used was                            Miralax via split dose instruction. Scope In: 10:30:50 AM Scope Out: 10:57:02 AM Scope Withdrawal Time: 0 hours 12 minutes 40 seconds  Total Procedure Duration: 0 hours 26 minutes 12 seconds  Findings:                 The perianal examination was normal, although                            sphincter tone was visibly decreased.  The digital rectal exam findings include decreased                            sphincter tone. Pertinent negatives include no                            palpable rectal lesions.                           Many large-mouthed and small-mouthed diverticula                            were found in the sigmoid colon, descending colon,                            transverse colon, ascending colon and cecum. There                            was evidence of an impacted diverticulum.                           The exam was otherwise normal throughout the                            examined colon.                           The terminal ileum appeared normal.                           The retroflexed view of the distal rectum and anal                            verge was normal and showed no anal or rectal                             abnormalities. Complications:            No immediate complications. Estimated Blood Loss:     Estimated blood loss: none. Impression:               - Decreased sphincter tone found on digital rectal                            exam.                           - Moderate diverticulosis in the sigmoid colon, in                            the descending colon, in the transverse colon, in                            the ascending colon and in the cecum. There was  evidence of an impacted diverticulum.                           - The examined portion of the ileum was normal.                           - The distal rectum and anal verge are normal on                            retroflexion view.                           - No specimens collected. Recommendation:           - Patient has a contact number available for                            emergencies. The signs and symptoms of potential                            delayed complications were discussed with the                            patient. Return to normal activities tomorrow.                            Written discharge instructions were provided to the                            patient.                           - Resume previous diet.                           - Continue present medications.                           - Repeat colonoscopy in 10 years for screening                            purposes.                           - Recommend high fiber diet or daily fiber                            supplement to reduce risk of diverticular                            complications. [name] E. Tomasa Rand, MD 02/15/2023 11:09:26 AM This report has been signed electronically.

## 2023-02-15 NOTE — Progress Notes (Signed)
Sedate, gd SR, tolerated procedure well, VSS, report to RN 

## 2023-02-15 NOTE — Progress Notes (Signed)
Pt's states no medical or surgical changes since previsit or office visit. 

## 2023-02-15 NOTE — Progress Notes (Signed)
[facility] History and Physical   Primary Care Physician:  Pcp, No   Reason for Procedure:   Colon cancer screening  Plan:    Screening colonoscopy     HPI: Arthur Lynch is a 62 y.o. male undergoing initial average risk screening colonoscopy.  He has no family history of colon cancer and no chronic GI symptoms.    Past Medical History:  Diagnosis Date   Abnormal Pap smear    Alcoholism in remission (HCC) 08/16/2015   Anemia    Atypical chest pain 10/06/2015   Cancer (HCC)    COPD (chronic obstructive pulmonary disease) (HCC)    Depression    Eczema 10/06/2015   HIV (human immunodeficiency virus infection) (HCC)    Murmur, heart    Neuromuscular disorder (HCC)    Substance abuse (HCC)     Past Surgical History:  Procedure Laterality Date   ANOSCOPY      Prior to Admission medications   Medication Sig Start Date End Date Taking? Authorizing Provider  abacavir-dolutegravir-lamiVUDine (TRIUMEQ) 600-50-300 MG tablet Take 1 tablet by mouth daily. 12/20/22  Yes Veryl Speak, FNP  amitriptyline (ELAVIL) 25 MG tablet Take 1 tablet (25 mg total) by mouth at bedtime. 12/20/22  Yes Veryl Speak, FNP    Current Outpatient Medications  Medication Sig Dispense Refill   abacavir-dolutegravir-lamiVUDine (TRIUMEQ) 600-50-300 MG tablet Take 1 tablet by mouth daily. 30 tablet 5   amitriptyline (ELAVIL) 25 MG tablet Take 1 tablet (25 mg total) by mouth at bedtime. 30 tablet 5   Current Facility-Administered Medications  Medication Dose Route Frequency Provider Last Rate Last Admin   0.9 %  sodium chloride infusion  500 mL Intravenous Once Jenel Lucks, MD        Allergies as of 02/15/2023 - Review Complete 02/15/2023  Allergen Reaction Noted   Penicillins Itching     Family History  Problem Relation Age of Onset   Breast cancer Neg Hx    Colon cancer Neg Hx    Esophageal cancer Neg Hx    Rectal cancer Neg Hx    Stomach cancer Neg Hx      Social History   Socioeconomic History   Marital status: Single    Spouse name: Not on file   Number of children: Not on file   Years of education: Not on file   Highest education level: Not on file  Occupational History   Not on file  Tobacco Use   Smoking status: Never   Smokeless tobacco: Never  Substance and Sexual Activity   Alcohol use: Not Currently    Alcohol/week: 45.0 standard drinks of alcohol    Types: 45 Standard drinks or equivalent per week    Comment: in residential program   Drug use: No   Sexual activity: Never    Partners: Male    Birth control/protection: Condom    Comment: declined condoms  Other Topics Concern   Not on file  Social History Narrative   Not on file   Social Determinants of Health   Financial Resource Strain: Not on file  Food Insecurity: Not on file  Transportation Needs: Not on file  Physical Activity: Not on file  Stress: Not on file  Social Connections: Not on file  Intimate Partner Violence: Not on file    Review of Systems:  All other review of systems negative except as mentioned in the HPI.  Physical Exam: Vital signs BP 92/60   Pulse 81  Temp 97.9 F (36.6 C) (Temporal)   Ht 6' 1.5" (1.867 m)   Wt 139 lb (63 kg)   SpO2 97%   BMI 18.09 kg/m   General:   Alert,  Well-developed, well-nourished, pleasant and cooperative in NAD Airway:  Mallampati 1 Lungs:  Clear throughout to auscultation.   Heart:  Regular rate and rhythm; no murmurs, clicks, rubs,  or gallops. Abdomen:  Soft, nontender and nondistended. Normal bowel sounds.   Neuro/Psych:  Normal mood and affect. A and O x 3   Maisley Hainsworth E. Tomasa Rand, MD [facility]

## 2023-02-15 NOTE — Patient Instructions (Addendum)
Recommendation:  - Patient has a contact number available for emergencies. The signs and symptoms of potential delayed complications were discussed with the patient. Return to normal activities tomorrow. Written discharge instructions were provided to the patient.  - Resume previous diet.  - Continue present medications.  - Repeat colonoscopy in 10 years for screening purposes.  - Recommend high fiber diet or daily fiber supplement to reduce risk of diverticular complications.   YOU HAD AN ENDOSCOPIC PROCEDURE TODAY AT THE [facility]:   Refer to the procedure report that was given to you for any specific questions about what was found during the examination.  If the procedure report does not answer your questions, please call your gastroenterologist to clarify.  If you requested that your care partner not be given the details of your procedure findings, then the procedure report has been included in a sealed envelope for you to review at your convenience later.  YOU SHOULD EXPECT: Some feelings of bloating in the abdomen. Passage of more gas than usual.  Walking can help get rid of the air that was put into your GI tract during the procedure and reduce the bloating. If you had a lower endoscopy (such as a colonoscopy or flexible sigmoidoscopy) you may notice spotting of blood in your stool or on the toilet paper. If you underwent a bowel prep for your procedure, you may not have a normal bowel movement for a few days.  Please Note:  You might notice some irritation and congestion in your nose or some drainage.  This is from the oxygen used during your procedure.  There is no need for concern and it should clear up in a day or so.  SYMPTOMS TO REPORT IMMEDIATELY:  Following lower endoscopy (colonoscopy or flexible sigmoidoscopy):  Excessive amounts of blood in the stool  Significant tenderness or worsening of abdominal pains  Swelling of the abdomen that is new, acute  Fever of 100F  or higher   For urgent or emergent issues, a gastroenterologist can be reached at any hour by calling [phone]. Do not use MyChart messaging for urgent concerns.    DIET:  Follow High Fiber Diet. We do recommend a small meal at first, but then you may proceed to your regular diet.  Drink plenty of fluids but you should avoid alcoholic beverages for 24 hours.  MEDICATIONS: Continue present medications.  FOLLOW UP: Repeat colonoscopy in 10 years for screening purposes.  Please see handouts given to you by  your recovery nurse: Diverticulosis. High Fiber Diet.  Thank you for allowing [place] to provide for your healthcare needs today.  ACTIVITY:  You should plan to take it easy for the rest of today and you should NOT DRIVE or use heavy machinery until tomorrow (because of the sedation medicines used during the test).    FOLLOW UP: Our staff will call the number listed on your records the next business day following your procedure.  We will call around 7:15- 8:00 am to check on you and address any questions or concerns that you may have regarding the information given to you following your procedure. If we do not reach you, we will leave a message.     If any biopsies were taken you will be contacted by phone or by letter within the next 1-3 weeks.  Please call us at [phone] if you have not heard about the biopsies in 3 weeks.    SIGNATURES/CONFIDENTIALITY: You and/or your care partner have  signed paperwork which will be entered into your electronic medical record.  These signatures attest to the fact that that the information above on your After Visit Summary has been reviewed and is understood.  Full responsibility of the confidentiality of this discharge information lies with you and/or your care-partner.

## 2023-02-16 ENCOUNTER — Telehealth: Payer: Self-pay | Admitting: *Deleted

## 2023-02-16 NOTE — Telephone Encounter (Signed)
  Follow up Call-     02/15/2023    9:55 AM  Call back number  Post procedure Call Back phone  # [phone]  Permission to leave phone message Yes     Patient questions:  Do you have a fever, pain , or abdominal swelling? No. Pain Score  0 *  Have you tolerated food without any problems? Yes.    Have you been able to return to your normal activities? Yes.    Do you have any questions about your discharge instructions: Diet   No. Medications  No. Follow up visit  No.  Do you have questions or concerns about your Care? No.  Actions: * If pain score is 4 or above: No action needed, pain <4.

## 2023-03-09 ENCOUNTER — Other Ambulatory Visit: Payer: Self-pay

## 2023-03-12 ENCOUNTER — Other Ambulatory Visit (HOSPITAL_COMMUNITY): Payer: Self-pay

## 2023-03-12 ENCOUNTER — Encounter (HOSPITAL_COMMUNITY): Payer: Self-pay

## 2023-03-15 ENCOUNTER — Other Ambulatory Visit (HOSPITAL_COMMUNITY): Payer: Self-pay | Admitting: Pharmacy Technician

## 2023-03-15 ENCOUNTER — Other Ambulatory Visit (HOSPITAL_COMMUNITY): Payer: Self-pay

## 2023-03-15 NOTE — Progress Notes (Signed)
 Specialty Pharmacy Refill Coordination Note  Arthur Lynch is a 63 y.o. male contacted today regarding refills of specialty medication(s) Abacavir -Dolutegravir -Lamivud (Triumeq )   Patient requested Delivery   Delivery date: 03/20/23   Verified address: [address]  [place]   Medication will be filled on 03/19/23.

## 2023-04-04 ENCOUNTER — Ambulatory Visit: Payer: MEDICAID | Admitting: Family

## 2023-04-11 ENCOUNTER — Other Ambulatory Visit: Payer: Self-pay

## 2023-04-11 NOTE — Progress Notes (Signed)
Specialty Pharmacy Refill Coordination Note  Arthur Lynch is a 64 y.o. male contacted today regarding refills of specialty medication(s) Abacavir-Dolutegravir-Lamivud [name])   Patient requested Delivery   Delivery date: 04/18/23   Verified address: [address] [place]   Medication will be filled on 04/17/23.

## 2023-05-01 ENCOUNTER — Ambulatory Visit: Payer: MEDICAID | Admitting: Family

## 2023-05-04 ENCOUNTER — Other Ambulatory Visit: Payer: Self-pay

## 2023-05-04 ENCOUNTER — Other Ambulatory Visit (HOSPITAL_COMMUNITY): Payer: Self-pay

## 2023-05-04 ENCOUNTER — Ambulatory Visit (INDEPENDENT_AMBULATORY_CARE_PROVIDER_SITE_OTHER): Payer: MEDICAID | Admitting: Family

## 2023-05-04 ENCOUNTER — Encounter: Payer: Self-pay | Admitting: Family

## 2023-05-04 VITALS — BP 94/63 | HR 85 | Ht 73.0 in | Wt 137.0 lb

## 2023-05-04 DIAGNOSIS — Z0000 Encounter for general adult medical examination without abnormal findings: Secondary | ICD-10-CM

## 2023-05-04 DIAGNOSIS — B20 Human immunodeficiency virus [HIV] disease: Secondary | ICD-10-CM

## 2023-05-04 DIAGNOSIS — G4700 Insomnia, unspecified: Secondary | ICD-10-CM

## 2023-05-04 DIAGNOSIS — Z59819 Housing instability, housed unspecified: Secondary | ICD-10-CM

## 2023-05-04 DIAGNOSIS — L309 Dermatitis, unspecified: Secondary | ICD-10-CM

## 2023-05-04 MED ORDER — AMITRIPTYLINE HCL 25 MG PO TABS
25.0000 mg | ORAL_TABLET | Freq: Every day | ORAL | 5 refills | Status: DC
  Filled 2023-05-04 – 2023-05-11 (×2): qty 30, 30d supply, fill #0
  Filled 2023-06-14: qty 30, 30d supply, fill #1
  Filled 2023-09-07: qty 30, 30d supply, fill #2

## 2023-05-04 MED ORDER — TRIAMCINOLONE ACETONIDE 0.1 % EX CREA
1.0000 | TOPICAL_CREAM | Freq: Two times a day (BID) | CUTANEOUS | 2 refills | Status: DC
  Filled 2023-05-04: qty 30, 15d supply, fill #0
  Filled 2023-05-11: qty 30, 30d supply, fill #0

## 2023-05-04 MED ORDER — TRIUMEQ 600-50-300 MG PO TABS
1.0000 | ORAL_TABLET | Freq: Every day | ORAL | 5 refills | Status: DC
  Filled 2023-05-04 – 2023-05-11 (×2): qty 30, 30d supply, fill #0
  Filled 2023-06-14: qty 30, 30d supply, fill #1
  Filled 2023-07-11: qty 30, 30d supply, fill #2
  Filled 2023-08-02: qty 30, 30d supply, fill #3
  Filled 2023-09-05: qty 30, 30d supply, fill #4

## 2023-05-04 NOTE — Assessment & Plan Note (Signed)
Right lower extremity consistent with eczema.  Continue moisturization with Marice Potter, Aveeno, Eucerin products.  Refill triamcinolone cream to use as needed.

## 2023-05-04 NOTE — Assessment & Plan Note (Signed)
Insomnia stable and has been off of amitriptyline recently and requesting refills as he sleeps better with medication.  No adverse side effects.  Sleep hygiene encouraged.  Continue current dose of amitriptyline.

## 2023-05-04 NOTE — Patient Instructions (Addendum)
Nice to see you. ? ?We will check your lab work today. ? ?Continue to take your medication daily as prescribed. ? ?Refills have been sent to the pharmacy. ? ?Please call [facility]) to schedule/follow up on your dental care at [phone] ? ?Plan for follow up in 4 months or sooner if needed with lab work on the same day. ? ?Have a great day and stay safe! ? ?

## 2023-05-04 NOTE — Assessment & Plan Note (Signed)
Discussed importance of safe sexual practice and condom use. Condoms and site specific STD testing offered.  Vaccinations reviewed and declined today. Due for routine dental care with appointment contact information in AVS.  Colon cancer screening up to date.  Anal pap smear due at next visit.

## 2023-05-04 NOTE — Assessment & Plan Note (Signed)
Donoven continues to have well-controlled virus with good adherence and tolerance to Triumeq.  Reviewed previous lab work and discussed plan of care and U equals U.  Check blood work.  Continue current dose of Triumeq.  Social determinants of health reviewed.  Covered by [facility].  Plan for follow-up in 4 months or sooner if needed with lab work on the same day.

## 2023-05-04 NOTE — Assessment & Plan Note (Signed)
Arthur Lynch's lease is coming due and requesting assistance in housing. Referral placed to [facility] for evaluation and further assistance.

## 2023-05-04 NOTE — Progress Notes (Signed)
Brief Narrative   Patient ID: Arthur Lynch, male    DOB: Jan 07, 1961, 63 y.o.   MRN: [id number]  Arthur Lynch is a 63 y/o AA male diagnosed with HIV disease  in 2002 with risk factor of MSM. Initial viral load and CD4 count are unavailable. Most recent genotype from March 2020 with no medication resistance mutations. [id number] negative. History of alcoholism effecting Arthur Lynch medication adherence and now in remission.   Subjective:    Chief Complaint  Patient presents with   Follow-up    B20 Patient complains of a rash on lower right leg.  Previously prescribed triamcinolone ointment in the past and would like a refill.     HPI:  Arthur Lynch is a 63 y.o. male with HIV disease last seen on 12/20/2022 with well-controlled virus and good adherence and tolerance to Triumeq.  Viral load was undetectable with CD4 count 773.  Kidney function, liver function, electrolytes within normal ranges.  Lipid profile triglycerides 94, LDL 110, and HDL 60.  Here today for routine follow-up.  Arthur Lynch has been doing well since Arthur Lynch last office visit and continues to take Triumeq as prescribed with no adverse side effects or problems obtaining medication from the pharmacy.  Covered by [facility].  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  Housing is a challenge with lease ending soon.  Access to food is stable.  Has a rash on Arthur Lynch right lower leg that primarily occurs in the winter when it is cold and dry described as itchy and improved with triamcinolone cream previously.  Primarily on the right lower leg with no recent spread.  Condoms and site-specific STD testing offered.  Healthcare maintenance reviewed. Due for routine dental care.   Denies fevers, chills, night sweats, headaches, changes in vision, neck pain/stiffness, nausea, diarrhea, vomiting, lesions or rashes.  Lab Results  Component Value Date   CD4TCELL 47 12/20/2022   CD4TABS 773 12/20/2022    Lab Results  Component Value Date   HIV1RNAQUANT Not Detected 12/20/2022     Allergies  Allergen Reactions   Penicillins Itching    Has patient had a PCN reaction causing immediate rash, facial/tongue/throat swelling, SOB or lightheadedness with hypotension: No Has patient had a PCN reaction causing severe rash involving mucus membranes or skin necrosis: No Has patient had a PCN reaction that required hospitalization No Has patient had a PCN reaction occurring within the last 10 years: No If all of the above answers are "NO", then may proceed with Cephalosporin use.       Outpatient Medications Prior to Visit  Medication Sig Dispense Refill   abacavir-dolutegravir-lamiVUDine (TRIUMEQ) 600-50-300 MG tablet Take 1 tablet by mouth daily. 30 tablet 5   amitriptyline (ELAVIL) 25 MG tablet Take 1 tablet (25 mg total) by mouth at bedtime. 30 tablet 5   Facility-Administered Medications Prior to Visit  Medication Dose Route Frequency Provider Last Rate Last Admin   0.9 %  sodium chloride infusion  500 mL Intravenous Once Jenel Lucks, MD         Past Medical History:  Diagnosis Date   Abnormal Pap smear    Alcoholism in remission (HCC) 08/16/2015   Anemia    Atypical chest pain 10/06/2015   Cancer (HCC)    COPD (chronic obstructive pulmonary disease) (HCC)    Depression    Eczema 10/06/2015   HIV (human immunodeficiency virus infection) (HCC)    Murmur, heart  Neuromuscular disorder (HCC)    Substance abuse (HCC)      Past Surgical History:  Procedure Laterality Date   ANOSCOPY        Review of Systems  Constitutional:  Negative for appetite change, chills, fatigue, fever and unexpected weight change.  Eyes:  Negative for visual disturbance.  Respiratory:  Negative for cough, chest tightness, shortness of breath and wheezing.   Cardiovascular:  Negative for chest pain and leg swelling.  Gastrointestinal:  Negative for abdominal pain, constipation, diarrhea,  nausea and vomiting.  Genitourinary:  Negative for dysuria, flank pain, frequency, genital sores, hematuria and urgency.  Skin:  Positive for rash.  Allergic/Immunologic: Negative for immunocompromised state.  Neurological:  Negative for dizziness and headaches.      Objective:    BP 94/63   Pulse 85   Ht 6\' 1"  (1.854 m)   Wt 137 lb (62.1 kg)   BMI 18.07 kg/m  Nursing note and vital signs reviewed.  Physical Exam Constitutional:      General: Arthur Lynch is not in acute distress.    Appearance: Arthur Lynch is well-developed.  Cardiovascular:     Rate and Rhythm: Normal rate and regular rhythm.     Heart sounds: Normal heart sounds.  Pulmonary:     Effort: Pulmonary effort is normal.     Breath sounds: Normal breath sounds.  Skin:    General: Skin is warm and dry.  Neurological:     Mental Status: Arthur Lynch is alert and oriented to person, place, and time.  Psychiatric:        Behavior: Behavior normal.        Thought Content: Thought content normal.        Judgment: Judgment normal.         05/04/2023    9:00 AM 12/20/2022    2:16 PM 10/04/2022   11:01 AM 03/14/2022    3:55 PM 10/25/2021    1:49 PM  Depression screen PHQ 2/9  Decreased Interest 0 0 0 0 0  Down, Depressed, Hopeless 0 0 1 0 0  PHQ - 2 Score 0 0 1 0 0  Altered sleeping   3    Tired, decreased energy   2    Change in appetite   3    Feeling bad or failure about yourself    0    Trouble concentrating   0    Moving slowly or fidgety/restless   0    Suicidal thoughts   0    PHQ-9 Score   9         Assessment & Plan:    Patient Active Problem List   Diagnosis Date Noted   Infection of index finger, right 01/19/2021   Stenosing tenosynovitis of finger 01/17/2021   Viral gastroenteritis 09/16/2018   Healthcare maintenance 07/02/2018   Infection of lip 07/28/2016   Infected lip laceration 07/28/2016   Alcohol dependence with acute alcoholic intoxication (HCC) 07/25/2016   Major depressive disorder, recurrent severe  without psychotic features (HCC) 07/25/2016   MDD (major depressive disorder) 07/25/2016   Atypical chest pain 10/06/2015   Eczema 10/06/2015   Alcoholism in remission (HCC) 08/16/2015   HIV disease (HCC) 06/04/2014   HZV (herpes zoster virus) post herpetic neuralgia 06/04/2014   Rash and nonspecific skin eruption 05/11/2014   Rib pain on left side 10/15/2013   Abnormal Pap smear    ASCUS (atypical squamous cells of undetermined significance) on Pap smear 07/24/2012   Insomnia 12/20/2011  Anemia 06/21/2011   Gynecomastia 05/31/2011   Zoster ophthalmicus 05/31/2011   Headache 02/15/2011   Neuropathic pain 11/02/2010   Cyst on ear 11/02/2010   Acute gastritis 12/29/2009   Constipation 12/29/2009   Alcohol dependence with alcohol-induced mood disorder (HCC) 06/07/2009   Herpes zoster 12/07/2008   NEURALGIA, TRIGEMINAL 12/07/2008   Deficiency anemia 11/23/2008   POLYURIA 11/23/2008   NOCTURIA 11/23/2008   GYNECOMASTIA 09/21/2008   Carbuncle and furuncle 06/19/2008   Leukocytopenia 03/16/2008   ROSACEA 07/22/2007   EDEMA 07/22/2007   PAROXYSMAL NOCTURNAL DYSPNEA 04/01/2007   Pruritic disorder 01/24/2007   DIARRHEA 01/24/2007   Major depressive disorder with current active episode 12/10/2006   Human immunodeficiency virus (HIV) disease (HCC) 01/13/2006     Problem List Items Addressed This Visit       Musculoskeletal and Integument   Eczema   Right lower extremity consistent with eczema.  Continue moisturization with Marice Potter, Aveeno, Eucerin products.  Refill triamcinolone cream to use as needed.        Other   Insomnia   Insomnia stable and has been off of amitriptyline recently and requesting refills as Arthur Lynch sleeps better with medication.  No adverse side effects.  Sleep hygiene encouraged.  Continue current dose of amitriptyline.      Relevant Medications   amitriptyline (ELAVIL) 25 MG tablet   HIV disease (HCC) - Primary   Arthur Lynch continues to have well-controlled  virus with good adherence and tolerance to Triumeq.  Reviewed previous lab work and discussed plan of care and U equals U.  Check blood work.  Continue current dose of Triumeq.  Social determinants of health reviewed.  Covered by [facility].  Plan for follow-up in 4 months or sooner if needed with lab work on the same day.      Relevant Medications   abacavir-dolutegravir-lamiVUDine (TRIUMEQ) 600-50-300 MG tablet   Other Relevant Orders   COMPLETE METABOLIC PANEL WITH GFR   CBC   HIV-1 RNA quant-no reflex-bld   T-helper cells (CD4) count (not at [facility])   Healthcare maintenance   Discussed importance of safe sexual practice and condom use. Condoms and site specific STD testing offered.  Vaccinations reviewed and declined today. Due for routine dental care with appointment contact information in AVS.  Colon cancer screening up to date.  Anal pap smear due at next visit.         I am having Arthur Lynch start on triamcinolone cream. I am also having him maintain Arthur Lynch Triumeq and amitriptyline. We will continue to administer sodium chloride.   Meds ordered this encounter  Medications   triamcinolone cream (KENALOG) 0.1 %    Sig: Apply topically 2 (two) times daily.    Dispense:  30 g    Refill:  2    Supervising Provider:   Judyann Munson [[id number]]   abacavir-dolutegravir-lamiVUDine (TRIUMEQ) 600-50-300 MG tablet    Sig: Take 1 tablet by mouth daily.    Dispense:  30 tablet    Refill:  5    Please mail    Supervising Provider:   Judyann Munson [phone]    Prescription Type::   Renewal   amitriptyline (ELAVIL) 25 MG tablet    Sig: Take 1 tablet (25 mg total) by mouth at bedtime.    Dispense:  30 tablet    Refill:  5    Please mail.    Supervising Provider:   Judyann Munson [phone]     Follow-up: Return in about 4 months (  around 09/01/2023). or sooner if needed.    Marcos Eke, MSN, FNP-C Nurse Practitioner [facility] for Infectious Disease [facility] Main number: [phone]

## 2023-05-08 LAB — CBC
HCT: 38.9 % (ref 38.5–50.0)
Hemoglobin: 12.9 g/dL — ABNORMAL LOW (ref 13.2–17.1)
MCH: 33.9 pg — ABNORMAL HIGH (ref 27.0–33.0)
MCHC: 33.2 g/dL (ref 32.0–36.0)
MCV: 102.1 fL — ABNORMAL HIGH (ref 80.0–100.0)
MPV: 10 fL (ref 7.5–12.5)
Platelets: 227 10*3/uL (ref 140–400)
RBC: 3.81 10*6/uL — ABNORMAL LOW (ref 4.20–5.80)
RDW: 12 % (ref 11.0–15.0)
WBC: 3.2 10*3/uL — ABNORMAL LOW (ref 3.8–10.8)

## 2023-05-08 LAB — COMPLETE METABOLIC PANEL WITH GFR
AG Ratio: 1.5 (calc) (ref 1.0–2.5)
ALT: 13 U/L (ref 9–46)
AST: 20 U/L (ref 10–35)
Albumin: 4.5 g/dL (ref 3.6–5.1)
Alkaline phosphatase (APISO): 59 U/L (ref 35–144)
BUN: 16 mg/dL (ref 7–25)
CO2: 29 mmol/L (ref 20–32)
Calcium: 9.9 mg/dL (ref 8.6–10.3)
Chloride: 105 mmol/L (ref 98–110)
Creat: 1.08 mg/dL (ref 0.70–1.35)
Globulin: 3.1 g/dL (ref 1.9–3.7)
Glucose, Bld: 93 mg/dL (ref 65–99)
Potassium: 4.7 mmol/L (ref 3.5–5.3)
Sodium: 140 mmol/L (ref 135–146)
Total Bilirubin: 0.6 mg/dL (ref 0.2–1.2)
Total Protein: 7.6 g/dL (ref 6.1–8.1)
eGFR: 79 mL/min/{1.73_m2} (ref >=60)

## 2023-05-08 LAB — HIV-1 RNA QUANT-NO REFLEX-BLD
HIV 1 RNA Quant: 22 {copies}/mL — ABNORMAL HIGH
HIV-1 RNA Quant, Log: 1.34 {Log} — ABNORMAL HIGH

## 2023-05-08 LAB — T-HELPER CELLS (CD4) COUNT (NOT AT ARMC)
Absolute CD4: 778 {cells}/uL (ref 490–1740)
CD4 T Helper %: 41 % (ref 30–61)
Total lymphocyte count: 1915 {cells}/uL (ref 850–3900)

## 2023-05-10 ENCOUNTER — Other Ambulatory Visit (HOSPITAL_COMMUNITY): Payer: Self-pay

## 2023-05-11 ENCOUNTER — Other Ambulatory Visit: Payer: Self-pay

## 2023-05-11 ENCOUNTER — Other Ambulatory Visit (HOSPITAL_COMMUNITY): Payer: Self-pay

## 2023-05-11 NOTE — Progress Notes (Signed)
 Specialty Pharmacy Refill Coordination Note  Arthur Lynch is a 63 y.o. male contacted today regarding refills of specialty medication(s) Abacavir-Dolutegravir-Lamivud [name])   Patient requested Delivery   Delivery date: 05/15/23   Verified address: [address]   [place] [address]   Medication will be filled on 05/14/23.

## 2023-05-14 ENCOUNTER — Other Ambulatory Visit: Payer: Self-pay

## 2023-05-17 ENCOUNTER — Other Ambulatory Visit (HOSPITAL_COMMUNITY): Payer: Self-pay

## 2023-05-29 ENCOUNTER — Other Ambulatory Visit: Payer: Self-pay

## 2023-06-08 ENCOUNTER — Other Ambulatory Visit: Payer: Self-pay

## 2023-06-11 ENCOUNTER — Other Ambulatory Visit: Payer: Self-pay

## 2023-06-14 ENCOUNTER — Other Ambulatory Visit: Payer: Self-pay

## 2023-06-14 NOTE — Progress Notes (Signed)
 Specialty Pharmacy Refill Coordination Note  Arthur Lynch is a 63 y.o. male contacted today regarding refills of specialty medication(s) Abacavir-Dolutegravir-Lamivud [name])   Patient requested Delivery   Delivery date: 06/18/23   Verified address: [address]   [place] [address]   Medication will be filled on 06/15/23.

## 2023-06-15 ENCOUNTER — Other Ambulatory Visit: Payer: Self-pay

## 2023-06-19 ENCOUNTER — Other Ambulatory Visit: Payer: Self-pay

## 2023-06-19 ENCOUNTER — Ambulatory Visit: Payer: MEDICAID

## 2023-07-05 ENCOUNTER — Other Ambulatory Visit: Payer: Self-pay

## 2023-07-11 ENCOUNTER — Other Ambulatory Visit: Payer: Self-pay

## 2023-07-11 NOTE — Progress Notes (Signed)
 Specialty Pharmacy Refill Coordination Note  Arthur Lynch is a 63 y.o. male contacted today regarding refills of specialty medication(s) Abacavir -Dolutegravir -Lamivud (Triumeq )   Patient requested Delivery   Delivery date: 07/16/23   Verified address: [address] [place] [address]   Medication will be filled on 07/13/2023 .

## 2023-07-11 NOTE — Progress Notes (Signed)
 Specialty Pharmacy Ongoing Clinical Assessment Note  Arthur Lynch is a 63 y.o. male who is being followed by the specialty pharmacy service for RxSp HIV   Patient's specialty medication(s) reviewed today: Abacavir -Dolutegravir -Lamivud (Triumeq )   Missed doses in the last 4 weeks: 2   Patient/Caregiver did not have any additional questions or concerns.   Therapeutic benefit summary: Patient is achieving benefit   Adverse events/side effects summary: No adverse events/side effects   Patient's therapy is appropriate to: Continue    Goals Addressed             This Visit's Progress    Achieve Undetectable HIV Viral Load < 20   On track    Patient is on track. Patient will maintain adherence      Increase CD4 count until steady state   On track    Patient is on track. Patient will maintain adherence      Maintain optimal adherence to therapy   On track    Patient is on track. Patient will maintain adherence         Follow up:  6 months  [facility] Specialty Pharmacist

## 2023-07-13 ENCOUNTER — Other Ambulatory Visit: Payer: Self-pay

## 2023-07-31 NOTE — Progress Notes (Signed)
 The 10-year ASCVD risk score (Arnett DK, et al., 2019) is: 4.5%   Values used to calculate the score:     Age: 63 years     Sex: Male     Is Non-Hispanic African American: Yes     Diabetic: No     Tobacco smoker: No     Systolic Blood Pressure: 94 mmHg     Is BP treated: No     HDL Cholesterol: 60 mg/dL     Total Cholesterol: 190 mg/dL  ASCVD <1%.  Merrilyn Legler, BSN, RN

## 2023-08-02 ENCOUNTER — Other Ambulatory Visit: Payer: Self-pay

## 2023-08-02 NOTE — Progress Notes (Signed)
 Specialty Pharmacy Refill Coordination Note  Arthur Lynch is a 63 y.o. male contacted today regarding refills of specialty medication(s) Abacavir -Dolutegravir -Lamivud (Triumeq )   Patient requested Delivery   Delivery date: 08/08/23   Verified address: [address]   [place] [address]   Medication will be filled on 08/07/23.

## 2023-08-16 ENCOUNTER — Ambulatory Visit: Payer: MEDICAID

## 2023-08-28 ENCOUNTER — Ambulatory Visit: Payer: MEDICAID

## 2023-08-30 ENCOUNTER — Other Ambulatory Visit: Payer: Self-pay

## 2023-08-31 ENCOUNTER — Ambulatory Visit: Payer: MEDICAID | Admitting: Family

## 2023-09-05 ENCOUNTER — Other Ambulatory Visit: Payer: Self-pay

## 2023-09-07 ENCOUNTER — Other Ambulatory Visit: Payer: Self-pay

## 2023-09-07 ENCOUNTER — Other Ambulatory Visit: Payer: Self-pay | Admitting: Pharmacy Technician

## 2023-09-07 NOTE — Progress Notes (Signed)
 Specialty Pharmacy Refill Coordination Note  Arthur Lynch is a 63 y.o. male contacted today regarding refills of specialty medication(s) Abacavir -Dolutegravir -Lamivud (Triumeq )   Patient requested Delivery   Delivery date: 09/10/23   Verified address: [address]  [place]   Medication will be filled on 09/07/23.

## 2023-09-18 ENCOUNTER — Ambulatory Visit: Payer: MEDICAID

## 2023-09-25 ENCOUNTER — Ambulatory Visit: Payer: MEDICAID | Admitting: Family

## 2023-09-27 ENCOUNTER — Encounter: Payer: Self-pay | Admitting: Family

## 2023-09-27 ENCOUNTER — Other Ambulatory Visit: Payer: Self-pay

## 2023-09-27 ENCOUNTER — Ambulatory Visit: Payer: MEDICAID | Admitting: Family

## 2023-09-27 VITALS — BP 103/75 | HR 75 | Temp 98.0°F | Ht 73.0 in | Wt 138.0 lb

## 2023-09-27 DIAGNOSIS — Z0000 Encounter for general adult medical examination without abnormal findings: Secondary | ICD-10-CM

## 2023-09-27 DIAGNOSIS — G4700 Insomnia, unspecified: Secondary | ICD-10-CM | POA: Diagnosis not present

## 2023-09-27 DIAGNOSIS — B20 Human immunodeficiency virus [HIV] disease: Secondary | ICD-10-CM | POA: Diagnosis not present

## 2023-09-27 DIAGNOSIS — Z79899 Other long term (current) drug therapy: Secondary | ICD-10-CM

## 2023-09-27 MED ORDER — TRIUMEQ 600-50-300 MG PO TABS
1.0000 | ORAL_TABLET | Freq: Every day | ORAL | 5 refills | Status: DC
  Filled 2023-09-27 – 2023-10-04 (×2): qty 30, 30d supply, fill #0
  Filled 2023-10-31: qty 30, 30d supply, fill #1
  Filled 2023-11-28 – 2023-12-24 (×2): qty 30, 30d supply, fill #2

## 2023-09-27 MED ORDER — AMITRIPTYLINE HCL 25 MG PO TABS
25.0000 mg | ORAL_TABLET | Freq: Every day | ORAL | 5 refills | Status: DC
  Filled 2023-09-27 – 2023-12-24 (×4): qty 30, 30d supply, fill #0

## 2023-09-27 NOTE — Assessment & Plan Note (Signed)
 Discussed importance of safe sexual practice and condom use. Condoms and site specific STD testing offered.  Vaccinations reviewed and due for Shingrix at his convenience.  Colon cancer screening up to date. Due for anal cancer screening and deferred today.

## 2023-09-27 NOTE — Patient Instructions (Addendum)
 Nice to see you.  We will check your lab work today.  Continue to take your medication daily as prescribed.  Refills have been sent to the pharmacy.  Plan for follow up in 4 months or sooner if needed with lab work on the same day.  Have a great day and stay safe!

## 2023-09-27 NOTE — Assessment & Plan Note (Signed)
 Arthur Lynch continues to use amitriptyline  nightly for sleep.  Discussed importance of good sleep hygiene.  Most recent changes related to current situation creased stress.  Following discussion we will continue with current dose of amitriptyline  for now if symptoms continue or do not improve we will consider change to 50 mg.

## 2023-09-27 NOTE — Assessment & Plan Note (Signed)
 Mr. Alesi continues to have well-controlled virus with good adherence and tolerance to Triumeq .  Reviewed previous lab work and discussed plan of care and usual reviewed.  No problems obtaining medication from the pharmacy and covered by [facility].  Social determinants of health reviewed with no interventions indicated.  Check blood work.  Continue current dose of Triumeq .  Plan for follow-up in 4 months or sooner if needed with lab work on the same day.

## 2023-09-27 NOTE — Progress Notes (Signed)
 Brief Narrative   Patient ID: Arthur Lynch, male    DOB: 02/02/1961, 63 y.o.   MRN: [id number]  Arthur Lynch is a 63 y/o AA male diagnosed with HIV disease  in 2002 with risk factor of MSM. Initial viral load and CD4 count are unavailable. Most recent genotype from March 2020 with no medication resistance mutations. [id number] negative. History of alcoholism effecting his medication adherence and now in remission.   Subjective:   Chief Complaint  Patient presents with   Follow-up    B20, Requesting to increase amitriptyline  dose for insomnia    HPI:  Arthur Lynch is a 63 y.o. male with HIV disease last seen on 05/04/2023 with well-controlled virus and good adherence and tolerance to Triumeq .  Viral load was undetectable with CD4 count 778.  Kidney function, liver function, electrolytes within normal ranges.  Insomnia continue to be well treated with amitriptyline .  Here today for routine follow-up.  Arthur Lynch has been doing okay since his last office visit and continues to take Biktarvy and amitriptyline  as prescribed with no adverse side effects or problems obtaining medication from the pharmacy. Covered by [facility]. Continues to work full time and is in a good place overall. Has had some issues with a client that he has worked through and learned from. Congratulated on his continued sobriety.  Housing, transportation, and access to food are stable.  Not currently sexually active.  Healthcare maintenance reviewed.  Having some difficulty staying asleep at night and has concerns about temperature and the previous situation.  Questioning if increased dose of amitriptyline  would help.  Denies fevers, chills, night sweats, headaches, changes in vision, neck pain/stiffness, nausea, diarrhea, vomiting, lesions or rashes.  Lab Results  Component Value Date   CD4TCELL 41 05/04/2023   CD4TABS 773 12/20/2022   Lab Results  Component Value Date   HIV1RNAQUANT 22 (H) 05/04/2023      Allergies  Allergen Reactions   Penicillins Itching    Has patient had a PCN reaction causing immediate rash, facial/tongue/throat swelling, SOB or lightheadedness with hypotension: No Has patient had a PCN reaction causing severe rash involving mucus membranes or skin necrosis: No Has patient had a PCN reaction that required hospitalization No Has patient had a PCN reaction occurring within the last 10 years: No If all of the above answers are NO, then may proceed with Cephalosporin use.       Outpatient Medications Prior to Visit  Medication Sig Dispense Refill   triamcinolone  cream (KENALOG ) 0.1 % Apply topically 2 (two) times daily. 30 g 2   abacavir -dolutegravir -lamiVUDine  (TRIUMEQ ) 600-50-300 MG tablet Take 1 tablet by mouth daily. 30 tablet 5   amitriptyline  (ELAVIL ) 25 MG tablet Take 1 tablet (25 mg total) by mouth at bedtime. 30 tablet 5   Facility-Administered Medications Prior to Visit  Medication Dose Route Frequency Provider Last Rate Last Admin   0.9 %  sodium chloride  infusion  500 mL Intravenous Once Stacia Glendia BRAVO, MD         Past Medical History:  Diagnosis Date   Abnormal Pap smear    Alcoholism in remission (HCC) 08/16/2015   Anemia    Atypical chest pain 10/06/2015   Cancer (HCC)    COPD (chronic obstructive pulmonary disease) (HCC)    Depression    Eczema 10/06/2015   HIV (human immunodeficiency virus infection) (HCC)    Murmur, heart    Neuromuscular disorder (HCC)    Substance abuse (HCC)  Past Surgical History:  Procedure Laterality Date   ANOSCOPY          Review of Systems  Constitutional:  Negative for appetite change, chills, fatigue, fever and unexpected weight change.  Eyes:  Negative for visual disturbance.  Respiratory:  Negative for cough, chest tightness, shortness of breath and wheezing.   Cardiovascular:  Negative for chest pain and leg swelling.  Gastrointestinal:  Negative for abdominal pain, constipation,  diarrhea, nausea and vomiting.  Genitourinary:  Negative for dysuria, flank pain, frequency, genital sores, hematuria and urgency.  Skin:  Negative for rash.  Allergic/Immunologic: Negative for immunocompromised state.  Neurological:  Negative for dizziness and headaches.     Objective:   BP 103/75   Pulse 75   Temp 98 F (36.7 C) (Temporal)   Ht 6' 1 (1.854 m)   Wt 138 lb (62.6 kg)   SpO2 98%   BMI 18.21 kg/m  Nursing note and vital signs reviewed.  Physical Exam Constitutional:      General: He is not in acute distress.    Appearance: He is well-developed.  Eyes:     Conjunctiva/sclera: Conjunctivae normal.  Cardiovascular:     Rate and Rhythm: Normal rate and regular rhythm.     Heart sounds: Normal heart sounds. No murmur heard.    No friction rub. No gallop.  Pulmonary:     Effort: Pulmonary effort is normal. No respiratory distress.     Breath sounds: Normal breath sounds. No wheezing or rales.  Chest:     Chest wall: No tenderness.  Abdominal:     General: Bowel sounds are normal.     Palpations: Abdomen is soft.     Tenderness: There is no abdominal tenderness.  Musculoskeletal:     Cervical back: Neck supple.  Lymphadenopathy:     Cervical: No cervical adenopathy.  Skin:    General: Skin is warm and dry.     Findings: No rash.  Neurological:     Mental Status: He is alert and oriented to person, place, and time.  Psychiatric:        Behavior: Behavior normal.        Thought Content: Thought content normal.        Judgment: Judgment normal.          05/04/2023    9:00 AM 12/20/2022    2:16 PM 10/04/2022   11:01 AM 03/14/2022    3:55 PM 10/25/2021    1:49 PM  Depression screen PHQ 2/9  Decreased Interest 0 0 0 0 0  Down, Depressed, Hopeless 0 0 1 0 0  PHQ - 2 Score 0 0 1 0 0  Altered sleeping   3    Tired, decreased energy   2    Change in appetite   3    Feeling bad or failure about yourself    0    Trouble concentrating   0    Moving slowly  or fidgety/restless   0    Suicidal thoughts   0    PHQ-9 Score   9           No data to display           The 10-year ASCVD risk score (Arnett DK, et al., 2019) is: 5.2%   Values used to calculate the score:     Age: 63 years     Clincally relevant sex: Male     Is Non-Hispanic African American: Yes  Diabetic: No     Tobacco smoker: No     Systolic Blood Pressure: 103 mmHg     Is BP treated: No     HDL Cholesterol: 60 mg/dL     Total Cholesterol: 190 mg/dL      Assessment & Plan:    Patient Active Problem List   Diagnosis Date Noted   Housing instability 05/04/2023   Infection of index finger, right 01/19/2021   Stenosing tenosynovitis of finger 01/17/2021   Viral gastroenteritis 09/16/2018   Healthcare maintenance 07/02/2018   Infection of lip 07/28/2016   Infected lip laceration 07/28/2016   Alcohol dependence with acute alcoholic intoxication (HCC) 07/25/2016   Major depressive disorder, recurrent severe without psychotic features (HCC) 07/25/2016   MDD (major depressive disorder) 07/25/2016   Atypical chest pain 10/06/2015   Eczema 10/06/2015   Alcoholism in remission (HCC) 08/16/2015   HIV disease (HCC) 06/04/2014   HZV (herpes zoster virus) post herpetic neuralgia 06/04/2014   Rash and nonspecific skin eruption 05/11/2014   Rib pain on left side 10/15/2013   Abnormal Pap smear    ASCUS (atypical squamous cells of undetermined significance) on Pap smear 07/24/2012   Insomnia 12/20/2011   Anemia 06/21/2011   Gynecomastia 05/31/2011   Zoster ophthalmicus 05/31/2011   Headache 02/15/2011   Neuropathic pain 11/02/2010   Cyst on ear 11/02/2010   Acute gastritis 12/29/2009   Constipation 12/29/2009   Alcohol dependence with alcohol-induced mood disorder (HCC) 06/07/2009   Herpes zoster 12/07/2008   NEURALGIA, TRIGEMINAL 12/07/2008   Deficiency anemia 11/23/2008   POLYURIA 11/23/2008   NOCTURIA 11/23/2008   GYNECOMASTIA 09/21/2008   Carbuncle and  furuncle 06/19/2008   Leukocytopenia 03/16/2008   ROSACEA 07/22/2007   EDEMA 07/22/2007   PAROXYSMAL NOCTURNAL DYSPNEA 04/01/2007   Pruritic disorder 01/24/2007   DIARRHEA 01/24/2007   Major depressive disorder with current active episode 12/10/2006   Human immunodeficiency virus (HIV) disease (HCC) 01/13/2006     Problem List Items Addressed This Visit       Other   Insomnia   Arthur Lynch continues to use amitriptyline  nightly for sleep.  Discussed importance of good sleep hygiene.  Most recent changes related to current situation creased stress.  Following discussion we will continue with current dose of amitriptyline  for now if symptoms continue or do not improve we will consider change to 50 mg.      Relevant Medications   amitriptyline  (ELAVIL ) 25 MG tablet   HIV disease (HCC) - Primary   Arthur Lynch continues to have well-controlled virus with good adherence and tolerance to Triumeq .  Reviewed previous lab work and discussed plan of care and usual reviewed.  No problems obtaining medication from the pharmacy and covered by [facility].  Social determinants of health reviewed with no interventions indicated.  Check blood work.  Continue current dose of Triumeq .  Plan for follow-up in 4 months or sooner if needed with lab work on the same day.      Relevant Medications   abacavir -dolutegravir -lamiVUDine  (TRIUMEQ ) 600-50-300 MG tablet   Other Relevant Orders   Comprehensive metabolic panel with GFR   HIV-1 RNA quant-no reflex-bld   T-helper cell (CD4)- ([facility] clinic only)   Healthcare maintenance   Discussed importance of safe sexual practice and condom use. Condoms and site specific STD testing offered.  Vaccinations reviewed and due for Shingrix at his convenience.  Colon cancer screening up to date. Due for anal cancer screening and deferred today.  Other Visit Diagnoses       Pharmacologic therapy       Relevant Orders   Lipid panel        I am having Arthur Lynch maintain his triamcinolone  cream, Triumeq , and amitriptyline . We will continue to administer sodium chloride .   Meds ordered this encounter  Medications   abacavir -dolutegravir -lamiVUDine  (TRIUMEQ ) 600-50-300 MG tablet    Sig: Take 1 tablet by mouth daily.    Dispense:  30 tablet    Refill:  5    Please mail    Supervising Provider:   LUIZ CHANNEL [phone]    Prescription Type::   Renewal   amitriptyline  (ELAVIL ) 25 MG tablet    Sig: Take 1 tablet (25 mg total) by mouth at bedtime.    Dispense:  30 tablet    Refill:  5    Please mail.    Supervising Provider:   LUIZ CHANNEL [phone]     Follow-up: Return in about 4 months (around 01/28/2024). or sooner if needed.    Cathlyn July, MSN, FNP-C Nurse Practitioner [facility] for Infectious Disease [facility] Main number: [phone]

## 2023-09-28 LAB — T-HELPER CELL (CD4) - (RCID CLINIC ONLY)
CD4 % Helper T Cell: 46 % (ref 33–65)
CD4 T Cell Abs: 926 /uL (ref 400–1790)

## 2023-09-30 LAB — LIPID PANEL
Cholesterol: 178 mg/dL (ref <200)
HDL: 64 mg/dL (ref >=40)
LDL Cholesterol (Calc): 99 mg/dL
Non-HDL Cholesterol (Calc): 114 mg/dL (ref <130)
Total CHOL/HDL Ratio: 2.8 (calc) (ref <5.0)
Triglycerides: 64 mg/dL (ref <150)

## 2023-09-30 LAB — COMPREHENSIVE METABOLIC PANEL WITH GFR
AG Ratio: 1.6 (calc) (ref 1.0–2.5)
ALT: 12 U/L (ref 9–46)
AST: 22 U/L (ref 10–35)
Albumin: 4.4 g/dL (ref 3.6–5.1)
Alkaline phosphatase (APISO): 52 U/L (ref 35–144)
BUN: 15 mg/dL (ref 7–25)
CO2: 25 mmol/L (ref 20–32)
Calcium: 9.3 mg/dL (ref 8.6–10.3)
Chloride: 106 mmol/L (ref 98–110)
Creat: 1.07 mg/dL (ref 0.70–1.35)
Globulin: 2.8 g/dL (ref 1.9–3.7)
Glucose, Bld: 77 mg/dL (ref 65–99)
Potassium: 4 mmol/L (ref 3.5–5.3)
Sodium: 140 mmol/L (ref 135–146)
Total Bilirubin: 0.5 mg/dL (ref 0.2–1.2)
Total Protein: 7.2 g/dL (ref 6.1–8.1)
eGFR: 79 mL/min/1.73m2 (ref >=60)

## 2023-09-30 LAB — HIV-1 RNA QUANT-NO REFLEX-BLD
HIV 1 RNA Quant: 25 {copies}/mL — ABNORMAL HIGH
HIV-1 RNA Quant, Log: 1.4 {Log_copies}/mL — ABNORMAL HIGH

## 2023-10-01 ENCOUNTER — Ambulatory Visit: Payer: Self-pay | Admitting: Family

## 2023-10-04 ENCOUNTER — Other Ambulatory Visit: Payer: Self-pay

## 2023-10-04 ENCOUNTER — Other Ambulatory Visit (HOSPITAL_COMMUNITY): Payer: Self-pay

## 2023-10-08 ENCOUNTER — Other Ambulatory Visit: Payer: Self-pay

## 2023-10-08 NOTE — Progress Notes (Signed)
 Specialty Pharmacy Refill Coordination Note  Arthur Lynch is a 63 y.o. male contacted today regarding refills of specialty medication(s) Abacavir -Dolutegravir -Lamivud (Triumeq )   Patient requested Delivery   Delivery date: 10/09/23   Verified address: [address]  [place]   Medication will be filled on 07.28.25.

## 2023-10-11 ENCOUNTER — Other Ambulatory Visit: Payer: Self-pay

## 2023-10-25 ENCOUNTER — Other Ambulatory Visit: Payer: Self-pay

## 2023-10-31 ENCOUNTER — Other Ambulatory Visit (HOSPITAL_COMMUNITY): Payer: Self-pay

## 2023-10-31 ENCOUNTER — Other Ambulatory Visit: Payer: Self-pay

## 2023-10-31 ENCOUNTER — Encounter: Payer: Self-pay | Admitting: Pharmacist

## 2023-10-31 ENCOUNTER — Other Ambulatory Visit: Payer: Self-pay | Admitting: Pharmacy Technician

## 2023-10-31 NOTE — Progress Notes (Signed)
 Specialty Pharmacy Refill Coordination Note  Arthur Lynch is a 63 y.o. male contacted today regarding refills of specialty medication(s) Abacavir -Dolutegravir -Lamivud (Triumeq )   Patient requested Delivery   Delivery date: 11/02/23   Verified address: [address]  [place]   Medication will be filled on 11/01/23.

## 2023-11-05 ENCOUNTER — Other Ambulatory Visit: Payer: Self-pay

## 2023-11-14 ENCOUNTER — Ambulatory Visit: Payer: MEDICAID

## 2023-11-28 ENCOUNTER — Other Ambulatory Visit: Payer: Self-pay

## 2023-11-30 ENCOUNTER — Other Ambulatory Visit: Payer: Self-pay

## 2023-12-16 ENCOUNTER — Other Ambulatory Visit: Payer: Self-pay | Admitting: Medical Genetics

## 2023-12-17 ENCOUNTER — Other Ambulatory Visit: Payer: MEDICAID

## 2023-12-24 ENCOUNTER — Other Ambulatory Visit: Payer: MEDICAID

## 2023-12-24 ENCOUNTER — Other Ambulatory Visit: Payer: Self-pay

## 2023-12-24 ENCOUNTER — Other Ambulatory Visit
Admission: RE | Admit: 2023-12-24 | Discharge: 2023-12-24 | Disposition: A | Discharge status: Home / Self Care | Payer: Self-pay | Source: Ambulatory Visit | Attending: Medical Genetics | Admitting: Medical Genetics

## 2023-12-24 ENCOUNTER — Other Ambulatory Visit (HOSPITAL_COMMUNITY): Payer: Self-pay

## 2023-12-24 ENCOUNTER — Telehealth: Payer: Self-pay

## 2023-12-24 NOTE — Telephone Encounter (Signed)
 Called patient trying to find out why Medicaid termed.  He is going to send pay stubs to see can get financial assistance, since looks to be over Medicaid limit.

## 2023-12-25 ENCOUNTER — Other Ambulatory Visit: Payer: Self-pay

## 2023-12-25 ENCOUNTER — Encounter: Payer: Self-pay | Admitting: Pharmacist

## 2023-12-28 ENCOUNTER — Other Ambulatory Visit: Payer: Self-pay

## 2024-01-01 ENCOUNTER — Other Ambulatory Visit: Payer: Self-pay

## 2024-01-15 ENCOUNTER — Ambulatory Visit: Payer: Self-pay

## 2024-01-15 ENCOUNTER — Other Ambulatory Visit: Payer: Self-pay

## 2024-01-15 LAB — GENECONNECT MOLECULAR SCREEN: Genetic Analysis Overall Interpretation: NEGATIVE

## 2024-02-04 ENCOUNTER — Ambulatory Visit: Payer: MEDICAID | Admitting: Family

## 2024-02-14 ENCOUNTER — Ambulatory Visit: Payer: MEDICAID | Admitting: Family

## 2024-03-12 ENCOUNTER — Encounter: Payer: Self-pay | Admitting: Family

## 2024-03-12 ENCOUNTER — Ambulatory Visit: Payer: Self-pay | Admitting: Family

## 2024-03-12 ENCOUNTER — Other Ambulatory Visit: Payer: Self-pay

## 2024-03-12 VITALS — BP 105/72 | HR 73 | Temp 98.0°F | Ht 73.0 in | Wt 138.0 lb

## 2024-03-12 DIAGNOSIS — G4700 Insomnia, unspecified: Secondary | ICD-10-CM

## 2024-03-12 DIAGNOSIS — Z0000 Encounter for general adult medical examination without abnormal findings: Secondary | ICD-10-CM

## 2024-03-12 DIAGNOSIS — B20 Human immunodeficiency virus [HIV] disease: Secondary | ICD-10-CM

## 2024-03-12 DIAGNOSIS — L309 Dermatitis, unspecified: Secondary | ICD-10-CM

## 2024-03-12 DIAGNOSIS — Z23 Encounter for immunization: Secondary | ICD-10-CM

## 2024-03-12 MED ORDER — TRIAMCINOLONE ACETONIDE 0.1 % EX CREA: 1.0000 | TOPICAL_CREAM | Freq: Two times a day (BID) | CUTANEOUS | 3 refills | Status: AC

## 2024-03-12 MED ORDER — AMITRIPTYLINE HCL 25 MG PO TABS: 25.0000 mg | ORAL_TABLET | Freq: Every day | ORAL | 5 refills | Status: AC

## 2024-03-12 MED ORDER — TRIUMEQ 600-50-300 MG PO TABS: 1.0000 | ORAL_TABLET | Freq: Every day | ORAL | 5 refills | Status: AC

## 2024-03-12 NOTE — Assessment & Plan Note (Signed)
 Well-controlled with current dose of triamcinolone  cream as needed.  Encouraged hydration with moisturizing cream such as Dove, Aveeno, or Eucerin products.  Continue current dose of triamcinolone  cream as needed.

## 2024-03-12 NOTE — Patient Instructions (Signed)
 Nice to see you.  We will check your lab work today.  Continue to take your medication daily as prescribed.  Refills have been sent to the pharmacy.  Plan for follow up in 4 months or sooner if needed with lab work on the same day.  Have a great day and stay safe!

## 2024-03-12 NOTE — Assessment & Plan Note (Signed)
 Mr. Mcisaac continues to have well-controlled virus with good adherence and tolerance to Triumeq .  Reviewed previous lab work and discussed plan of care and U equals U.  No problems obtaining medication from the pharmacy and covered by [facility].  Social determinants of health reviewed with no interventions indicated.  Check blood work.  Continue current dose of Triumeq .  Plan for follow-up in 4 months or sooner if needed with lab work on the same day.

## 2024-03-12 NOTE — Assessment & Plan Note (Signed)
 Well-controlled with current dose of amitriptyline  and no adverse side effects.  Sleeping adequately.  Continue current dose of amitriptyline .

## 2024-03-12 NOTE — Progress Notes (Signed)
 Patient has been signed up for [facility] and will now fill at [facility]

## 2024-03-12 NOTE — Progress Notes (Signed)
 "   Brief Narrative   Patient ID: Arthur Lynch, male    DOB: March 27, 1960, 63 y.o.   MRN: [id number]  Mr. Mayotte is a 63 y/o AA male diagnosed with HIV disease in 2002 with risk factor of MSM. Initial viral load and CD4 count are unavailable. Most recent genotype from March 2020 with no medication resistance mutations. HLAB5701 negative. History of alcoholism effecting his medication adherence and now in remission.   Subjective:   Chief Complaint  Patient presents with   Follow-up    B20    HPI:  Arthur Lynch is a 63 y.o. male with HIV disease last seen on 09/27/2023 with well-controlled virus and good adherence and tolerance to Triumeq .Viral load was undetectable with CD4 count 926.  Kidney function, liver function, electrolytes within normal ranges.  Also well-maintained on amitriptyline  to aid in sleep.  Here today for routine follow-up.  Arthur Lynch has been doing well since his last office visit and continues to take Triumeq  as prescribed with no adverse side effects or problems obtaining medication from the pharmacy.  Covered by [facility].  Continues to work full-time and now has a part-time job at [employer] with stable housing, transportation, and access to food.  He purchased a new car recently.  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  Healthcare maintenance reviewed.  Condoms and site-specific STD testing offered.  Denies fevers, chills, night sweats, headaches, changes in vision, neck pain/stiffness, nausea, diarrhea, vomiting, lesions or rashes.  Lab Results  Component Value Date   CD4TCELL 46 09/27/2023   CD4TABS 926 09/27/2023   Lab Results  Component Value Date   HIV1RNAQUANT 25 (H) 09/27/2023     Allergies[1]    Outpatient Medications Prior to Visit  Medication Sig Dispense Refill   abacavir -dolutegravir -lamiVUDine  (TRIUMEQ ) 600-50-300 MG tablet Take 1 tablet by mouth daily. 30 tablet 5   amitriptyline   (ELAVIL ) 25 MG tablet Take 1 tablet (25 mg total) by mouth at bedtime. 30 tablet 5   triamcinolone  cream (KENALOG ) 0.1 % Apply topically 2 (two) times daily. 30 g 2   Facility-Administered Medications Prior to Visit  Medication Dose Route Frequency Provider Last Rate Last Admin   0.9 %  sodium chloride  infusion  500 mL Intravenous Once Stacia Glendia BRAVO, MD         Past Medical History:  Diagnosis Date   Abnormal Pap smear    Alcoholism in remission (HCC) 08/16/2015   Anemia    Atypical chest pain 10/06/2015   Cancer (HCC)    COPD (chronic obstructive pulmonary disease) (HCC)    Depression    Eczema 10/06/2015   HIV (human immunodeficiency virus infection) (HCC)    Murmur, heart    Neuromuscular disorder (HCC)    Substance abuse (HCC)      Past Surgical History:  Procedure Laterality Date   ANOSCOPY          Review of Systems  Constitutional:  Negative for appetite change, chills, fatigue, fever and unexpected weight change.  Eyes:  Negative for visual disturbance.  Respiratory:  Negative for cough, chest tightness, shortness of breath and wheezing.   Cardiovascular:  Negative for chest pain and leg swelling.  Gastrointestinal:  Negative for abdominal pain, constipation, diarrhea, nausea and vomiting.  Genitourinary:  Negative for dysuria, flank pain, frequency, genital sores, hematuria and urgency.  Skin:  Negative for rash.  Allergic/Immunologic: Negative for immunocompromised state.  Neurological:  Negative for dizziness and  headaches.     Objective:   BP 105/72   Pulse 73   Temp 98 F (36.7 C) (Oral)   Ht 6' 1 (1.854 m)   Wt 138 lb (62.6 kg)   SpO2 98%   BMI 18.21 kg/m  Nursing note and vital signs reviewed.  Physical Exam Constitutional:      General: He is not in acute distress.    Appearance: He is well-developed.  Eyes:     Conjunctiva/sclera: Conjunctivae normal.  Cardiovascular:     Rate and Rhythm: Normal rate and regular rhythm.      Heart sounds: Normal heart sounds. No murmur heard.    No friction rub. No gallop.  Pulmonary:     Effort: Pulmonary effort is normal. No respiratory distress.     Breath sounds: Normal breath sounds. No wheezing or rales.  Chest:     Chest wall: No tenderness.  Abdominal:     General: Bowel sounds are normal.     Palpations: Abdomen is soft.     Tenderness: There is no abdominal tenderness.  Musculoskeletal:     Cervical back: Neck supple.  Lymphadenopathy:     Cervical: No cervical adenopathy.  Skin:    General: Skin is warm and dry.     Findings: No rash.  Neurological:     Mental Status: He is alert and oriented to person, place, and time.          05/04/2023    9:00 AM 12/20/2022    2:16 PM 10/04/2022   11:01 AM 03/14/2022    3:55 PM 10/25/2021    1:49 PM  Depression screen PHQ 2/9  Decreased Interest 0 0 0 0 0  Down, Depressed, Hopeless 0 0 1 0 0  PHQ - 2 Score 0 0 1 0 0  Altered sleeping   3    Tired, decreased energy   2    Change in appetite   3    Feeling bad or failure about yourself    0    Trouble concentrating   0    Moving slowly or fidgety/restless   0    Suicidal thoughts   0    PHQ-9 Score   9        Data saved with a previous flowsheet row definition         No data to display           The 10-year ASCVD risk score (Arnett DK, et al., 2019) is: 5.4%   Values used to calculate the score:     Age: 51 years     Clinically relevant sex: Male     Is Non-Hispanic African American: Yes     Diabetic: No     Tobacco smoker: No     Systolic Blood Pressure: 105 mmHg     Is BP treated: No     HDL Cholesterol: 64 mg/dL     Total Cholesterol: 178 mg/dL      Assessment & Plan:    Patient Active Problem List   Diagnosis Date Noted   Housing instability 05/04/2023   Infection of index finger, right 01/19/2021   Stenosing tenosynovitis of finger 01/17/2021   Viral gastroenteritis 09/16/2018   Healthcare maintenance 07/02/2018   Infection of lip  07/28/2016   Infected lip laceration 07/28/2016   Alcohol dependence with acute alcoholic intoxication (HCC) 07/25/2016   Major depressive disorder, recurrent severe without psychotic features (HCC) 07/25/2016   MDD (major depressive disorder)  07/25/2016   Atypical chest pain 10/06/2015   Eczema 10/06/2015   Alcoholism in remission (HCC) 08/16/2015   HIV disease (HCC) 06/04/2014   HZV (herpes zoster virus) post herpetic neuralgia 06/04/2014   Rash and nonspecific skin eruption 05/11/2014   Rib pain on left side 10/15/2013   Abnormal Pap smear    ASCUS (atypical squamous cells of undetermined significance) on Pap smear 07/24/2012   Insomnia 12/20/2011   Anemia 06/21/2011   Gynecomastia 05/31/2011   Zoster ophthalmicus 05/31/2011   Headache 02/15/2011   Neuropathic pain 11/02/2010   Cyst on ear 11/02/2010   Acute gastritis 12/29/2009   Constipation 12/29/2009   Alcohol dependence with alcohol-induced mood disorder (HCC) 06/07/2009   Herpes zoster 12/07/2008   NEURALGIA, TRIGEMINAL 12/07/2008   Deficiency anemia 11/23/2008   POLYURIA 11/23/2008   NOCTURIA 11/23/2008   GYNECOMASTIA 09/21/2008   Carbuncle and furuncle 06/19/2008   Leukocytopenia 03/16/2008   ROSACEA 07/22/2007   EDEMA 07/22/2007   PAROXYSMAL NOCTURNAL DYSPNEA 04/01/2007   Pruritic disorder 01/24/2007   DIARRHEA 01/24/2007   Major depressive disorder with current active episode 12/10/2006   Human immunodeficiency virus (HIV) disease (HCC) 01/13/2006     Problem List Items Addressed This Visit       Musculoskeletal and Integument   Eczema   Well-controlled with current dose of triamcinolone  cream as needed.  Encouraged hydration with moisturizing cream such as Dove, Aveeno, or Eucerin products.  Continue current dose of triamcinolone  cream as needed.        Other   Insomnia   Well-controlled with current dose of amitriptyline  and no adverse side effects.  Sleeping adequately.  Continue current dose of  amitriptyline .      Relevant Medications   amitriptyline  (ELAVIL ) 25 MG tablet   HIV disease [facility])   Mr. Niebuhr continues to have well-controlled virus with good adherence and tolerance to Triumeq .  Reviewed previous lab work and discussed plan of care and U equals U.  No problems obtaining medication from the pharmacy and covered by [facility].  Social determinants of health reviewed with no interventions indicated.  Check blood work.  Continue current dose of Triumeq .  Plan for follow-up in 4 months or sooner if needed with lab work on the same day.      Relevant Medications   abacavir -dolutegravir -lamiVUDine  (TRIUMEQ ) 600-50-300 MG tablet   Other Relevant Orders   Comprehensive metabolic panel with GFR   T-helper cells (CD4) count (not at [facility])   HIV-1 RNA quant-no reflex-bld   Healthcare maintenance   Discussed importance of safe sexual practice and condom use. Condoms and site specific STD testing offered.  Vaccinations reviewed and following counseling influenza and COVID-19 updated Colon cancer screening up-to-date. Anal cancer screening offered and deferred. Due for routine dental care      Other Visit Diagnoses       Encounter for immunization    -  Primary   Relevant Orders   Pfizer Comirnaty Covid-19 Vaccine 17yrs & older (Completed)     Need for influenza vaccination       Relevant Orders   Flu vaccine trivalent PF, 6mos and older(Flulaval,Afluria,Fluarix,Fluzone) (Completed)        I am having Lynwood CHARLENA Claudene maintain his Triumeq , amitriptyline , and triamcinolone  cream. We will continue to administer sodium chloride .   Meds ordered this encounter  Medications   abacavir -dolutegravir -lamiVUDine  (TRIUMEQ ) 600-50-300 MG tablet    Sig: Take 1 tablet by mouth daily.    Dispense:  30 tablet  Refill:  5    [facility]    Supervising Provider:   LUIZ CHANNEL [phone]    Prescription Type::   Renewal   amitriptyline  (ELAVIL ) 25 MG tablet    Sig: Take 1 tablet (25 mg total)  by mouth at bedtime.    Dispense:  30 tablet    Refill:  5    Supervising Provider:   SNIDER, CYNTHIA [[id number]]   triamcinolone  cream (KENALOG ) 0.1 %    Sig: Apply topically 2 (two) times daily.    Dispense:  28.4 g    Refill:  3    Supervising Provider:   LUIZ CHANNEL [[id number]]     Follow-up: Return in about 4 months (around 07/10/2024). or sooner if needed.    Cathlyn July, MSN, FNP-C Nurse Practitioner [facility] for Infectious [facility] Main number: [phone]      [1]  Allergies Allergen Reactions   Penicillins Itching    Has patient had a PCN reaction causing immediate rash, facial/tongue/throat swelling, SOB or lightheadedness with hypotension: No Has patient had a PCN reaction causing severe rash involving mucus membranes or skin necrosis: No Has patient had a PCN reaction that required hospitalization No Has patient had a PCN reaction occurring within the last 10 years: No If all of the above answers are NO, then may proceed with Cephalosporin use.    "

## 2024-03-12 NOTE — Assessment & Plan Note (Signed)
 Discussed importance of safe sexual practice and condom use. Condoms and site specific STD testing offered.  Vaccinations reviewed and following counseling influenza and COVID-19 updated Colon cancer screening up-to-date. Anal cancer screening offered and deferred. Due for routine dental care

## 2024-03-16 LAB — COMPREHENSIVE METABOLIC PANEL WITH GFR
AG Ratio: 1.4 (calc) (ref 1.0–2.5)
ALT: 13 U/L (ref 9–46)
AST: 21 U/L (ref 10–35)
Albumin: 4.6 g/dL (ref 3.6–5.1)
Alkaline phosphatase (APISO): 72 U/L (ref 35–144)
BUN: 13 mg/dL (ref 7–25)
CO2: 29 mmol/L (ref 20–32)
Calcium: 9.6 mg/dL (ref 8.6–10.3)
Chloride: 106 mmol/L (ref 98–110)
Creat: 0.87 mg/dL (ref 0.70–1.35)
Globulin: 3.3 g/dL (ref 1.9–3.7)
Glucose, Bld: 109 mg/dL — ABNORMAL HIGH (ref 65–99)
Potassium: 4.1 mmol/L (ref 3.5–5.3)
Sodium: 140 mmol/L (ref 135–146)
Total Bilirubin: 0.7 mg/dL (ref 0.2–1.2)
Total Protein: 7.9 g/dL (ref 6.1–8.1)
eGFR: 98 mL/min/1.73m2

## 2024-03-16 LAB — T-HELPER CELLS (CD4) COUNT (NOT AT ARMC)
Absolute CD4: 736 {cells}/uL (ref 490–1740)
CD4 T Helper %: 47 % (ref 30–61)
Total lymphocyte count: 1573 {cells}/uL (ref 850–3900)

## 2024-03-16 LAB — HIV-1 RNA QUANT-NO REFLEX-BLD
HIV 1 RNA Quant: NOT DETECTED {copies}/mL
HIV-1 RNA Quant, Log: NOT DETECTED {Log_copies}/mL

## 2024-03-17 ENCOUNTER — Ambulatory Visit: Payer: Self-pay | Admitting: Family

## 2024-07-03 ENCOUNTER — Ambulatory Visit: Payer: Self-pay | Admitting: Family
